# Patient Record
Sex: Male | Born: 1960 | Race: White | Hispanic: No | Marital: Single | Smoking: Never smoker
Health system: Southern US, Community
[De-identification: ages and names within clinical notes are randomized; demographics above are authoritative.]

## PROBLEM LIST (undated history)

## (undated) DIAGNOSIS — T4145XA Adverse effect of unspecified anesthetic, initial encounter: Secondary | ICD-10-CM

## (undated) DIAGNOSIS — G839 Paralytic syndrome, unspecified: Secondary | ICD-10-CM

## (undated) DIAGNOSIS — G809 Cerebral palsy, unspecified: Secondary | ICD-10-CM

## (undated) DIAGNOSIS — T8859XA Other complications of anesthesia, initial encounter: Secondary | ICD-10-CM

## (undated) DIAGNOSIS — R112 Nausea with vomiting, unspecified: Secondary | ICD-10-CM

## (undated) DIAGNOSIS — Z9889 Other specified postprocedural states: Secondary | ICD-10-CM

## (undated) HISTORY — PX: TENDON RELEASE: SHX230

---
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
## 2013-06-25 ENCOUNTER — Encounter (HOSPITAL_COMMUNITY): Payer: Self-pay | Admitting: Emergency Medicine

## 2013-06-25 ENCOUNTER — Emergency Department (HOSPITAL_COMMUNITY)
Admission: EM | Admit: 2013-06-25 | Discharge: 2013-06-26 | Disposition: A | Discharge status: Home / Self Care | Payer: Medicare Other | Attending: Emergency Medicine | Admitting: Emergency Medicine

## 2013-06-25 ENCOUNTER — Emergency Department (HOSPITAL_COMMUNITY): Payer: Medicare Other

## 2013-06-25 DIAGNOSIS — Z8669 Personal history of other diseases of the nervous system and sense organs: Secondary | ICD-10-CM | POA: Insufficient documentation

## 2013-06-25 DIAGNOSIS — R071 Chest pain on breathing: Secondary | ICD-10-CM | POA: Insufficient documentation

## 2013-06-25 DIAGNOSIS — R0789 Other chest pain: Secondary | ICD-10-CM

## 2013-06-25 DIAGNOSIS — J40 Bronchitis, not specified as acute or chronic: Secondary | ICD-10-CM

## 2013-06-25 HISTORY — DX: Cerebral palsy, unspecified: G80.9

## 2013-06-25 MED ORDER — KETOROLAC TROMETHAMINE 60 MG/2ML IM SOLN
60.0000 mg | Freq: Once | INTRAMUSCULAR | Status: AC
  Administered 2013-06-25: 60 mg via INTRAMUSCULAR
  Filled 2013-06-25: qty 2

## 2013-06-25 NOTE — ED Notes (Signed)
Pt c/o pain in his chest when he takes a deep breath. Pt has had a fever and non-productive cough.

## 2013-06-25 NOTE — ED Provider Notes (Signed)
CSN: [id number]     Arrival date & time 06/25/13  2119 History   First MD Initiated Contact with Patient 06/25/13 2306     Chief Complaint  Patient presents with  . chest congestion      (Consider location/radiation/quality/duration/timing/severity/associated sxs/prior Treatment) HPI Patient has a history of cerebral palsy and is nonverbal. He communicates with signs. He presents with a nonproductive cough x4-5 days. He's had no fevers or chills. He complains of central chest pain that is worse with deep breathing and coughing. Denies any lower sugary swelling or pain. Multiple sick family members with similar illnesses. She's taken several over-the-counter medications with no effect. Past Medical History  Diagnosis Date  . Cerebral palsy    History reviewed. No pertinent past surgical history. History reviewed. No pertinent family history. History  Substance Use Topics  . Smoking status: Never Smoker   . Smokeless tobacco: Not on file  . Alcohol Use: No    Review of Systems  Constitutional: Negative for fever and chills.  Respiratory: Positive for cough. Negative for shortness of breath and wheezing.   Cardiovascular: Positive for chest pain. Negative for palpitations and leg swelling.  Gastrointestinal: Negative for abdominal pain.  Musculoskeletal: Negative for back pain.  All other systems reviewed and are negative.     Allergies  Review of patient's allergies indicates no known allergies.  Home Medications   Prior to Admission medications   Medication Sig Start Date End Date Taking? Authorizing Provider  diphenhydrAMINE (BENADRYL) 25 mg capsule Take 25 mg by mouth every 6 (six) hours as needed.   Yes Historical Provider, MD  guaiFENesin-dextromethorphan (ROBITUSSIN DM) 100-10 MG/5ML syrup Take 10 mLs by mouth every 4 (four) hours as needed for cough.   Yes Historical Provider, MD   BP 142/101  Pulse 101  Temp(Src) 98.4 F (36.9 C) (Oral)  Resp 20  SpO2  97% Physical Exam  Nursing note and vitals reviewed. Constitutional: He appears well-developed and well-nourished. No distress.  Contracted extremities  HENT:  Head: Normocephalic and atraumatic.  Mouth/Throat: Oropharynx is clear and moist. No oropharyngeal exudate.  Eyes: EOM are normal. Pupils are equal, round, and reactive to light.  Neck: Normal range of motion. Neck supple.  Cardiovascular: Normal rate and regular rhythm.   Pulmonary/Chest: Effort normal and breath sounds normal. No respiratory distress. He has no wheezes. He has no rales. He exhibits tenderness (chest tenderness with palpation over the central chest.).  Abdominal: Soft. Bowel sounds are normal. He exhibits no distension and no mass. There is no tenderness. There is no rebound and no guarding.  Musculoskeletal: Normal range of motion. He exhibits no edema and no tenderness.  No calf swelling or tenderness.  Neurological: He is alert.  Follows commands. No focal deficit.  Skin: Skin is warm and dry. No rash noted. No erythema.  Psychiatric: He has a normal mood and affect. His behavior is normal.    ED Course  Procedures (including critical care time) Labs Review Labs Reviewed - No data to display  Imaging Review Dg Chest Portable 1 View  06/25/2013   CLINICAL DATA:  Chest congestion.  Cough.  History cerebral palsy.  EXAM: PORTABLE CHEST - 1 VIEW  COMPARISON:  None.  FINDINGS: Shallow inspiration with elevation of the right hemidiaphragm. The thoracic scoliosis convex towards the left. Normal heart size and pulmonary vascularity. No focal airspace disease or consolidation in the lungs. No blunting of costophrenic angles. No pneumothorax.  IMPRESSION: No active disease.   Electronically  Signed   By: Burman NievesWilliam  Stevens M.D.   On: 06/25/2013 23:27     EKG Interpretation None      Date: 06/26/2013  Rate: 100  Rhythm: normal sinus rhythm  QRS Axis: normal  Intervals: normal  ST/T Wave abnormalities: normal   Conduction Disutrbances:none  Narrative Interpretation:   Old EKG Reviewed: none available   MDM   Final diagnoses:  None    Patient with chest pain worse with palpation and deep breathing. He has had chest x-ray negative for evidence of pneumonia. Likely bronchitis with pleurisy versus musculoskeletal chest pain. I have very low suspicion for coronary artery disease. We'll screen with EKG.  No concerning changes on EKG. We'll treat with NSAIDs and antitussives. Return precautions given.  Loren Raceravid Reinhard Schack, MD 06/26/13 [phone]

## 2013-06-26 MED ORDER — BENZONATATE 100 MG PO CAPS: 100.0000 mg | ORAL_CAPSULE | Freq: Three times a day (TID) | ORAL | Status: DC

## 2013-06-26 MED ORDER — IBUPROFEN 600 MG PO TABS: 600.0000 mg | ORAL_TABLET | Freq: Three times a day (TID) | ORAL | Status: DC | PRN

## 2013-06-26 NOTE — ED Notes (Signed)
Pt cleaned of stool & urine. Pt placed in diaper & clean scrub pants

## 2013-06-26 NOTE — ED Notes (Signed)
Pt alert & oriented x4, stable gait. Parent given discharge instructions, paperwork & prescription(s). Parent instructed to stop at the registration desk to finish any additional paperwork. Parent verbalized understanding. Pt left department w/ no further questions. 

## 2013-06-26 NOTE — Discharge Instructions (Signed)
Bronchitis °Bronchitis is inflammation of the airways that extend from the windpipe into the lungs (bronchi). The inflammation often causes mucus to develop, which leads to a cough. If the inflammation becomes severe, it may cause shortness of breath. °CAUSES  °Bronchitis may be caused by:  °· Viral infections.   °· Bacteria.   °· Cigarette smoke.   °· Allergens, pollutants, and other irritants.   °SIGNS AND SYMPTOMS  °The most common symptom of bronchitis is a frequent cough that produces mucus. Other symptoms include: °· Fever.   °· Body aches.   °· Chest congestion.   °· Chills.   °· Shortness of breath.   °· Sore throat.   °DIAGNOSIS  °Bronchitis is usually diagnosed through a medical history and physical exam. Tests, such as chest X-rays, are sometimes done to rule out other conditions.  °TREATMENT  °You may need to avoid contact with whatever caused the problem (smoking, for example). Medicines are sometimes needed. These may include: °· Antibiotics. These may be prescribed if the condition is caused by bacteria. °· Cough suppressants. These may be prescribed for relief of cough symptoms.   °· Inhaled medicines. These may be prescribed to help open your airways and make it easier for you to breathe.   °· Steroid medicines. These may be prescribed for those with recurrent (chronic) bronchitis. °HOME CARE INSTRUCTIONS °· Get plenty of rest.   °· Drink enough fluids to keep your urine clear or pale yellow (unless you have a medical condition that requires fluid restriction). Increasing fluids may help thin your secretions and will prevent dehydration.   °· Only take over-the-counter or prescription medicines as directed by your health care provider. °· Only take antibiotics as directed. Make sure you finish them even if you start to feel better. °· Avoid secondhand smoke, irritating chemicals, and strong fumes. These will make bronchitis worse. If you are a smoker, quit smoking. Consider using nicotine gum or  skin patches to help control withdrawal symptoms. Quitting smoking will help your lungs heal faster.   °· Put a cool-mist humidifier in your bedroom at night to moisten the air. This may help loosen mucus. Change the water in the humidifier daily. You can also run the hot water in your shower and sit in the bathroom with the door closed for 5 10 minutes.   °· Follow up with your health care provider as directed.   °· Wash your hands frequently to avoid catching bronchitis again or spreading an infection to others.   °SEEK MEDICAL CARE IF: °Your symptoms do not improve after 1 week of treatment.  °SEEK IMMEDIATE MEDICAL CARE IF: °· Your fever increases. °· You have chills.   °· You have chest pain.   °· You have worsening shortness of breath.   °· You have bloody sputum. °· You faint.   °· You have lightheadedness. °· You have a severe headache.   °· You vomit repeatedly. °MAKE SURE YOU:  °· Understand these instructions. °· Will watch your condition. °· Will get help right away if you are not doing well or get worse. °Document Released: 02/10/2005 Document Revised: 12/01/2012 Document Reviewed: 10/05/2012 °Exit[employer]® Patient Information ©2014 [employer]. ° °Chest Wall Pain °Chest wall pain is pain in or around the bones and muscles of your chest. It may take up to 6 weeks to get better. It may take longer if you must stay physically active in your work and activities.  °CAUSES  °Chest wall pain may happen on its own. However, it may be caused by: °· A viral illness like the flu. °· Injury. °· Coughing. °· Exercise. °· Arthritis. °·   Fibromyalgia. °· Shingles. °HOME CARE INSTRUCTIONS  °· Avoid overtiring physical activity. Try not to strain or perform activities that cause pain. This includes any activities using your chest or your abdominal and side muscles, especially if heavy weights are used. °· Put ice on the sore area. °· Put ice in a plastic bag. °· Place a towel between your skin and the bag. °· Leave the  ice on for 15-20 minutes per hour while awake for the first 2 days. °· Only take over-the-counter or prescription medicines for pain, discomfort, or fever as directed by your caregiver. °SEEK IMMEDIATE MEDICAL CARE IF:  °· Your pain increases, or you are very uncomfortable. °· You have a fever. °· Your chest pain becomes worse. °· You have new, unexplained symptoms. °· You have nausea or vomiting. °· You feel sweaty or lightheaded. °· You have a cough with phlegm (sputum), or you cough up blood. °MAKE SURE YOU:  °· Understand these instructions. °· Will watch your condition. °· Will get help right away if you are not doing well or get worse. °Document Released: 02/10/2005 Document Revised: 05/05/2011 Document Reviewed: 10/07/2010 °ExitCare® Patient Information ©2014 [employer]. ° °

## 2014-11-24 ENCOUNTER — Encounter (HOSPITAL_COMMUNITY): Payer: Self-pay | Admitting: Emergency Medicine

## 2014-11-24 ENCOUNTER — Emergency Department (HOSPITAL_COMMUNITY)
Admission: EM | Admit: 2014-11-24 | Discharge: 2014-11-24 | Disposition: A | Discharge status: Home / Self Care | Payer: Medicare Other | Attending: Emergency Medicine | Admitting: Emergency Medicine

## 2014-11-24 DIAGNOSIS — N390 Urinary tract infection, site not specified: Secondary | ICD-10-CM | POA: Insufficient documentation

## 2014-11-24 DIAGNOSIS — R319 Hematuria, unspecified: Secondary | ICD-10-CM | POA: Diagnosis present

## 2014-11-24 DIAGNOSIS — G809 Cerebral palsy, unspecified: Secondary | ICD-10-CM | POA: Insufficient documentation

## 2014-11-24 LAB — URINALYSIS, ROUTINE W REFLEX MICROSCOPIC
Bilirubin Urine: NEGATIVE
GLUCOSE, UA: NEGATIVE mg/dL
KETONES UR: NEGATIVE mg/dL
Nitrite: POSITIVE — AB
Protein, ur: 30 mg/dL — AB
Specific Gravity, Urine: 1.015 (ref 1.005–1.030)
UROBILINOGEN UA: 0.2 mg/dL (ref 0.0–1.0)
pH: 7.5 (ref 5.0–8.0)

## 2014-11-24 LAB — URINE MICROSCOPIC-ADD ON

## 2014-11-24 MED ORDER — CEFTRIAXONE SODIUM 1 G IJ SOLR
1.0000 g | Freq: Once | INTRAMUSCULAR | Status: AC
  Administered 2014-11-24: 1 g via INTRAMUSCULAR
  Filled 2014-11-24: qty 10

## 2014-11-24 MED ORDER — LIDOCAINE HCL (PF) 1 % IJ SOLN
INTRAMUSCULAR | Status: AC
  Filled 2014-11-24: qty 5

## 2014-11-24 MED ORDER — CEPHALEXIN 500 MG PO CAPS: 500.0000 mg | ORAL_CAPSULE | Freq: Four times a day (QID) | ORAL | Status: DC

## 2014-11-24 NOTE — Discharge Instructions (Signed)

## 2014-11-24 NOTE — ED Notes (Signed)
Caregiver reports pt developed hematuria yesterday.

## 2014-11-24 NOTE — ED Provider Notes (Signed)
CSN: [id number]     Arrival date & time 11/24/14  1306 History   First MD Initiated Contact with Patient 11/24/14 1525     Chief Complaint  Patient presents with  . Hematuria     (Consider location/radiation/quality/duration/timing/severity/associated sxs/prior Treatment) HPI Comments: Brought to the emergency department by caregiver. Patient developed hematuria yesterday. He has been complaining of lower back pain. Patient has not had fever, nausea, vomiting or diarrhea.  Patient is a 54 y.o. male presenting with hematuria.  Hematuria Pertinent negatives include no abdominal pain.    Past Medical History  Diagnosis Date  . Cerebral palsy    Past Surgical History  Procedure Laterality Date  . Tendon release     Family History  Problem Relation Age of Onset  . Diabetes Mother   . Heart failure Mother   . Hypertension Mother   . Diabetes Father   . Heart failure Father   . Hypertension Father   . Cancer Father    Social History  Substance Use Topics  . Smoking status: Never Smoker   . Smokeless tobacco: None  . Alcohol Use: No    Review of Systems  Gastrointestinal: Negative for abdominal pain.  Genitourinary: Positive for hematuria.  Musculoskeletal: Positive for back pain.  All other systems reviewed and are negative.     Allergies  Review of patient's allergies indicates no known allergies.  Home Medications   Prior to Admission medications   Not on File   BP 114/77 mmHg  Pulse 69  Temp(Src) 97.5 F (36.4 C)  Resp 20  Ht  (1.651 m)  Wt 115 lb (52.164 kg)  BMI 19.14 kg/m2  SpO2 98% Physical Exam  Constitutional: He appears well-developed and well-nourished. No distress.  HENT:  Head: Normocephalic and atraumatic.  Right Ear: Hearing normal.  Left Ear: Hearing normal.  Nose: Nose normal.  Mouth/Throat: Oropharynx is clear and moist and mucous membranes are normal.  Eyes: Conjunctivae and EOM are normal. Pupils are equal, round, and  reactive to light.  Neck: Normal range of motion. Neck supple.  Cardiovascular: Regular rhythm, S1 normal and S2 normal.  Exam reveals no gallop and no friction rub.   No murmur heard. Pulmonary/Chest: Effort normal and breath sounds normal. No respiratory distress. He exhibits no tenderness.  Abdominal: Soft. Normal appearance and bowel sounds are normal. There is no hepatosplenomegaly. There is no tenderness. There is no rebound, no guarding, no tenderness at McBurney's point and negative Murphy's sign. No hernia.  Musculoskeletal:  Chronic contractures of cerebral palsy  Neurological: He is alert. He has normal strength. No cranial nerve deficit or sensory deficit. Coordination normal. GCS eye subscore is 4. GCS verbal subscore is 5. GCS motor subscore is 6.  Skin: Skin is warm, dry and intact. No rash noted. No cyanosis.  Psychiatric: He has a normal mood and affect. His speech is normal and behavior is normal. Thought content normal.  Nursing note and vitals reviewed.   ED Course  Procedures (including critical care time) Labs Review Labs Reviewed  URINALYSIS, ROUTINE W REFLEX MICROSCOPIC (NOT AT [facility]) - Abnormal; Notable for the following:    Hgb urine dipstick LARGE (*)    Protein, ur 30 (*)    Nitrite POSITIVE (*)    Leukocytes, UA LARGE (*)    All other components within normal limits  URINE MICROSCOPIC-ADD ON - Abnormal; Notable for the following:    Squamous Epithelial / LPF FEW (*)    Bacteria, UA FEW (*)  All other components within normal limits    Imaging Review No results found. I have personally reviewed and evaluated these images and lab results as part of my medical decision-making.   EKG Interpretation None      MDM   Final diagnoses:  None   UTI  Presents to the ER for evaluation of hematuria and low back pain. Patient appears well. Vital signs are normal. No fever. He has not had nausea, vomiting. Abdominal exam was benign. Urinalysis shows obvious  infection. Culture pending. Patient is to Rocephin, will continue Keflex as an outpatient.    Gilda Crease, MD 11/24/14 [phone]

## 2014-11-29 LAB — URINE CULTURE: Culture: >=100000

## 2014-11-30 ENCOUNTER — Telehealth (HOSPITAL_BASED_OUTPATIENT_CLINIC_OR_DEPARTMENT_OTHER): Payer: Self-pay | Admitting: Emergency Medicine

## 2014-11-30 NOTE — Progress Notes (Addendum)
ED Antimicrobial Stewardship Positive Culture Follow Up   [name] is an 54 y.o. male who presented to [facility] on 11/24/2014 with a chief complaint of  Chief Complaint  Patient presents with  . Hematuria    Recent Results (from the past 720 hour(s))  Urine culture     Status: None   Collection Time: 11/24/14 10:00 PM  Result Value Ref Range Status   Specimen Description URINE, CATHETERIZED  Final   Special Requests NONE  Final   Culture   Final    >=100,000 COLONIES/mL PROTEUS MIRABILIS Confirmed Extended Spectrum Beta-Lactamase Producer (ESBL) Performed at [facility]    Report Status 11/29/2014 FINAL  Final   Organism ID, Bacteria PROTEUS MIRABILIS  Final      Susceptibility   Proteus mirabilis - MIC*    AMPICILLIN 4 RESISTANT Resistant     CEFAZOLIN <=4 RESISTANT Resistant     CEFTRIAXONE <=1 RESISTANT Resistant     CIPROFLOXACIN <=0.25 SENSITIVE Sensitive     GENTAMICIN <=1 SENSITIVE Sensitive     IMIPENEM 0.5 SENSITIVE Sensitive     NITROFURANTOIN 128 RESISTANT Resistant     TRIMETH/SULFA <=20 SENSITIVE Sensitive     AMPICILLIN/SULBACTAM 4 SENSITIVE Sensitive     PIP/TAZO <=4 SENSITIVE Sensitive     * >=100,000 COLONIES/mL PROTEUS MIRABILIS     Treated with Keflex, organism resistant to prescribed antimicrobial  New antibiotic prescription: Bactrim DS 1 tab q 12 hrs x 7 days  54 y/o M with hematuria and lower back pain. UA with nitrite, large leukocytes, and few squamous. Suggestive of UTI. Grew Proteus with ESBL resistance. Pt d/c'd with Keflex. Plan to d/c Keflex and start Bactrim for proteus UTI. Will treat for longer course of 7 days due to complicated UTI due to male gender.   ED Provider: Donnetta Hutching, MD   Sandi Carne, PharmD Pharmacy Resident Pager: [phone] 11/30/2014, 9:14 AM Infectious Diseases Pharmacist Phone# [phone]

## 2014-11-30 NOTE — Telephone Encounter (Signed)
Message left with brother in law for sister, POA , to return call, urine culture + Proteus, resistant to Keflex, stop keflex, start Bactrim DS 1 tab every 12 hours x 7 days per Dr. Donnetta Hutching

## 2014-12-01 ENCOUNTER — Telehealth (HOSPITAL_COMMUNITY): Payer: Self-pay

## 2014-12-01 NOTE — Telephone Encounter (Signed)
Spoke w/caregiver informed of dx and need for addl tx. Advised to stop Keflex.  Rx for "Bactrim DS 1 tab q 12 hrs x 7 days" called into [facility] [phone].

## 2015-02-26 ENCOUNTER — Emergency Department (HOSPITAL_COMMUNITY): Payer: Medicare Other

## 2015-02-26 ENCOUNTER — Emergency Department (HOSPITAL_COMMUNITY)
Admission: EM | Admit: 2015-02-26 | Discharge: 2015-02-26 | Disposition: A | Discharge status: Home / Self Care | Payer: Medicare Other | Attending: Emergency Medicine | Admitting: Emergency Medicine

## 2015-02-26 ENCOUNTER — Encounter (HOSPITAL_COMMUNITY): Payer: Self-pay | Admitting: *Deleted

## 2015-02-26 DIAGNOSIS — Y929 Unspecified place or not applicable: Secondary | ICD-10-CM | POA: Diagnosis not present

## 2015-02-26 DIAGNOSIS — X58XXXA Exposure to other specified factors, initial encounter: Secondary | ICD-10-CM | POA: Insufficient documentation

## 2015-02-26 DIAGNOSIS — G809 Cerebral palsy, unspecified: Secondary | ICD-10-CM | POA: Diagnosis not present

## 2015-02-26 DIAGNOSIS — Y998 Other external cause status: Secondary | ICD-10-CM | POA: Diagnosis not present

## 2015-02-26 DIAGNOSIS — R509 Fever, unspecified: Secondary | ICD-10-CM | POA: Diagnosis present

## 2015-02-26 DIAGNOSIS — S42202A Unspecified fracture of upper end of left humerus, initial encounter for closed fracture: Secondary | ICD-10-CM

## 2015-02-26 DIAGNOSIS — Y939 Activity, unspecified: Secondary | ICD-10-CM | POA: Insufficient documentation

## 2015-02-26 DIAGNOSIS — S42292A Other displaced fracture of upper end of left humerus, initial encounter for closed fracture: Secondary | ICD-10-CM | POA: Diagnosis not present

## 2015-02-26 DIAGNOSIS — J069 Acute upper respiratory infection, unspecified: Secondary | ICD-10-CM | POA: Diagnosis not present

## 2015-02-26 LAB — LACTIC ACID, PLASMA
LACTIC ACID, VENOUS: 1.1 mmol/L (ref 0.5–2.0)
Lactic Acid, Venous: 0.9 mmol/L (ref 0.5–2.0)

## 2015-02-26 LAB — CBC WITH DIFFERENTIAL/PLATELET
BASOS ABS: 0 10*3/uL (ref 0.0–0.1)
BASOS PCT: 0 %
Eosinophils Absolute: 0 10*3/uL (ref 0.0–0.7)
Eosinophils Relative: 0 %
HCT: 42.5 % (ref 39.0–52.0)
HEMOGLOBIN: 13.9 g/dL (ref 13.0–17.0)
LYMPHS PCT: 7 %
Lymphs Abs: 0.7 10*3/uL (ref 0.7–4.0)
MCH: 29.8 pg (ref 26.0–34.0)
MCHC: 32.7 g/dL (ref 30.0–36.0)
MCV: 91.2 fL (ref 78.0–100.0)
MONO ABS: 1.3 10*3/uL — AB (ref 0.1–1.0)
Monocytes Relative: 14 %
NEUTROS ABS: 7.4 10*3/uL (ref 1.7–7.7)
NEUTROS PCT: 79 %
Platelets: 202 10*3/uL (ref 150–400)
RBC: 4.66 MIL/uL (ref 4.22–5.81)
RDW: 13.6 % (ref 11.5–15.5)
WBC: 9.3 10*3/uL (ref 4.0–10.5)

## 2015-02-26 LAB — COMPREHENSIVE METABOLIC PANEL
ALBUMIN: 3.7 g/dL (ref 3.5–5.0)
ALK PHOS: 75 U/L (ref 38–126)
ALT: 15 U/L — AB (ref 17–63)
AST: 20 U/L (ref 15–41)
Anion gap: 8 (ref 5–15)
BUN: 20 mg/dL (ref 6–20)
CALCIUM: 8.9 mg/dL (ref 8.9–10.3)
CO2: 27 mmol/L (ref 22–32)
CREATININE: 0.62 mg/dL (ref 0.61–1.24)
Chloride: 100 mmol/L — ABNORMAL LOW (ref 101–111)
GFR calc Af Amer: >60 mL/min (ref >60)
Glucose, Bld: 104 mg/dL — ABNORMAL HIGH (ref 65–99)
POTASSIUM: 3.8 mmol/L (ref 3.5–5.1)
Sodium: 135 mmol/L (ref 135–145)
TOTAL PROTEIN: 7.5 g/dL (ref 6.5–8.1)
Total Bilirubin: 0.6 mg/dL (ref 0.3–1.2)

## 2015-02-26 MED ORDER — DOXYCYCLINE HYCLATE 100 MG PO CAPS: 100.0000 mg | ORAL_CAPSULE | Freq: Two times a day (BID) | ORAL | Status: DC

## 2015-02-26 NOTE — ED Notes (Addendum)
Family reports cough, runny nose, fever since yesterday. Temp 100.5 in triage. Pt has cerebral palsy.

## 2015-02-26 NOTE — Discharge Instructions (Signed)

## 2015-02-26 NOTE — ED Provider Notes (Signed)
CSN: [id number]     Arrival date & time 02/26/15  1021 History   First MD Initiated Contact with Patient 02/26/15 1254     Chief Complaint  Patient presents with  . Fever   Patient is a 55 y.o. male presenting with fever and cough. The history is provided by the patient and a relative.  Fever Associated symptoms: chills and cough   Cough Cough characteristics:  Dry Severity:  Moderate Onset quality:  Gradual Duration:  1 week Timing:  Constant Progression since onset: not gettomg any better. Chronicity:  New Associated symptoms: chills and fever   Associated symptoms: no wheezing     Past Medical History  Diagnosis Date  . Cerebral palsy [facility](HCC)    Past Surgical History  Procedure Laterality Date  . Tendon release     Family History  Problem Relation Age of Onset  . Diabetes Mother   . Heart failure Mother   . Hypertension Mother   . Diabetes Father   . Heart failure Father   . Hypertension Father   . Cancer Father    Social History  Substance Use Topics  . Smoking status: Never Smoker   . Smokeless tobacco: None  . Alcohol Use: No    Review of Systems  Constitutional: Positive for fever and chills.  Respiratory: Positive for cough. Negative for wheezing.   All other systems reviewed and are negative.     Allergies  Review of patient's allergies indicates no known allergies.  Home Medications   Prior to Admission medications   Medication Sig Start Date End Date Taking? Authorizing Provider  doxycycline (VIBRAMYCIN) 100 MG capsule Take 1 capsule (100 mg total) by mouth 2 (two) times daily. 02/26/15   Linwood DibblesJon Thunder [place], MD   BP 107/96 mmHg  Pulse 96  Temp(Src) 100.5 F (38.1 C) (Temporal)  Resp 18  Ht 5\' 8"  (1.727 m)  Wt 52.164 kg  BMI 17.49 kg/m2  SpO2 98% Physical Exam  Constitutional: He appears well-developed and well-nourished. No distress.  HENT:  Head: Normocephalic and atraumatic.  Right Ear: External ear normal.  Left Ear: External ear normal.   Eyes: Conjunctivae are normal. Right eye exhibits no discharge. Left eye exhibits no discharge. No scleral icterus.  Neck: Neck supple. No tracheal deviation present.  Cardiovascular: Normal rate, regular rhythm and intact distal pulses.   Pulmonary/Chest: Effort normal and breath sounds normal. No stridor. No respiratory distress. He has no wheezes. He has no rales.  Abdominal: Soft. Bowel sounds are normal. He exhibits no distension. There is no tenderness. There is no rebound and no guarding.  Musculoskeletal: He exhibits no edema.       Left shoulder: He exhibits tenderness.  cpntractures upper rextrem   Neurological: He is alert. He displays atrophy. No cranial nerve deficit (no facial droop, extraocular movements intact, no slurred speech) or sensory deficit. He exhibits normal muscle tone. He displays no seizure activity. Coordination normal.  spasticity  Skin: Skin is warm and dry. No rash noted.  Psychiatric: He has a normal mood and affect.  Nursing note and vitals reviewed.   ED Course  Procedures (including critical care time) Labs Review Labs Reviewed  CBC WITH DIFFERENTIAL/PLATELET - Abnormal; Notable for the following:    Monocytes Absolute 1.3 (*)    All other components within normal limits  COMPREHENSIVE METABOLIC PANEL - Abnormal; Notable for the following:    Chloride 100 (*)    Glucose, Bld 104 (*)    ALT 15 (*)  All other components within normal limits  LACTIC ACID, PLASMA  LACTIC ACID, PLASMA    Imaging Review Dg Chest 2 View  02/26/2015  CLINICAL DATA:  55 year old male with cough and fever for 1 day. Initial encounter. EXAM: CHEST  2 VIEW COMPARISON:  06/25/2013 FINDINGS: The cardiomediastinal silhouette is unremarkable. Mild peribronchial thickening is unchanged. There is no evidence of focal airspace disease, pulmonary edema, suspicious pulmonary nodule/mass, pleural effusion, or pneumothorax. There is mild irregularity of the proximal humerus which  could be positional but a fracture is not excluded. IMPRESSION: No evidence of acute cardiopulmonary disease. Irregularity of the proximal left humerus which may be positional but recommend dedicated images as clinically correlated with pain. Mild chronic peribronchial thickening. Electronically Signed   By: Harmon Pier M.D.   On: 02/26/2015 11:27   Dg Shoulder Left  02/26/2015  CLINICAL DATA:  Abnormal left shoulder on chest x-ray earlier today. EXAM: LEFT SHOULDER - 2+ VIEW COMPARISON:  Chest x-ray earlier today. FINDINGS: Two-view exam left shoulder shows deformity of the humeral neck consistent with fracture, age indeterminate, but likely chronic. Bones are diffusely demineralized. IMPRESSION: Left humeral neck fracture, likely chronic. Electronically Signed   By: Kennith Center M.D.   On: 02/26/2015 14:09     MDM   Final diagnoses:  URI, acute  Proximal humerus fracture, left, closed, initial encounter    CXR does not show pna.  1 week of symptoms.  Considering his persistent sx and Cerebral palsy will rx docycycline.  Pt does not recall any injury in the past.  Chronic left humeral neck fracture.  Pt has limited use of his extremities with is CP.  Sling will cause more discomfort.  Follow up with PCP prn    Linwood Dibbles, MD 02/26/15 [phone]

## 2015-02-26 NOTE — ED Notes (Signed)
Family states non-productive cough x 4-5 days. Fever noticed yesterday. Also states upper back discomfort as well.

## 2015-05-08 IMAGING — CR DG CHEST 1V PORT
1 series · 1 of 1 positions shown · non-contrast
Comparison: None.

CLINICAL DATA: Chest congestion.  Cough.  History cerebral palsy.

EXAM:
PORTABLE CHEST - 1 VIEW

[portable]
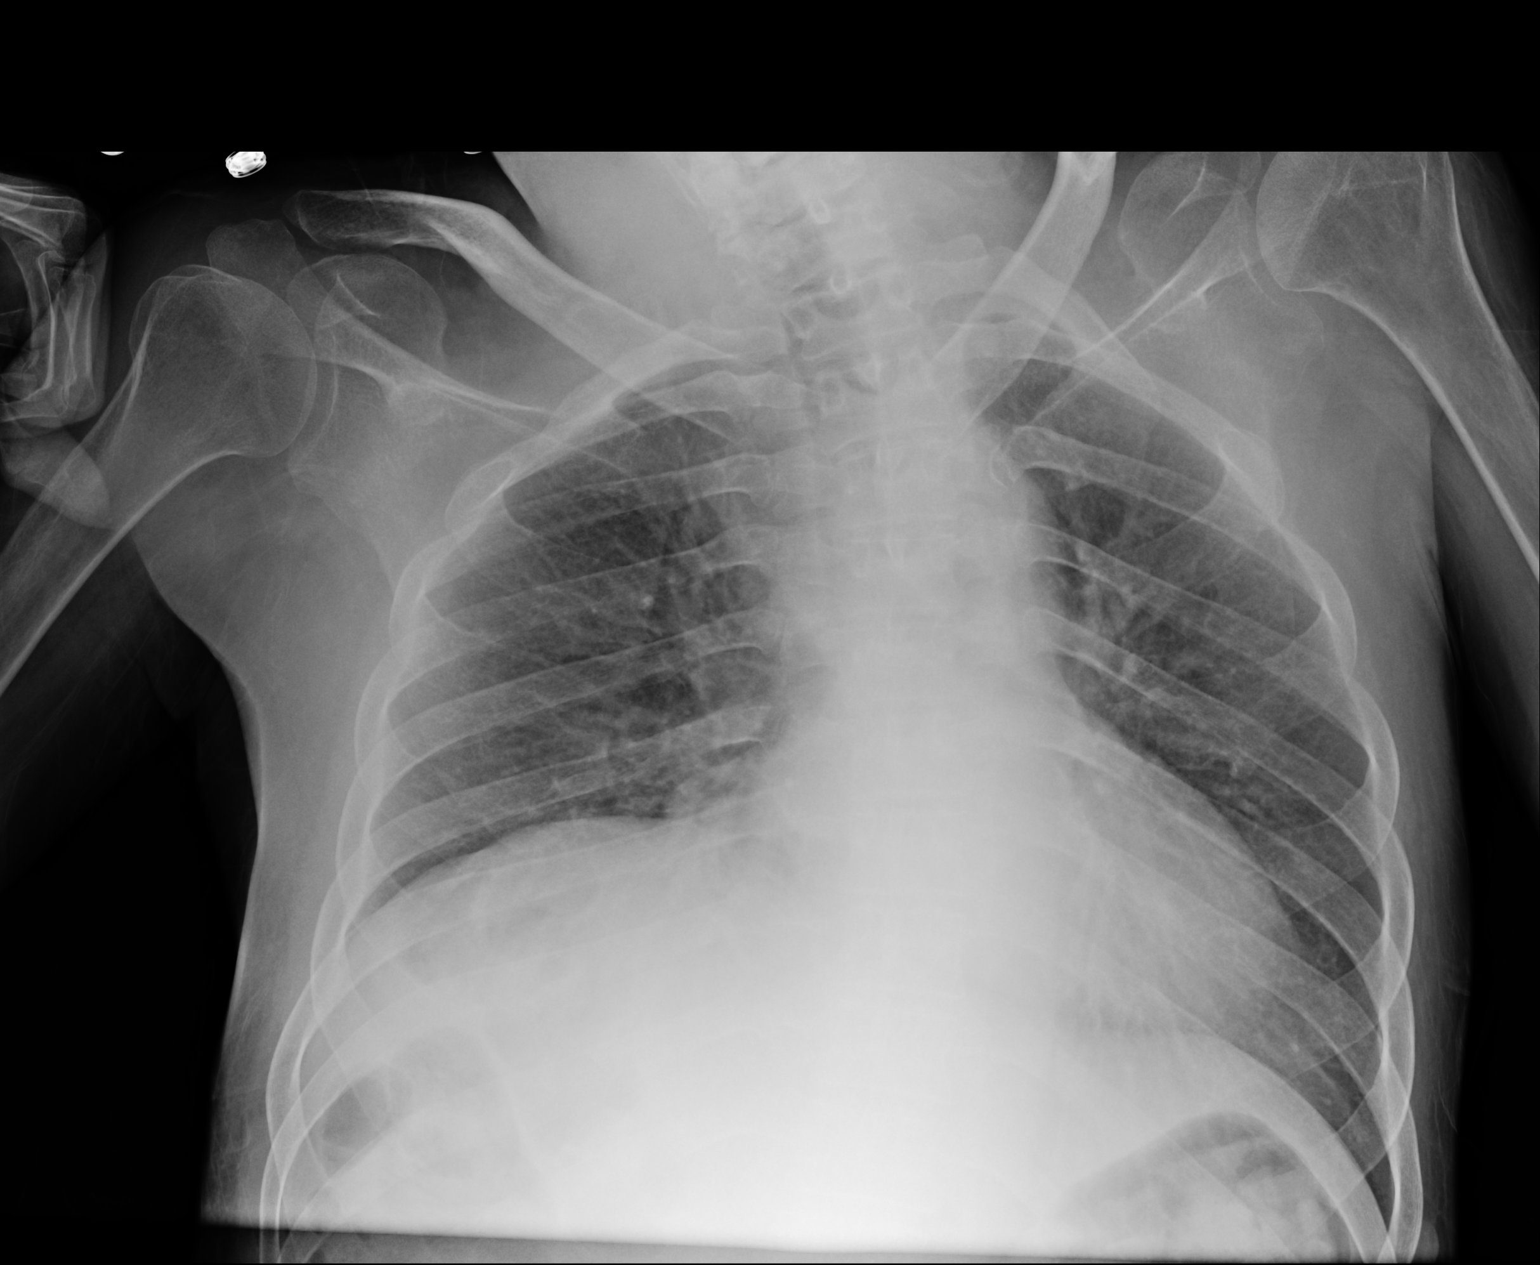

[1 of 1 positions shown; findings below may reference images not displayed]

FINDINGS: Shallow inspiration with elevation of the right hemidiaphragm. The
thoracic scoliosis convex towards the left. Normal heart size and
pulmonary vascularity. No focal airspace disease or consolidation in
the lungs. No blunting of costophrenic angles. No pneumothorax.
IMPRESSION: No active disease.

## 2017-05-01 ENCOUNTER — Ambulatory Visit: Payer: Medicare Other | Admitting: Family Medicine

## 2017-06-30 ENCOUNTER — Ambulatory Visit: Payer: Medicare Other | Admitting: Family Medicine

## 2017-08-06 ENCOUNTER — Ambulatory Visit: Payer: Medicare Other | Admitting: Family Medicine

## 2018-10-30 ENCOUNTER — Emergency Department (HOSPITAL_COMMUNITY)
Admission: EM | Admit: 2018-10-30 | Discharge: 2018-10-30 | Disposition: A | Discharge status: Home / Self Care | Payer: Medicare Other | Attending: Emergency Medicine | Admitting: Emergency Medicine

## 2018-10-30 ENCOUNTER — Encounter (HOSPITAL_COMMUNITY): Payer: Self-pay | Admitting: Emergency Medicine

## 2018-10-30 ENCOUNTER — Other Ambulatory Visit: Payer: Self-pay

## 2018-10-30 DIAGNOSIS — R319 Hematuria, unspecified: Secondary | ICD-10-CM | POA: Diagnosis present

## 2018-10-30 DIAGNOSIS — G809 Cerebral palsy, unspecified: Secondary | ICD-10-CM | POA: Insufficient documentation

## 2018-10-30 LAB — CBC WITH DIFFERENTIAL/PLATELET
Abs Immature Granulocytes: 0.02 10*3/uL (ref 0.00–0.07)
Basophils Absolute: 0 10*3/uL (ref 0.0–0.1)
Basophils Relative: 1 %
Eosinophils Absolute: 0.1 10*3/uL (ref 0.0–0.5)
Eosinophils Relative: 1 %
HCT: 39.7 % (ref 39.0–52.0)
Hemoglobin: 11.8 g/dL — ABNORMAL LOW (ref 13.0–17.0)
Immature Granulocytes: 0 %
Lymphocytes Relative: 17 %
Lymphs Abs: 1.2 10*3/uL (ref 0.7–4.0)
MCH: 27.9 pg (ref 26.0–34.0)
MCHC: 29.7 g/dL — ABNORMAL LOW (ref 30.0–36.0)
MCV: 93.9 fL (ref 80.0–100.0)
Monocytes Absolute: 0.3 10*3/uL (ref 0.1–1.0)
Monocytes Relative: 4 %
Neutro Abs: 5.6 10*3/uL (ref 1.7–7.7)
Neutrophils Relative %: 77 %
Platelets: 374 10*3/uL (ref 150–400)
RBC: 4.23 MIL/uL (ref 4.22–5.81)
RDW: 13 % (ref 11.5–15.5)
WBC: 7.2 10*3/uL (ref 4.0–10.5)
nRBC: 0 % (ref 0.0–0.2)

## 2018-10-30 LAB — POC OCCULT BLOOD, ED: Fecal Occult Bld: NEGATIVE

## 2018-10-30 LAB — BASIC METABOLIC PANEL
Anion gap: 10 (ref 5–15)
BUN: 38 mg/dL — ABNORMAL HIGH (ref 6–20)
CO2: 23 mmol/L (ref 22–32)
Calcium: 9.1 mg/dL (ref 8.9–10.3)
Chloride: 111 mmol/L (ref 98–111)
Creatinine, Ser: 0.99 mg/dL (ref 0.61–1.24)
GFR calc Af Amer: >60 mL/min (ref >60)
GFR calc non Af Amer: >60 mL/min (ref >60)
Glucose, Bld: 102 mg/dL — ABNORMAL HIGH (ref 70–99)
Potassium: 4 mmol/L (ref 3.5–5.1)
Sodium: 144 mmol/L (ref 135–145)

## 2018-10-30 MED ORDER — LIDOCAINE HCL URETHRAL/MUCOSAL 2 % EX GEL: 1.0000 "application " | Freq: Once | CUTANEOUS | Status: DC

## 2018-10-30 NOTE — ED Provider Notes (Signed)
[facility] EMERGENCY DEPARTMENT Provider Note   CSN: [id number] Arrival date & time: 10/30/18  1235     History   Chief Complaint Chief Complaint  Patient presents with  . Hematuria    HPI [name] is a 58 y.o. male.  Patient sister is the historian since the patient cannot speak.     HPI   He presents for evaluation of blood in his diaper, this morning.  Patient has severe cerebral palsy, and is cared for by his sister and her son.  This morning his nephew noticed the blood, when he changed the diaper.  This has been a single episode. His sister suspects that he has a UTI.  She does not believe there is been blood in the stool.  The patient is cognizant but cannot speak secondary to his cerebral palsy.  He has upper and lower extremity contractures, but is able to get around in a wheelchair, with assistance.  There is been no fever, vomiting, or other clear signs of problems.  His sister reports that he is not as interactive as usual.  There are no other known modifying factors.  Past Medical History:  Diagnosis Date  . Cerebral palsy (HCC)     There are no active problems to display for this patient.   Past Surgical History:  Procedure Laterality Date  . TENDON RELEASE          Home Medications    Prior to Admission medications   Not on File    Family History Family History  Problem Relation Age of Onset  . Diabetes Mother   . Heart failure Mother   . Hypertension Mother   . Diabetes Father   . Heart failure Father   . Hypertension Father   . Cancer Father     Social History Social History   Tobacco Use  . Smoking status: Never Smoker  . Smokeless tobacco: Never Used  Substance Use Topics  . Alcohol use: No  . Drug use: No     Allergies   Patient has no known allergies.   Review of Systems Review of Systems  All other systems reviewed and are negative.    Physical Exam Updated Vital Signs BP 120/90 (BP Location: Right Arm)    Pulse 92   Temp 98.9 F (37.2 C) (Axillary)   Resp 18   Ht 5\' 5"  (1.651 m)   Wt 52.2 kg   SpO2 97%   BMI 19.15 kg/m   Physical Exam Vitals signs and nursing note reviewed.  Constitutional:      General: He is not in acute distress.    Appearance: He is well-developed. He is not ill-appearing, toxic-appearing or diaphoretic.  HENT:     Head: Normocephalic and atraumatic.     Right Ear: External ear normal.     Left Ear: External ear normal.  Eyes:     Conjunctiva/sclera: Conjunctivae normal.     Pupils: Pupils are equal, round, and reactive to light.  Neck:     Musculoskeletal: Normal range of motion and neck supple.     Trachea: Phonation normal.  Cardiovascular:     Rate and Rhythm: Normal rate and regular rhythm.     Heart sounds: Normal heart sounds.  Pulmonary:     Effort: Pulmonary effort is normal.     Breath sounds: Normal breath sounds.  Abdominal:     General: There is no distension.     Palpations: Abdomen is soft. There is no  mass.     Tenderness: There is no abdominal tenderness. There is no guarding.     Hernia: No hernia is present.  Genitourinary:    Comments: External genital exam-tip of the penis is visible with clear urine on it, but it is enveloped with surrounding skin.  Scrotum appears normal.  When the penis and shaft are uncovered by pressing on the surrounding tissue, there is a moderate amount of smegma, that has been collected in the space around the penis.  This was easily removed.  There was no associated skin wound or bleeding at the site.  There is no blood in the diaper.  Anus appears normal.  No visible external hemorrhoid.  Rectal exam reveals brown stool, moderate amount, but no fecal impaction.  No palpable rectal abnormality. Musculoskeletal:     Comments: Arm and leg contractures, arms more than legs.  Skin:    General: Skin is warm and dry.  Neurological:     Mental Status: He is alert.     Cranial Nerves: No cranial nerve deficit.      Sensory: No sensory deficit.     Motor: No abnormal muscle tone.  Psychiatric:        Mood and Affect: Mood normal.        Behavior: Behavior normal.      ED Treatments / Results  Labs (all labs ordered are listed, but only abnormal results are displayed) Labs Reviewed  BASIC METABOLIC PANEL - Abnormal; Notable for the following components:      Result Value   Glucose, Bld 102 (*)    BUN 38 (*)    All other components within normal limits  CBC WITH DIFFERENTIAL/PLATELET - Abnormal; Notable for the following components:   Hemoglobin 11.8 (*)    MCHC 29.7 (*)    All other components within normal limits  URINE CULTURE  URINALYSIS, ROUTINE W REFLEX MICROSCOPIC  POC OCCULT BLOOD, ED    EKG None  Radiology No results found.  Procedures BLADDER CATHETERIZATION  Date/Time: 10/30/2018 3:22 PM Performed by: Daleen Bo, MD Authorized by: Daleen Bo, MD   Consent:    Consent obtained:  Verbal   Consent given by:  Guardian   Risks discussed:  Infection and pain   Alternatives discussed:  No treatment Pre-procedure details:    Procedure purpose:  Diagnostic   Preparation: Patient was prepped and draped in usual sterile fashion   Anesthesia (see MAR for exact dosages):    Anesthesia method:  Topical application   Topical anesthetic:  Lidocaine gel Procedure details:    Provider performed due to:  Nurse unable to complete   Catheter type:  Coude   Catheter size:  16 Fr   Bladder irrigation: no   Comments:     Unable to pass coud catheter, resistance encountered at the level of prostate, mild bleeding, and urethra post procedure.  Will contact urology for help with disposition.   (including critical care time)  Medications Ordered in ED Medications  lidocaine (XYLOCAINE) 2 % jelly 1 application (has no administration in time range)     Initial Impression / Assessment and Plan / ED Course  I have reviewed the triage vital signs and the nursing notes.   Pertinent labs & imaging results that were available during my care of the patient were reviewed by me and considered in my medical decision making (see chart for details).  Clinical Course as of Oct 29 1524  Sat Oct 30, 2018  1524 Normal  except mild elevation of glucose and BUN  Basic metabolic panel(!) [EW]  1524 Normal except hemoglobin slightly low  CBC with Differential(!) [EW]  1524 Normal  POC occult blood, ED Provider will collect [EW]    Clinical Course User Index [EW] Mancel BaleWentz, Delonda Coley, MD        Patient Vitals for the past 24 hrs:  BP Temp Temp src Pulse Resp SpO2 Height Weight  10/30/18 1257 - 98.9 F (37.2 C) Axillary - - - - -  10/30/18 1247 - - - - - - 5\' 5"  (1.651 m) 52.2 kg  10/30/18 1246 120/90 - - 92 18 97 % - -     Medical Decision Making: hematuria, x1 found in diaper today.  Urinary bladder emptying with bladder scanning.  Unable to pass urinary catheter, urology consult  CRITICAL CARE-no Performed by: Mancel BaleElliott Harriet Bollen  Nursing Notes Reviewed/ Care Coordinated Applicable Imaging Reviewed Interpretation of Laboratory Data incorporated into ED treatment  Plan disposition by oncoming provider team following consultation with urology  Final Clinical Impressions(s) / ED Diagnoses   Final diagnoses:  Hematuria, unspecified type    ED Discharge Orders    None       Mancel BaleWentz, Florice Hindle, MD 10/30/18 1526

## 2018-10-30 NOTE — Discharge Instructions (Addendum)
You were evaluated in the Emergency Department and after careful evaluation, we did not find any emergent condition requiring admission or further testing in the hospital.  We discussed the bleeding with the urology experts, and they would like to see you in their office.  Please call the number provided today or first thing tomorrow morning to set up an appointment.  Please return to the Emergency Department if you experience any worsening of your condition.  We encourage you to follow up with a primary care provider.  Thank you for allowing [place] to be a part of your care.

## 2018-10-30 NOTE — ED Notes (Signed)
Dr. Eulis Foster attemped coude tip cath. No success.

## 2018-10-30 NOTE — ED Notes (Signed)
Attempted in and out cath.   Bladder scan showed less then 65ml urine.   EDP notified.

## 2018-10-30 NOTE — ED Provider Notes (Signed)
  Provider Note MRN:  [id number]  Arrival date & time: 10/30/18    ED Course and Medical Decision Making  Assumed care from Dr. Eulis Foster at shift change.  Question of hematuria in this 58 year old gentleman with history of cerebral palsy.  Patient has reassuring laboratory assessment, no AKI.  Is not retaining urine according to bladder scan at bedside.  Unable to catheterize, question of blockage or narrowing at the prostate.  Still, patient is able to urinate here, chronically incontinent.  Will discuss case with urology to ensure prompt follow-up, otherwise plan for discharge.  3:35 PM update: Discussed with [facility], patient is likely just "clamping down" when the catheter is attempting to be placed.  Reassuring the patient is not retaining urine.  Appropriate for discharge with urology follow-up in the clinic to further evaluate this hematuria.  Strict return precautions for fever, inability to urinate.  Final Clinical Impressions(s) / ED Diagnoses     ICD-10-CM   1. Hematuria, unspecified type  R31.9     ED Discharge Orders    None        Discharge Instructions     You were evaluated in the Emergency Department and after careful evaluation, we did not find any emergent condition requiring admission or further testing in the hospital.  We discussed the bleeding with the urology experts, and they would like to see you in their office.  Please call the number provided today or first thing tomorrow morning to set up an appointment.  Please return to the Emergency Department if you experience any worsening of your condition.  We encourage you to follow up with a primary care provider.  Thank you for allowing [place] to be a part of your care.      Barth Kirks. Sedonia Small, [place] [email or web address]    Maudie Flakes, MD 10/30/18 [phone]

## 2018-10-30 NOTE — ED Triage Notes (Signed)
Patient nonverbal per normal. Family member reports patient having blood with clots in urine when being changed this morning. Patient does use sign language for yes and no. Patient does report pain. Family member denies any other symptoms. Family denies knowledge of any prior hx of hematuria.

## 2018-11-17 ENCOUNTER — Ambulatory Visit (INDEPENDENT_AMBULATORY_CARE_PROVIDER_SITE_OTHER): Payer: Medicare Other | Admitting: Urology

## 2018-11-17 DIAGNOSIS — R310 Gross hematuria: Secondary | ICD-10-CM

## 2018-11-18 ENCOUNTER — Other Ambulatory Visit (HOSPITAL_COMMUNITY): Payer: Self-pay | Admitting: Urology

## 2018-11-18 DIAGNOSIS — R310 Gross hematuria: Secondary | ICD-10-CM

## 2018-12-02 ENCOUNTER — Other Ambulatory Visit: Payer: Self-pay

## 2018-12-02 ENCOUNTER — Ambulatory Visit (HOSPITAL_COMMUNITY)
Admission: RE | Admit: 2018-12-02 | Discharge: 2018-12-02 | Disposition: A | Discharge status: Home / Self Care | Payer: Medicare Other | Source: Ambulatory Visit | Attending: Urology | Admitting: Urology

## 2018-12-02 DIAGNOSIS — R310 Gross hematuria: Secondary | ICD-10-CM | POA: Insufficient documentation

## 2018-12-02 MED ORDER — SODIUM CHLORIDE 0.9 % IV SOLN
INTRAVENOUS | Status: AC
  Filled 2018-12-02: qty 250

## 2018-12-02 MED ORDER — IOHEXOL 300 MG/ML  SOLN
125.0000 mL | Freq: Once | INTRAMUSCULAR | Status: AC | PRN
  Administered 2018-12-02: 125 mL via INTRAVENOUS

## 2018-12-08 ENCOUNTER — Ambulatory Visit (INDEPENDENT_AMBULATORY_CARE_PROVIDER_SITE_OTHER): Payer: Medicare Other | Admitting: Urology

## 2018-12-08 DIAGNOSIS — R310 Gross hematuria: Secondary | ICD-10-CM

## 2018-12-09 ENCOUNTER — Other Ambulatory Visit: Payer: Self-pay | Admitting: Urology

## 2018-12-13 ENCOUNTER — Other Ambulatory Visit (HOSPITAL_COMMUNITY): Payer: Self-pay | Admitting: Urology

## 2018-12-13 DIAGNOSIS — N200 Calculus of kidney: Secondary | ICD-10-CM

## 2018-12-30 ENCOUNTER — Other Ambulatory Visit (HOSPITAL_COMMUNITY)
Admission: RE | Admit: 2018-12-30 | Discharge: 2018-12-30 | Disposition: A | Discharge status: Home / Self Care | Payer: Medicare Other | Source: Ambulatory Visit | Attending: Urology | Admitting: Urology

## 2018-12-30 ENCOUNTER — Encounter (HOSPITAL_COMMUNITY): Payer: Self-pay | Admitting: *Deleted

## 2018-12-30 ENCOUNTER — Other Ambulatory Visit: Payer: Self-pay

## 2018-12-30 DIAGNOSIS — Z01812 Encounter for preprocedural laboratory examination: Secondary | ICD-10-CM | POA: Insufficient documentation

## 2018-12-30 DIAGNOSIS — Z20828 Contact with and (suspected) exposure to other viral communicable diseases: Secondary | ICD-10-CM | POA: Insufficient documentation

## 2018-12-30 LAB — SARS CORONAVIRUS 2 (TAT 6-24 HRS): SARS Coronavirus 2: NEGATIVE

## 2018-12-31 ENCOUNTER — Ambulatory Visit (HOSPITAL_COMMUNITY): Payer: Medicare Other | Admitting: Physician Assistant

## 2018-12-31 ENCOUNTER — Telehealth: Payer: Self-pay | Admitting: *Deleted

## 2018-12-31 ENCOUNTER — Other Ambulatory Visit: Payer: Self-pay | Admitting: Physician Assistant

## 2018-12-31 NOTE — Anesthesia Preprocedure Evaluation (Deleted)
Anesthesia Evaluation  Patient identified by MRN, date of birth, ID band Patient awake    Reviewed: Allergy & Precautions, H&P , NPO status , Patient's Chart, lab work & pertinent test results  Airway Mallampati: II  TM Distance: >3 FB Neck ROM: Full    Dental no notable dental hx. (+) Edentulous Upper, Edentulous Lower, Dental Advisory Given   Pulmonary neg pulmonary ROS,    Pulmonary exam normal breath sounds clear to auscultation       Cardiovascular negative cardio ROS   Rhythm:Regular Rate:Normal     Neuro/Psych negative neurological ROS  negative psych ROS   GI/Hepatic negative GI ROS, Neg liver ROS,   Endo/Other  negative endocrine ROS  Renal/GU negative Renal ROS  negative genitourinary   Musculoskeletal   Abdominal   Peds  Hematology negative hematology ROS (+)   Anesthesia Other Findings   Reproductive/Obstetrics negative OB ROS                            Anesthesia Physical Anesthesia Plan  ASA: II  Anesthesia Plan: General   Post-op Pain Management:    Induction: Intravenous  PONV Risk Score and Plan: 3 and Ondansetron, Dexamethasone and Midazolam  Airway Management Planned: Oral ETT  Additional Equipment:   Intra-op Plan:   Post-operative Plan: Extubation in OR  Informed Consent: I have reviewed the patients History and Physical, chart, labs and discussed the procedure including the risks, benefits and alternatives for the proposed anesthesia with the patient or authorized representative who has indicated his/her understanding and acceptance.     Dental advisory given  Plan Discussed with: CRNA  Anesthesia Plan Comments: (H/o cerebral palsy with upper and lower extremity contractures, wheelchair bound. Non-verbal, communicates with hand signs.  Sister is caregiver.  Per sister patient can transfer himself.  Seen in ED 10/30/2018 for hematuria, reassuring labs,  vitals normal.  Followed up with urology.   Last seen by PCP 04/02/2016, stable at this time.  In the process of establishing care with new provider.  Per nurse interview sister is primary caregiver, but is not POA.  Sister states he does not have a POA.  She has historically signed consents for him.  He can communicate and understand explanation of procedures.  Sister signed most recent consent for catheterization in ED.  Discussed with Dr. Marcie Bal.  SDW, will evaluate DOS.  )      Anesthesia Quick Evaluation

## 2018-12-31 NOTE — Telephone Encounter (Signed)
Pt's sister called to get his covid-19 test results. She is advised that he is negative. He is having surgery on Monday. She voiced understanding.

## 2019-01-03 ENCOUNTER — Inpatient Hospital Stay (HOSPITAL_COMMUNITY)
Admission: AD | Admit: 2019-01-03 | Discharge: 2019-01-06 | DRG: 694 | Disposition: A | Discharge status: Home / Self Care | Payer: Medicare Other | Source: Other Acute Inpatient Hospital | Attending: Urology | Admitting: Urology

## 2019-01-03 ENCOUNTER — Encounter (HOSPITAL_COMMUNITY)
Admission: AD | Disposition: A | Discharge status: Home / Self Care | Payer: Self-pay | Source: Other Acute Inpatient Hospital | Attending: Urology

## 2019-01-03 ENCOUNTER — Other Ambulatory Visit: Payer: Self-pay

## 2019-01-03 ENCOUNTER — Ambulatory Visit (HOSPITAL_COMMUNITY)
Admission: RE | Admit: 2019-01-03 | Discharge: 2019-01-03 | Disposition: A | Discharge status: Home / Self Care | Payer: Medicare Other | Source: Ambulatory Visit | Attending: Urology | Admitting: Urology

## 2019-01-03 ENCOUNTER — Encounter (HOSPITAL_COMMUNITY): Payer: Self-pay

## 2019-01-03 DIAGNOSIS — Z87442 Personal history of urinary calculi: Secondary | ICD-10-CM | POA: Diagnosis not present

## 2019-01-03 DIAGNOSIS — N200 Calculus of kidney: Secondary | ICD-10-CM

## 2019-01-03 DIAGNOSIS — G839 Paralytic syndrome, unspecified: Secondary | ICD-10-CM | POA: Diagnosis present

## 2019-01-03 DIAGNOSIS — G809 Cerebral palsy, unspecified: Secondary | ICD-10-CM | POA: Diagnosis present

## 2019-01-03 DIAGNOSIS — Z23 Encounter for immunization: Secondary | ICD-10-CM | POA: Diagnosis present

## 2019-01-03 DIAGNOSIS — Z20828 Contact with and (suspected) exposure to other viral communicable diseases: Secondary | ICD-10-CM | POA: Diagnosis present

## 2019-01-03 DIAGNOSIS — N202 Calculus of kidney with calculus of ureter: Principal | ICD-10-CM | POA: Diagnosis present

## 2019-01-03 DIAGNOSIS — N111 Chronic obstructive pyelonephritis: Secondary | ICD-10-CM | POA: Diagnosis present

## 2019-01-03 HISTORY — PX: IR NEPHROSTOMY PLACEMENT RIGHT: IMG6064

## 2019-01-03 HISTORY — DX: Paralytic syndrome, unspecified: G83.9

## 2019-01-03 LAB — CBC
HCT: 39.8 % (ref 39.0–52.0)
Hemoglobin: 12.2 g/dL — ABNORMAL LOW (ref 13.0–17.0)
MCH: 27.9 pg (ref 26.0–34.0)
MCHC: 30.7 g/dL (ref 30.0–36.0)
MCV: 90.9 fL (ref 80.0–100.0)
Platelets: 311 10*3/uL (ref 150–400)
RBC: 4.38 MIL/uL (ref 4.22–5.81)
RDW: 14.2 % (ref 11.5–15.5)
WBC: 9.7 10*3/uL (ref 4.0–10.5)
nRBC: 0 % (ref 0.0–0.2)

## 2019-01-03 LAB — BASIC METABOLIC PANEL
Anion gap: 8 (ref 5–15)
BUN: 29 mg/dL — ABNORMAL HIGH (ref 6–20)
CO2: 24 mmol/L (ref 22–32)
Calcium: 8.9 mg/dL (ref 8.9–10.3)
Chloride: 105 mmol/L (ref 98–111)
Creatinine, Ser: 0.91 mg/dL (ref 0.61–1.24)
GFR calc Af Amer: >60 mL/min (ref >60)
GFR calc non Af Amer: >60 mL/min (ref >60)
Glucose, Bld: 100 mg/dL — ABNORMAL HIGH (ref 70–99)
Potassium: 4.5 mmol/L (ref 3.5–5.1)
Sodium: 137 mmol/L (ref 135–145)

## 2019-01-03 LAB — PROTIME-INR
INR: 1 (ref 0.8–1.2)
Prothrombin Time: 12.9 seconds (ref 11.4–15.2)

## 2019-01-03 SURGERY — NEPHROLITHOTOMY PERCUTANEOUS
Anesthesia: General | Laterality: Right

## 2019-01-03 MED ORDER — FENTANYL CITRATE (PF) 100 MCG/2ML IJ SOLN
INTRAMUSCULAR | Status: AC | PRN
  Administered 2019-01-03: 50 ug via INTRAVENOUS

## 2019-01-03 MED ORDER — ZOLPIDEM TARTRATE 5 MG PO TABS: 5.0000 mg | ORAL_TABLET | Freq: Every evening | ORAL | Status: DC | PRN

## 2019-01-03 MED ORDER — OXYCODONE-ACETAMINOPHEN 5-325 MG PO TABS
1.0000 | ORAL_TABLET | ORAL | Status: DC | PRN
  Administered 2019-01-03 – 2019-01-05 (×5): 1 via ORAL
  Filled 2019-01-03 (×5): qty 1

## 2019-01-03 MED ORDER — SODIUM CHLORIDE 0.9 % IV SOLN
2.0000 g | INTRAVENOUS | Status: AC
  Administered 2019-01-03: 2 g via INTRAVENOUS
  Filled 2019-01-03: qty 20

## 2019-01-03 MED ORDER — DIPHENHYDRAMINE HCL 12.5 MG/5ML PO ELIX: 12.5000 mg | ORAL_SOLUTION | Freq: Four times a day (QID) | ORAL | Status: DC | PRN

## 2019-01-03 MED ORDER — FENTANYL CITRATE (PF) 250 MCG/5ML IJ SOLN
INTRAMUSCULAR | Status: AC
  Filled 2019-01-03: qty 5

## 2019-01-03 MED ORDER — SODIUM CHLORIDE 0.9% FLUSH
5.0000 mL | Freq: Three times a day (TID) | INTRAVENOUS | Status: DC
  Administered 2019-01-03 – 2019-01-06 (×10): 5 mL

## 2019-01-03 MED ORDER — LACTATED RINGERS IV SOLN
INTRAVENOUS | Status: DC
  Administered 2019-01-03: 09:00:00 via INTRAVENOUS

## 2019-01-03 MED ORDER — SODIUM CHLORIDE 0.9 % IV SOLN
2.0000 g | INTRAVENOUS | Status: DC
  Administered 2019-01-04 – 2019-01-06 (×3): 2 g via INTRAVENOUS
  Filled 2019-01-03 (×3): qty 2

## 2019-01-03 MED ORDER — INFLUENZA VAC SPLIT QUAD 0.5 ML IM SUSY
0.5000 mL | PREFILLED_SYRINGE | INTRAMUSCULAR | Status: AC
  Administered 2019-01-04: 0.5 mL via INTRAMUSCULAR
  Filled 2019-01-03 (×2): qty 0.5

## 2019-01-03 MED ORDER — ACETAMINOPHEN 325 MG PO TABS: 650.0000 mg | ORAL_TABLET | ORAL | Status: DC | PRN

## 2019-01-03 MED ORDER — LIDOCAINE HCL (PF) 1 % IJ SOLN
INTRAMUSCULAR | Status: AC | PRN
  Administered 2019-01-03 (×2): 10 mL

## 2019-01-03 MED ORDER — FENTANYL CITRATE (PF) 100 MCG/2ML IJ SOLN
INTRAMUSCULAR | Status: AC
  Filled 2019-01-03: qty 4

## 2019-01-03 MED ORDER — CIPROFLOXACIN IN D5W 400 MG/200ML IV SOLN: 400.0000 mg | Freq: Once | INTRAVENOUS | Status: DC

## 2019-01-03 MED ORDER — ACETAMINOPHEN 500 MG PO TABS: 1000.0000 mg | ORAL_TABLET | Freq: Once | ORAL | Status: DC

## 2019-01-03 MED ORDER — PROPOFOL 10 MG/ML IV BOLUS
INTRAVENOUS | Status: AC
  Filled 2019-01-03: qty 20

## 2019-01-03 MED ORDER — HYDROMORPHONE HCL 1 MG/ML IJ SOLN: 0.5000 mg | INTRAMUSCULAR | Status: DC | PRN

## 2019-01-03 MED ORDER — MIDAZOLAM HCL 2 MG/2ML IJ SOLN
INTRAMUSCULAR | Status: AC | PRN
  Administered 2019-01-03: 0.5 mg via INTRAVENOUS
  Administered 2019-01-03: .25 mg via INTRAVENOUS
  Administered 2019-01-03: 1 mg via INTRAVENOUS

## 2019-01-03 MED ORDER — DIPHENHYDRAMINE HCL 50 MG/ML IJ SOLN: 12.5000 mg | Freq: Four times a day (QID) | INTRAMUSCULAR | Status: DC | PRN

## 2019-01-03 MED ORDER — LIDOCAINE HCL 1 % IJ SOLN
INTRAMUSCULAR | Status: AC
  Filled 2019-01-03: qty 20

## 2019-01-03 MED ORDER — SODIUM CHLORIDE 0.9 % IV SOLN: INTRAVENOUS | Status: DC

## 2019-01-03 MED ORDER — LACTATED RINGERS IV SOLN
INTRAVENOUS | Status: AC | PRN
  Administered 2019-01-03: 1000 mL via INTRAVENOUS

## 2019-01-03 MED ORDER — IOHEXOL 300 MG/ML  SOLN
50.0000 mL | Freq: Once | INTRAMUSCULAR | Status: AC | PRN
  Administered 2019-01-03: 15 mL

## 2019-01-03 MED ORDER — ONDANSETRON HCL 4 MG/2ML IJ SOLN
4.0000 mg | INTRAMUSCULAR | Status: DC | PRN
  Administered 2019-01-03 – 2019-01-04 (×2): 4 mg via INTRAVENOUS
  Filled 2019-01-03 (×2): qty 2

## 2019-01-03 MED ORDER — SODIUM CHLORIDE 0.9 % IV SOLN
INTRAVENOUS | Status: DC
  Administered 2019-01-03 – 2019-01-05 (×5): via INTRAVENOUS

## 2019-01-03 MED ORDER — MIDAZOLAM HCL 2 MG/2ML IJ SOLN
INTRAMUSCULAR | Status: AC
  Filled 2019-01-03: qty 4

## 2019-01-03 NOTE — H&P (Signed)
Urology Admission H&P  Chief Complaint: left flank pain  History of Present Illness: [name] is a 58yo with a left staghorn calculus who was scheduled to undergo PCNL today. A left nephrostomy tube was placed by IR and purulent drainage was noted from the nephrostomy tube. No fevers/chills/sweats. No nausea/vomiting. He has intermittent left flank pain that is worse after nephrostomy tube placement.   Past Medical History:  Diagnosis Date  . Cerebral palsy ([place])   . 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 TENDON RELEASE      Home Medications:  Current Facility-Administered Medications  Medication Dose Route Frequency Provider Last Rate Last Dose  . 0.9 %  sodium chloride infusion   Intravenous Continuous Candiss Norse A, PA-C      . acetaminophen (TYLENOL) tablet 1,000 mg  1,000 mg Oral Once Roderic Palau, MD      . lactated ringers infusion   Intravenous Continuous Alyson Ingles Candee Furbish, MD 10 mL/hr at 01/03/19 0845     Facility-Administered Medications Ordered in Other Encounters  Medication Dose Route Frequency Provider Last Rate Last Dose  . lidocaine (XYLOCAINE) 1 % (with pres) injection            Allergies: No Known Allergies  Family History  Problem Relation Age of Onset  . Diabetes Mother   . Heart failure Mother   . Hypertension Mother   . Diabetes Father   . Heart failure Father   . Hypertension Father   . Cancer Father    Social History:  reports that he has never smoked. He has never used smokeless tobacco. He reports that he does not drink alcohol or use drugs.  Review of Systems  Genitourinary: Positive for flank pain.  All other systems reviewed and are negative.   Physical Exam:  Vital signs in last 24 hours: Temp:  [98 F (36.7 C)-98.3 F (36.8 C)] 98.3 F (36.8 C) (11/09 1150) Pulse Rate:  [65-94] 85 (11/09 1150) Resp:  [9-18] 18 (11/09 1150) BP:  (54-134)/(35-102) 134/101 (11/09 1150) SpO2:  [93 %-100 %] 94 % (11/09 1150) Weight:  [54.4 kg] 54.4 kg (11/09 0818) Physical Exam  Constitutional: He is oriented to person, place, and time. He appears well-developed and well-nourished.  HENT:  Head: Normocephalic and atraumatic.  Eyes: Pupils are equal, round, and reactive to light. EOM are normal.  Neck: Normal range of motion. No thyromegaly present.  Cardiovascular: Normal rate and regular rhythm.  Respiratory: Effort normal. No respiratory distress.  GI: Soft. He exhibits no distension.  Musculoskeletal: Normal range of motion.        General: No edema.  Neurological: He is alert and oriented to person, place, and time.  Skin: Skin is warm and dry.  Psychiatric: He has a normal mood and affect. His behavior is normal. Judgment and thought content normal.    Laboratory Data:  Results for orders placed or performed during the hospital encounter of 01/03/19 (from the past 24 hour(s))  CBC     Status: Abnormal   Collection Time: 01/03/19  8:11 AM  Result Value Ref Range   WBC 9.7 4.0 - 10.5 K/uL   RBC 4.38 4.22 - 5.81 MIL/uL   Hemoglobin 12.2 (L) 13.0 - 17.0 g/dL   HCT 39.8 39.0 - 52.0 %   MCV 90.9 80.0 - 100.0 fL   MCH 27.9 26.0 - 34.0 pg   MCHC 30.7  30.0 - 36.0 g/dL   RDW 63.1 49.7 - 02.6 %   Platelets 311 150 - 400 K/uL   nRBC 0.0 0.0 - 0.2 %  Basic metabolic panel     Status: Abnormal   Collection Time: 01/03/19  8:11 AM  Result Value Ref Range   Sodium 137 135 - 145 mmol/L   Potassium 4.5 3.5 - 5.1 mmol/L   Chloride 105 98 - 111 mmol/L   CO2 24 22 - 32 mmol/L   Glucose, Bld 100 (H) 70 - 99 mg/dL   BUN 29 (H) 6 - 20 mg/dL   Creatinine, Ser 3.78 0.61 - 1.24 mg/dL   Calcium 8.9 8.9 - 58.8 mg/dL   GFR calc non Af Amer >60 >60 mL/min   GFR calc Af Amer >60 >60 mL/min   Anion gap 8 5 - 15  Protime-INR upon arrival     Status: None   Collection Time: 01/03/19  8:30 AM  Result Value Ref Range   Prothrombin Time 12.9  11.4 - 15.2 seconds   INR 1.0 0.8 - 1.2   Recent Results (from the past 240 hour(s))  SARS CORONAVIRUS 2 (TAT 6-24 HRS) Nasopharyngeal Nasopharyngeal Swab     Status: None   Collection Time: 12/30/18  7:21 AM   Specimen: Nasopharyngeal Swab  Result Value Ref Range Status   SARS Coronavirus 2 NEGATIVE NEGATIVE Final    Comment: (NOTE) SARS-CoV-2 target nucleic acids are NOT DETECTED. The SARS-CoV-2 RNA is generally detectable in upper and lower respiratory specimens during the acute phase of infection. Negative results do not preclude SARS-CoV-2 infection, do not rule out co-infections with other pathogens, and should not be used as the sole basis for treatment or other patient management decisions. Negative results must be combined with clinical observations, patient history, and epidemiological information. The expected result is Negative. Fact Sheet for Patients: HairSlick.no Fact Sheet for Healthcare Providers: quierodirigir.com This test is not yet approved or cleared by the [place] FDA and  has been authorized for detection and/or diagnosis of SARS-CoV-2 by FDA under an Emergency Use Authorization (EUA). This EUA will remain  in effect (meaning this test can be used) for the duration of the COVID-19 declaration under Section 56 4(b)(1) of the Act, 21 U.S.C. section 360bbb-3(b)(1), unless the authorization is terminated or revoked sooner. Performed at [facility], [address], [place], [place] [address]    Creatinine: Recent Labs    01/03/19 4128  CREATININE 0.91   Baseline Creatinine: 0.9  Impression/Assessment:  58yo with pyelonephrosis, left renal calculi  Plan:  1. The patient will be admitted for IV antibiotics and CV monitoring followup left nephrostomy tube placement.   [name] 01/03/2019, 11:58 AM

## 2019-01-03 NOTE — Progress Notes (Signed)
Pt taken to [facility], room 1420, sister at the bedside.  Report given to [employer], Therapist, sports.  Assisted nurse to get pt over to the inpatient bed, helped clean the pt up, depends removed and new clean chuck pads placed under patient.  Paperwork given to [employer], Therapist, sports.

## 2019-01-03 NOTE — Procedures (Signed)
Interventional Radiology Procedure Note  Procedure: Right percutaneous nephrostomy tube placement  Complications: None  Estimated Blood Loss: < 10 mL  Findings: After puncture of the right renal collecting system, there is return of purulent fluid and blood.  Sample sent for culture. Some outflow of contrast into right ureter. 8.5 Fr PCN placed and attached to gravity bag. Discussed with Dr. Alyson Ingles.  Venetia Night. Kathlene Cote, M.D Pager:  [phone]

## 2019-01-03 NOTE — Consult Note (Signed)
Chief Complaint: Patient was seen in consultation today for right percutaneous nephrostomy/nephroureteral catheter placement   Referring Physician(s): McKenzie,P  Supervising Physician: Irish LackYamagata, Glenn  Patient Status: [facility] - [facility]  History of Present Illness: [name] is a 58 y.o. male with history of cerebral palsy, back pain, prior hematuria and recent CT abdomen pelvis on 12/02/2018 which revealed:  1. 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 There is also a right kidney lower pole nonobstructive 1.1 by 1.1 cm calculus. 2. Abnormal hypoenhancement of the right kidney with lack of excretion of contrast into the collecting system, and thinning of the cortex of the right kidney with dilated calices and poor definition of the renal medulla. 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 3. Mild urinary bladder wall thickening may reflect cystitis. Suspected tiny calculi dependently in the urinary bladder. 4. Dilated fluid-filled distal esophagus, query dysmotility or reactive airways disease. 5. Constipation with a highly distended rectum full of stool. 6. Congenitally short pedicles in the lumbar spine. 7. Airway thickening is present, suggesting bronchitis or reactive airways disease. 8. The duodenum crosses the midline but does not extend back up to the level of the duodenal bulb, suggesting a lax ligament of Treitz but not overt malrotation. 9. Mildly enlarged retrocaval lymph node, probably reactive  He presents today for right percutaneous nephrostomy/nephroureteral catheter placement prior to nephrolithotomy.  He is COVID-19 negative.   Past Medical History:  Diagnosis Date  . Cerebral palsy (HCC)   . 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 TENDON RELEASE      Allergies: Patient has no known allergies.  Medications: Prior to Admission medications   Medication Sig Start Date End Date Taking? Authorizing Provider  acetaminophen (TYLENOL) 325 MG tablet Take 325 mg by mouth every 6 (six) hours as needed for moderate pain or headache.   Yes [provider]  liver oil-zinc oxide (DESITIN) 40 % ointment Apply 1 application topically as needed for irritation.   Yes [provider]     Family History  Problem Relation Age of Onset  . Diabetes Mother   . Heart failure Mother   . Hypertension Mother   . Diabetes Father   . Heart failure Father   . Hypertension Father   . Cancer Father     Social History   Socioeconomic History  . Marital status: Single    Spouse name: Not on file  . Number of children: Not on file  . Years of education: Not on file  . Highest education level: Not on file  Occupational History  . Not on file  Social Needs  . Financial resource strain: Not on file  . Food insecurity    Worry: Not on file    Inability: Not on file  . Transportation needs    Medical: Not on file    Non-medical: Not on file  Tobacco Use  . Smoking status: Never Smoker  . Smokeless tobacco: Never Used  Substance and Sexual Activity  . Alcohol use: No  . Drug use: No  . Sexual activity: Not on file  Lifestyle  . Physical activity    Days per week: Not on file    Minutes per session: Not on file  . Stress: Not  on file  Relationships  . Social Herbalist on phone: Not on file    Gets together: Not on file    Attends religious service: Not on file    Active member of club or organization: Not on file    Attends meetings of clubs or organizations: Not on file    Relationship status: Not on file  Other Topics Concern  . Not on file  Social History Narrative  . Not on file      Review of Systems see above; currently denies fever, headache, chest pain, dyspnea,  cough, abdominal pain, nausea, vomiting or bleeding.  Vital Signs: BP 132/89 (BP Location: Right Arm)   Pulse 94   Temp 98 F (36.7 C) (Oral)   Resp 18   Wt 119 lb 14.4 oz (54.4 kg)   SpO2 96%   BMI 19.95 kg/m   Physical Exam awake, cognizant, patient nonverbal.  Sister present at bedside; chest clear to auscultation bilaterally.  Heart with regular rate and rhythm.  Abdomen soft, positive bowel sounds, nontender.  No lower extremity edema.  Upper and lower extremity contractures  Imaging: No results found.  Labs:  CBC: Recent Labs    10/30/18 1437  WBC 7.2  HGB 11.8*  HCT 39.7  PLT 374    COAGS: No results for input(s): INR, APTT in the last 8760 hours.  BMP: Recent Labs    10/30/18 1437  NA 144  K 4.0  CL 111  CO2 23  GLUCOSE 102*  BUN 38*  CALCIUM 9.1  CREATININE 0.99  GFRNONAA >60  GFRAA >60    LIVER FUNCTION TESTS: No results for input(s): BILITOT, AST, ALT, ALKPHOS, PROT, ALBUMIN in the last 8760 hours.  TUMOR MARKERS: No results for input(s): AFPTM, CEA, CA199, CHROMGRNA in the last 8760 hours.  Assessment and Plan: 58 y.o. male with history of cerebral palsy, back pain, prior hematuria and recent CT abdomen pelvis on 12/02/2018 which revealed:  1. 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 There is also a right kidney lower pole nonobstructive 1.1 by 1.1 cm calculus. 2. Abnormal hypoenhancement of the right kidney with lack of excretion of contrast into the collecting system, and thinning of the cortex of the right kidney with dilated calices and poor definition of the renal medulla. 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 3. Mild urinary bladder wall thickening may reflect cystitis. Suspected tiny calculi dependently in the urinary bladder. 4. Dilated fluid-filled distal esophagus, query dysmotility  or reactive airways disease. 5. Constipation with a highly distended rectum full of stool. 6. Congenitally short pedicles in the lumbar spine. 7. Airway thickening is present, suggesting bronchitis or reactive airways disease. 8. The duodenum crosses the midline but does not extend back up to the level of the duodenal bulb, suggesting a lax ligament of Treitz but not overt malrotation. 9. Mildly enlarged retrocaval lymph node, probably reactive  He presents today for right percutaneous nephrostomy/nephroureteral catheter placement prior to nephrolithotomy.  He is COVID-19 negative.Risks and benefits of right PCN placement was discussed with the patient/sister including, but not limited to, infection, bleeding, significant bleeding causing loss or decrease in renal function or damage to adjacent structures.   All of the patient's questions were answered, patient is agreeable to proceed.  Consent signed and in chart.  LABS PENDING    Thank you  for this interesting consult.  I greatly enjoyed meeting [name] and look forward to participating in their care.  A copy of this report was sent to the requesting provider on this date.  Electronically Signed: D. Jeananne Rama, PA-C 01/03/2019, 8:35 AM   I spent a total of 25 minutes  in face to face in clinical consultation, greater than 50% of which was counseling/coordinating care for right percutaneous nephrostomy/nephroureteral catheter placement

## 2019-01-04 ENCOUNTER — Other Ambulatory Visit: Payer: Self-pay | Admitting: Urology

## 2019-01-04 LAB — HIV ANTIBODY (ROUTINE TESTING W REFLEX): HIV Screen 4th Generation wRfx: NONREACTIVE

## 2019-01-04 NOTE — Progress Notes (Signed)
1 Day Post-Op Subjective: Patient had low grade fever overnight. No pain. Urine culture pending  Objective: Vital signs in last 24 hours: Temp:  [98.1 F (36.7 C)-99.7 F (37.6 C)] 98.1 F (36.7 C) (11/10 1446) Pulse Rate:  [74-91] 74 (11/10 1446) Resp:  [16-18] 18 (11/10 1446) BP: (119-131)/(89-95) 119/89 (11/10 1446) SpO2:  [93 %-96 %] 93 % (11/10 1446)  Intake/Output from previous day: 11/09 0701 - 11/10 0700 In: 1142.9 [P.O.:480; I.V.:662.9] Out: 600 [Urine:600] Intake/Output this shift: Total I/O In: 240 [P.O.:240] Out: -   Physical Exam:  General:alert and appears stated age GI: soft, non tender, normal bowel sounds, no palpable masses, no organomegaly, no inguinal hernia Male genitalia: not done Extremities: extremities normal, atraumatic, no cyanosis or edema  Lab Results: Recent Labs    01/03/19 0811  HGB 12.2*  HCT 39.8   BMET Recent Labs    01/03/19 0811  NA 137  K 4.5  CL 105  CO2 24  GLUCOSE 100*  BUN 29*  CREATININE 0.91  CALCIUM 8.9   Recent Labs    01/03/19 0830  INR 1.0   No results for input(s): LABURIN in the last 72 hours. Results for orders placed or performed during the hospital encounter of 01/03/19  Urine culture     Status: Abnormal (Preliminary result)   Collection Time: 01/03/19 11:31 AM   Specimen: Kidney; Urine  Result Value Ref Range Status   Specimen Description   Final    KIDNEY RIGHT Performed at [facility] [address], [place], [place] [address]    Special Requests   Final    NONE Performed at [facility], [place] [address], [place], [place] [address]    Culture >=100,000 COLONIES/mL GRAM NEGATIVE RODS (A)  Final   Report Status PENDING  Incomplete    Studies/Results: Ir Nephrostomy Placement Right  Result Date: 01/03/2019 CLINICAL DATA:  Obstructing calculus of right renal pelvis and UPJ with planned percutaneous nephrolithotomy procedure after renal and ureteral  access. EXAM: 1. ULTRASOUND GUIDANCE FOR PUNCTURE OF THE RIGHT RENAL COLLECTING SYSTEM. 2. RIGHT PERCUTANEOUS NEPHROSTOMY TUBE PLACEMENT. COMPARISON:  CT of the abdomen and pelvis on 12/02/2018 ANESTHESIA/SEDATION: 1.75 mg IV Versed; 50 mcg IV Fentanyl. Total Moderate Sedation Time 44 minutes. The patient's level of consciousness and physiologic status were continuously monitored during the procedure by Radiology nursing. CONTRAST:  15 ml Omnipaque 300 MEDICATIONS: 2 g IV Rocephin. Antibiotic was administered in an appropriate time frame prior to skin puncture. FLUOROSCOPY TIME:  7 minutes and 24 seconds. 93.2 mGy. PROCEDURE: The procedure, risks, benefits, and alternatives were explained to the patient. Questions regarding the procedure were encouraged and answered. The patient understands and consents to the procedure. A time-out was performed prior to initiating the procedure. The right flank region was prepped with chlorhexidine in a sterile fashion, and a sterile drape was applied covering the operative field. A sterile gown and sterile gloves were used for the procedure. Local anesthesia was provided with 1% Lidocaine. Ultrasound was used to localize the right kidney. Under direct ultrasound guidance, a 21 gauge needle was advanced into the renal collecting system. Ultrasound image documentation was performed. Aspiration of urine sample was performed followed by contrast injection. A second 21 gauge needle was utilized in puncturing a lower pole calyx under fluoroscopy. After aspiration of urine, contrast was injected. A transitional dilator was advanced over a guidewire. Percutaneous tract dilatation was then performed over the guidewire. A 8.5-French percutaneous nephrostomy tube was  then advanced and formed in the collecting system. Catheter position was confirmed by fluoroscopy after contrast injection. The catheter was secured at the skin with a Prolene retention suture and Stat-Lock device. A gravity bag  was placed. COMPLICATIONS: None. FINDINGS: Under ultrasound guidance, central puncture of the collecting system was performed for initial opacification. There was immediate return of whitish purulent appearing fluid and contrast injection demonstrated a very distorted appearing collecting system. There was some flow of contrast into the ureter around the known central calculi. The pelvic and UPJ calculus is not radiopaque by x-ray. Additional lower pole access was performed more peripherally in the kidney allowing placement of a nephrostomy tube which was partially formed in the renal pelvis and extends into a lower pole infundibulum. A sample of purulent and bloody fluid was aspirated and sent for culture analysis. The nephrostomy tube was connected to gravity bag drainage. IMPRESSION: Puncture of the right renal collecting system revealed purulent urine return. Based on this finding, a nephrostomy tube was placed and a sample sent for culture analysis. Findings were discussed with Dr. Ronne Wise. Electronically Signed   By: [name] M.D.   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 Continue broad spectrum antibiotics pending urine culture   LOS: 1 day   [name] 01/04/2019, 3:33 PM

## 2019-01-04 NOTE — Progress Notes (Signed)
Referring Physician(s): * No referring provider recorded for this case *  Supervising Physician: [name]  Patient Status:  [facility] - In-pt  Chief Complaint:  Purulent output with right sided nephrostomy tube placement   Subjective:  58 y.o. male history of staghorn calculi. Presented to [name] for right sided nephrostomy tube placement for procedural use for scheduled PCNL on 11.9.20. Per sister at bedside. Patient denies any fevers, headache, chest pain, SOB, cough, vomiting or bleeding. Patient endorses  abdominal pain and nausea,   Allergies: Patient has no known allergies.  Medications: Prior to Admission medications   Medication Sig Start Date End Date Taking? Authorizing Provider  acetaminophen (TYLENOL) 325 MG tablet Take 325 mg by mouth every 6 (six) hours as needed for moderate pain or headache.   Yes [provider]  liver oil-zinc oxide (DESITIN) 40 % ointment Apply 1 application topically as needed for irritation.   Yes [provider]     Vital Signs: BP 101/71 (BP Location: Left Arm)   Pulse 85   Temp 98.2 F (36.8 C) (Oral)   Resp 18   Ht 5\' 6"  (1.676 m)   Wt 125 lb 10.6 oz (57 kg)   SpO2 97%   BMI 20.28 kg/m   Physical Exam Vital signs reviewed. Patient is awake, nonverbal. Nephrotomy tube unremarkable. Stat lock and suture intact, no erythema noted. Dressing is clean and dry. Purulent output can be noted in the line with serosanguinous output in the gravity bag. .    Imaging: Ir Nephrostomy Placement Right  Result Date: 01/03/2019 CLINICAL DATA:  Obstructing calculus of right renal pelvis and UPJ with planned percutaneous nephrolithotomy procedure after renal and ureteral access. EXAM: 1. ULTRASOUND GUIDANCE FOR PUNCTURE OF THE RIGHT RENAL COLLECTING SYSTEM. 2. RIGHT PERCUTANEOUS NEPHROSTOMY TUBE PLACEMENT. COMPARISON:  CT of the abdomen and pelvis on 12/02/2018 ANESTHESIA/SEDATION: 1.75 mg IV Versed; 50 mcg IV Fentanyl. Total  Moderate Sedation Time 44 minutes. The patient's level of consciousness and physiologic status were continuously monitored during the procedure by Radiology nursing. CONTRAST:  15 ml Omnipaque 300 MEDICATIONS: 2 g IV Rocephin. Antibiotic was administered in an appropriate time frame prior to skin puncture. FLUOROSCOPY TIME:  7 minutes and 24 seconds. 93.2 mGy. PROCEDURE: The procedure, risks, benefits, and alternatives were explained to the patient. Questions regarding the procedure were encouraged and answered. The patient understands and consents to the procedure. A time-out was performed prior to initiating the procedure. The right flank region was prepped with chlorhexidine in a sterile fashion, and a sterile drape was applied covering the operative field. A sterile gown and sterile gloves were used for the procedure. Local anesthesia was provided with 1% Lidocaine. Ultrasound was used to localize the right kidney. Under direct ultrasound guidance, a 21 gauge needle was advanced into the renal collecting system. Ultrasound image documentation was performed. Aspiration of urine sample was performed followed by contrast injection. A second 21 gauge needle was utilized in puncturing a lower pole calyx under fluoroscopy. After aspiration of urine, contrast was injected. A transitional dilator was advanced over a guidewire. Percutaneous tract dilatation was then performed over the guidewire. A 8.5-French percutaneous nephrostomy tube was then advanced and formed in the collecting system. Catheter position was confirmed by fluoroscopy after contrast injection. The catheter was secured at the skin with a Prolene retention suture and Stat-Lock device. A gravity bag was placed. COMPLICATIONS: None. FINDINGS: Under ultrasound guidance, central puncture of the collecting system was performed for initial  opacification. There was immediate return of whitish purulent appearing fluid and contrast injection demonstrated a very  distorted appearing collecting system. There was some flow of contrast into the ureter around the known central calculi. The pelvic and UPJ calculus is not radiopaque by x-ray. Additional lower pole access was performed more peripherally in the kidney allowing placement of a nephrostomy tube which was partially formed in the renal pelvis and extends into a lower pole infundibulum. A sample of purulent and bloody fluid was aspirated and sent for culture analysis. The nephrostomy tube was connected to gravity bag drainage. IMPRESSION: Puncture of the right renal collecting system revealed purulent urine return. Based on this finding, a nephrostomy tube was placed and a sample sent for culture analysis. Findings were discussed with [name]. Electronically Signed   By: [name] M.D.   On: 01/03/2019 12:06    Labs:  CBC: Recent Labs    10/30/18 1437 01/03/19 0811  WBC 7.2 9.7  HGB 11.8* 12.2*  HCT 39.7 39.8  PLT 374 311    COAGS: Recent Labs    01/03/19 0830  INR 1.0    BMP: Recent Labs    10/30/18 1437 01/03/19 0811  NA 144 137  K 4.0 4.5  CL 111 105  CO2 23 24  GLUCOSE 102* 100*  BUN 38* 29*  CALCIUM 9.1 8.9  CREATININE 0.99 0.91  GFRNONAA >60 >60  GFRAA >60 >60    LIVER FUNCTION TESTS: No results for input(s): BILITOT, AST, ALT, ALKPHOS, PROT, ALBUMIN in the last 8760 hours.  Assessment and Plan:  58 y.o male histoy of cerebral palsy found to have staghorn calculi while being worked up for back pain and hematuria. Patient presented to [facility] on hospital for placement of right nephrostomy tube for scheduled PCNL surgery. Purulent output was noted with placement of nephrostomy tube and patient was admitted for further evaluation. No WBC, no fevers urine culture shows gram negative rods. BUN 29 Recommend team flush prn minimal amounts and slowly. Continue to record output q shift and dressing changes as needed.  Electronically Signed: Avel Peace, NP 01/04/2019, 11:57 AM   I spent a total of 15 Minutes at the the patient's bedside AND on the patient's hospital floor or unit, greater than 50% of which was counseling/coordinating care for right sided nephrostomy tube placement    Patient ID: [name], male   DOB: [birth year], 58 y.o.   MRN: [id number]

## 2019-01-05 NOTE — Progress Notes (Signed)
Referring Physician(s): [place]  Supervising Physician: Aletta Edouard  Patient Status:  [facility] - In-pt  Chief Complaint:  Right renal stone/pyelonephritis  Subjective: Pt doing ok this am; sister in room   Allergies: Patient has no known allergies.  Medications: Prior to Admission medications   Medication Sig Start Date End Date Taking? Authorizing Provider  acetaminophen (TYLENOL) 325 MG tablet Take 325 mg by mouth every 6 (six) hours as needed for moderate pain or headache.   Yes [provider]  liver oil-zinc oxide (DESITIN) 40 % ointment Apply 1 application topically as needed for irritation.   Yes [provider]     Vital Signs: BP 132/90 (BP Location: Left Arm)   Pulse 85   Temp 98.3 F (36.8 C) (Oral)   Resp 16   Ht 5\' 6"  (1.676 m)   Wt 125 lb 10.6 oz (57 kg)   SpO2 96%   BMI 20.28 kg/m   Physical Exam awake, no acute changes; rt PCN intact, output 35 cc turbid light red fluid with debris, drain flushed without resistance  Imaging: Ir Nephrostomy Placement Right  Result Date: 01/03/2019 CLINICAL DATA:  Obstructing calculus of right renal pelvis and UPJ with planned percutaneous nephrolithotomy procedure after renal and ureteral access. EXAM: 1. ULTRASOUND GUIDANCE FOR PUNCTURE OF THE RIGHT RENAL COLLECTING SYSTEM. 2. RIGHT PERCUTANEOUS NEPHROSTOMY TUBE PLACEMENT. COMPARISON:  CT of the abdomen and pelvis on 12/02/2018 ANESTHESIA/SEDATION: 1.75 mg IV Versed; 50 mcg IV Fentanyl. Total Moderate Sedation Time 44 minutes. The patient's level of consciousness and physiologic status were continuously monitored during the procedure by Radiology nursing. CONTRAST:  15 ml Omnipaque 300 MEDICATIONS: 2 g IV Rocephin. Antibiotic was administered in an appropriate time frame prior to skin puncture. FLUOROSCOPY TIME:  7 minutes and 24 seconds. 93.2 mGy. PROCEDURE: The procedure, risks, benefits, and alternatives were explained to the patient. Questions  regarding the procedure were encouraged and answered. The patient understands and consents to the procedure. A time-out was performed prior to initiating the procedure. The right flank region was prepped with chlorhexidine in a sterile fashion, and a sterile drape was applied covering the operative field. A sterile gown and sterile gloves were used for the procedure. Local anesthesia was provided with 1% Lidocaine. Ultrasound was used to localize the right kidney. Under direct ultrasound guidance, a 21 gauge needle was advanced into the renal collecting system. Ultrasound image documentation was performed. Aspiration of urine sample was performed followed by contrast injection. A second 21 gauge needle was utilized in puncturing a lower pole calyx under fluoroscopy. After aspiration of urine, contrast was injected. A transitional dilator was advanced over a guidewire. Percutaneous tract dilatation was then performed over the guidewire. A 8.5-French percutaneous nephrostomy tube was then advanced and formed in the collecting system. Catheter position was confirmed by fluoroscopy after contrast injection. The catheter was secured at the skin with a Prolene retention suture and Stat-Lock device. A gravity bag was placed. COMPLICATIONS: None. FINDINGS: Under ultrasound guidance, central puncture of the collecting system was performed for initial opacification. There was immediate return of whitish purulent appearing fluid and contrast injection demonstrated a very distorted appearing collecting system. There was some flow of contrast into the ureter around the known central calculi. The pelvic and UPJ calculus is not radiopaque by x-ray. Additional lower pole access was performed more peripherally in the kidney allowing placement of a nephrostomy tube which was partially formed in the renal pelvis and extends into a lower pole infundibulum.  A sample of purulent and bloody fluid was aspirated and sent for culture  analysis. The nephrostomy tube was connected to gravity bag drainage. IMPRESSION: Puncture of the right renal collecting system revealed purulent urine return. Based on this finding, a nephrostomy tube was placed and a sample sent for culture analysis. Findings were discussed with Dr. Ronne Binning. Electronically Signed   By: Irish Lack M.D.   On: 01/03/2019 12:06    Labs:  CBC: Recent Labs    10/30/18 1437 01/03/19 0811  WBC 7.2 9.7  HGB 11.8* 12.2*  HCT 39.7 39.8  PLT 374 311    COAGS: Recent Labs    01/03/19 0830  INR 1.0    BMP: Recent Labs    10/30/18 1437 01/03/19 0811  NA 144 137  K 4.0 4.5  CL 111 105  CO2 23 24  GLUCOSE 102* 100*  BUN 38* 29*  CALCIUM 9.1 8.9  CREATININE 0.99 0.91  GFRNONAA >60 >60  GFRAA >60 >60    LIVER FUNCTION TESTS: No results for input(s): BILITOT, AST, ALT, ALKPHOS, PROT, ALBUMIN in the last 8760 hours.  Assessment and Plan: Pt with cerebral palsy, right XGP/renal stone; s/p right PCN 11/9; afebrile; no new labs today; PCN urine cx pend(gm neg rods); plans as per urology   Electronically Signed: D. Jeananne Rama, PA-C 01/05/2019, 10:28 AM   I spent a total of 15 minutes at the the patient's bedside AND on the patient's hospital floor or unit, greater than 50% of which was counseling/coordinating care for right nephrostomy    Patient ID: [name], male   DOB: 1960-04-29, 58 y.o.   MRN: [id number]

## 2019-01-06 LAB — URINE CULTURE: Culture: >=100000 — AB

## 2019-01-06 MED ORDER — OXYCODONE-ACETAMINOPHEN 5-325 MG PO TABS: 1.0000 | ORAL_TABLET | ORAL | 0 refills | Status: DC | PRN

## 2019-01-06 MED ORDER — SULFAMETHOXAZOLE-TRIMETHOPRIM 800-160 MG PO TABS: 1.0000 | ORAL_TABLET | Freq: Two times a day (BID) | ORAL | 0 refills | Status: DC

## 2019-01-06 NOTE — Discharge Instructions (Signed)
Percutaneous Nephrostomy ° °Percutaneous nephrostomy is a procedure to insert a flexible tube into your kidney so that urine can leave your body. This procedure may be done if a medical condition prevents urine from leaving your kidney in the usual way. Urine is normally carried from the kidneys to the bladder through narrow tubes called ureters. A ureter can become blocked because of conditions such as kidney stones, tumors, infection, or blood clots. °The nephrostomy tube will be inserted through your back. After the procedure, the tube will remain in place, and urine will drain from the kidney into a drainage bag outside your body. Draining the urine will relieve pressure and help prevent infection that could damage the kidney. Often, this procedure allows your health care provider to identify the cause of the blockage and plan appropriate treatment. °Tell a health care provider about: °· Any allergies you have. °· All medicines you are taking, including vitamins, herbs, eye drops, creams, and over-the-counter medicines. °· Any problems you or family members have had with anesthetic medicines. °· Any blood disorders you have. °· Any surgeries you have had. °· Any medical conditions you have. °· Whether you are pregnant or may be pregnant. °What are the risks? °Generally, this is a safe procedure. However, problems may occur, including: °· Infection. °· Bleeding. °· Allergic reactions to medicines or dyes used in the procedure. °· Damage to other structures or organs. °What happens before the procedure? °· Follow instructions from your health care provider about eating or drinking restrictions. °· Ask your health care provider about: °? Changing or stopping your regular medicines. This is especially important if you are taking diabetes medicines or blood thinners. °? Taking medicines such as aspirin and ibuprofen. These medicines can thin your blood. Do not take these medicines before your procedure if your health  care provider instructs you not to. °· You may be given antibiotic medicine to help prevent infection. °· You may have blood tests to see how well your kidneys and liver are working and to see how well your blood can clot. °· Plan to have someone take you home from the hospital or clinic. °What happens during the procedure? °· To reduce your risk of infection: °? Your health care team will wash or sanitize their hands. °? Your skin will be washed with soap. °· An IV tube will be inserted into one of your veins. You may be given medicines through this IV tube to help prevent nausea and pain. °· You will be positioned on your abdomen. °· You will be given one or more of the following: °? A medicine to help you relax (sedative). °? A medicine to numb the area (local anesthetic) where the nephrostomy tube will be inserted. °· The nephrostomy tube, which is thin and flexible, will be inserted into a needle. °· The needle will be inserted into your body and guided to your kidney. An imaging method that uses X-ray images (fluoroscopy) will be used to help guide the needle to the kidney. °· A dye will be injected through the nephrostomy tube. Then, X-ray images that highlight your kidney will be taken. °· Next, the needle will be removed, but the nephrostomy tube will be left in your kidney. The tube may be secured to your skin with stitches (sutures). °· A drainage bag will be attached to the nephrostomy tube. Urine will be able to drain from your kidney to this drainage bag outside your body. °The procedure may vary among health care providers   and hospitals. °What happens after the procedure? °· Your blood pressure, heart rate, breathing rate, and blood oxygen level will be monitored until the medicines you were given have worn off. °· You will need to remain lying down for several hours. °· You will be taught how to care for the nephrostomy tube and the drainage bag. °· Do not drive for 24 hours if you were given a  sedative. °This information is not intended to replace advice given to you by your health care provider. Make sure you discuss any questions you have with your health care provider. °Document Released: 12/01/2012 Document Revised: 01/23/2017 Document Reviewed: 11/23/2015 °Elsevier Patient Education © 2020 [employer] ° °

## 2019-01-06 NOTE — Progress Notes (Signed)
Referring Physician(s): Alyson Ingles Candee Furbish  Supervising Physician: Sandi Mariscal  Patient Status:  [facility] - In-pt  Chief Complaint: None  Subjective:  History of obstructing calculus of right renal pelvis/UPJ s/p percutaneous right nephrostomy tube placement in IR 01/03/2019 by Dr. Kathlene Cote. Patient awake and alert laying in bed. Accompanied by sister at bedside. Right nephrostomy tube site c/d/i.   Allergies: Patient has no known allergies.  Medications: Prior to Admission medications   Medication Sig Start Date End Date Taking? Authorizing Provider  acetaminophen (TYLENOL) 325 MG tablet Take 325 mg by mouth every 6 (six) hours as needed for moderate pain or headache.   Yes [provider]  liver oil-zinc oxide (DESITIN) 40 % ointment Apply 1 application topically as needed for irritation.   Yes [provider]  oxyCODONE-acetaminophen (PERCOCET/ROXICET) 5-325 MG tablet Take 1-2 tablets by mouth every 4 (four) hours as needed for moderate pain. 01/06/19   McKenzie, Candee Furbish, MD  sulfamethoxazole-trimethoprim (BACTRIM DS) 800-160 MG tablet Take 1 tablet by mouth 2 (two) times daily. 01/06/19   McKenzie, Candee Furbish, MD     Vital Signs: BP (!) 178/86 (BP Location: Right Leg)   Pulse (!) 102   Temp 98.6 F (37 C) (Oral)   Resp 16   Ht 5\' 6"  (1.676 m)   Wt 125 lb 10.6 oz (57 kg)   SpO2 96%   BMI 20.28 kg/m   Physical Exam Vitals signs and nursing note reviewed.  Constitutional:      General: He is not in acute distress.    Appearance: Normal appearance.  Pulmonary:     Effort: Pulmonary effort is normal. No respiratory distress.  Abdominal:     Comments: Right nephrostomy tube site without tenderness, erythema, drainage, or active bleeding; approximately 25 cc purulent fluid in gravity bag.  Skin:    General: Skin is warm and dry.  Neurological:     Mental Status: He is alert.     Imaging: Ir Nephrostomy Placement Right  Result Date:  01/03/2019 CLINICAL DATA:  Obstructing calculus of right renal pelvis and UPJ with planned percutaneous nephrolithotomy procedure after renal and ureteral access. EXAM: 1. ULTRASOUND GUIDANCE FOR PUNCTURE OF THE RIGHT RENAL COLLECTING SYSTEM. 2. RIGHT PERCUTANEOUS NEPHROSTOMY TUBE PLACEMENT. COMPARISON:  CT of the abdomen and pelvis on 12/02/2018 ANESTHESIA/SEDATION: 1.75 mg IV Versed; 50 mcg IV Fentanyl. Total Moderate Sedation Time 44 minutes. The patient's level of consciousness and physiologic status were continuously monitored during the procedure by Radiology nursing. CONTRAST:  15 ml Omnipaque 300 MEDICATIONS: 2 g IV Rocephin. Antibiotic was administered in an appropriate time frame prior to skin puncture. FLUOROSCOPY TIME:  7 minutes and 24 seconds. 93.2 mGy. PROCEDURE: The procedure, risks, benefits, and alternatives were explained to the patient. Questions regarding the procedure were encouraged and answered. The patient understands and consents to the procedure. A time-out was performed prior to initiating the procedure. The right flank region was prepped with chlorhexidine in a sterile fashion, and a sterile drape was applied covering the operative field. A sterile gown and sterile gloves were used for the procedure. Local anesthesia was provided with 1% Lidocaine. Ultrasound was used to localize the right kidney. Under direct ultrasound guidance, a 21 gauge needle was advanced into the renal collecting system. Ultrasound image documentation was performed. Aspiration of urine sample was performed followed by contrast injection. A second 21 gauge needle was utilized in puncturing a lower pole calyx under fluoroscopy. After aspiration of urine, contrast was injected.  A transitional dilator was advanced over a guidewire. Percutaneous tract dilatation was then performed over the guidewire. A 8.5-French percutaneous nephrostomy tube was then advanced and formed in the collecting system. Catheter position was  confirmed by fluoroscopy after contrast injection. The catheter was secured at the skin with a Prolene retention suture and Stat-Lock device. A gravity bag was placed. COMPLICATIONS: None. FINDINGS: Under ultrasound guidance, central puncture of the collecting system was performed for initial opacification. There was immediate return of whitish purulent appearing fluid and contrast injection demonstrated a very distorted appearing collecting system. There was some flow of contrast into the ureter around the known central calculi. The pelvic and UPJ calculus is not radiopaque by x-ray. Additional lower pole access was performed more peripherally in the kidney allowing placement of a nephrostomy tube which was partially formed in the renal pelvis and extends into a lower pole infundibulum. A sample of purulent and bloody fluid was aspirated and sent for culture analysis. The nephrostomy tube was connected to gravity bag drainage. IMPRESSION: Puncture of the right renal collecting system revealed purulent urine return. Based on this finding, a nephrostomy tube was placed and a sample sent for culture analysis. Findings were discussed with Dr. Ronne Binning. Electronically Signed   By: Irish Lack M.D.   On: 01/03/2019 12:06    Labs:  CBC: Recent Labs    10/30/18 1437 01/03/19 0811  WBC 7.2 9.7  HGB 11.8* 12.2*  HCT 39.7 39.8  PLT 374 311    COAGS: Recent Labs    01/03/19 0830  INR 1.0    BMP: Recent Labs    10/30/18 1437 01/03/19 0811  NA 144 137  K 4.0 4.5  CL 111 105  CO2 23 24  GLUCOSE 102* 100*  BUN 38* 29*  CALCIUM 9.1 8.9  CREATININE 0.99 0.91  GFRNONAA >60 >60  GFRAA >60 >60     Assessment and Plan:  History of obstructing calculus of right renal pelvis/UPJ s/p percutaneous right nephrostomy tube placement in IR 01/03/2019 by Dr. Fredia Sorrow. Right nephrostomy tube stable with approximately 25 cc purulent fluid in gravity bag. Continue current drain management- continue  with Qshift flushes/monitor of output. Further plans per urology- appreciate and agree with management. Please call IR with questions/concerns.   Electronically Signed: Elwin Mocha, PA-C 01/06/2019, 3:44 PM   I spent a total of 25 Minutes at the the patient's bedside AND on the patient's hospital floor or unit, greater than 50% of which was counseling/coordinating care for obstructing right renal calculus s/p right nephrostomy tube placement.

## 2019-01-06 NOTE — Progress Notes (Signed)
3 Days Post-Op Subjective: Patient afebrile. No pain. Urine culture pending  Objective: Vital signs in last 24 hours: Temp:  [98.6 F (37 C)-98.8 F (37.1 C)] 98.6 F (37 C) (11/12 0457) Pulse Rate:  [93-102] 102 (11/12 1316) Resp:  [16] 16 (11/12 0457) BP: (95-178)/(63-90) 178/86 (11/12 1316) SpO2:  [95 %-96 %] 96 % (11/12 1316)  Intake/Output from previous day: 11/11 0701 - 11/12 0700 In: 2516.3 [P.O.:650; I.V.:1766.3; IV Piggyback:100] Out: 380 [Urine:380] Intake/Output this shift: Total I/O In: 360 [P.O.:360] Out: -   Physical Exam:  General:alert and appears stated age GI: soft, non tender, normal bowel sounds, no palpable masses, no organomegaly, no inguinal hernia Male genitalia: not done Extremities: extremities normal, atraumatic, no cyanosis or edema  Lab Results: No results for input(s): HGB, HCT in the last 72 hours. BMET No results for input(s): NA, K, CL, CO2, GLUCOSE, BUN, CREATININE, CALCIUM in the last 72 hours. No results for input(s): LABPT, INR in the last 72 hours. No results for input(s): LABURIN in the last 72 hours. Results for orders placed or performed during the hospital encounter of 01/03/19  Urine culture     Status: Abnormal   Collection Time: 01/03/19 11:31 AM   Specimen: Kidney; Urine  Result Value Ref Range Status   Specimen Description   Final    KIDNEY RIGHT Performed at [facility] [address], [place], [place] [address]    Special Requests   Final    NONE Performed at [facility] [place] [address], [place], [place] [address]    Culture >=100,000 COLONIES/mL PROTEUS MIRABILIS (A)  Final   Report Status 01/06/2019 FINAL  Final   Organism ID, Bacteria PROTEUS MIRABILIS (A)  Final      Susceptibility   Proteus mirabilis - MIC*    AMPICILLIN <=2 SENSITIVE Sensitive     CEFAZOLIN <=4 SENSITIVE Sensitive     CEFEPIME <=1 SENSITIVE Sensitive     CEFTAZIDIME <=1 SENSITIVE Sensitive    CEFTRIAXONE <=1 SENSITIVE Sensitive     CIPROFLOXACIN <=0.25 SENSITIVE Sensitive     GENTAMICIN 2 SENSITIVE Sensitive     IMIPENEM 0.5 SENSITIVE Sensitive     TRIMETH/SULFA <=20 SENSITIVE Sensitive     AMPICILLIN/SULBACTAM <=2 SENSITIVE Sensitive     PIP/TAZO <=4 SENSITIVE Sensitive     * >=100,000 COLONIES/mL PROTEUS MIRABILIS    Studies/Results: No results found.  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 Continue broad spectrum antibiotics pending urine culture. Likely discharge tomorrow   LOS: 3 days   Nicolette Bang 01/06/2019, 1:48 PM

## 2019-01-07 NOTE — Discharge Summary (Signed)
Physician Discharge Summary  Patient ID: [name] MRN: [id number] DOB/AGE: August 10, 1960 58 y.o.  Admit date: 01/03/2019 Discharge date: 01/06/2019  Admission Diagnoses:  Renal calculus  Discharge Diagnoses:  Active Problems:   Renal calculus   Past Medical History:  Diagnosis Date  . Cerebral palsy (HCC)   . 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 male who was admitted 01/03/2019 with a diagnosis renal calculus and underwent nephrostomy tube placement. He was found to have purulent driange fromt he nephrostomy tube and surgery was cancelled. He was admitted for IV antibiotics. He remained afebrile by hospital day 3 and was discharged home on PO bactrim. He was given perioperative antibiotics:  Anti-infectives (From admission, onward)   Start     Dose/Rate Route Frequency Ordered Stop   01/06/19 0000  sulfamethoxazole-trimethoprim (BACTRIM DS) 800-160 MG tablet     1 tablet Oral 2 times daily 01/06/19 1351     01/04/19 1000  cefTRIAXone (ROCEPHIN) 2 g in sodium chloride 0.9 % 100 mL IVPB  Status:  Discontinued     2 g 200 mL/hr over 30 Minutes Intravenous Every 24 hours 01/03/19 1204 01/06/19 1836   01/03/19 0815  ciprofloxacin (CIPRO) IVPB 400 mg  Status:  Discontinued     400 mg 200 mL/hr over 60 Minutes Intravenous  Once 01/03/19 0810 01/03/19 0935   01/03/19 0810  cefTRIAXone (ROCEPHIN) 2 g in sodium chloride 0.9 % 100 mL IVPB     2 g 200 mL/hr over 30 Minutes Intravenous 30 min pre-op 01/03/19 0810 01/03/19 1049    .  He was given sequential compression devices for DVT prophylaxis.  He benefited maximally from the hospital stay and there were no complications.    Recent vital signs:  Vitals:   01/06/19 0457 01/06/19 1316  BP:  115/90 (!) 178/86  Pulse: 95 (!) 102  Resp: 16   Temp: 98.6 F (37 C)   SpO2: 96% 96%    Recent laboratory studies:  Lab Results  Component Value Date   HGB 12.2 (L) 01/03/2019   HGB 11.8 (L) 10/30/2018   HGB 13.9 02/26/2015   Lab Results  Component Value Date   WBC 9.7 01/03/2019   PLT 311 01/03/2019   Lab Results  Component Value Date   INR 1.0 01/03/2019   Lab Results  Component Value Date   NA 137 01/03/2019   K 4.5 01/03/2019   CL 105 01/03/2019   CO2 24 01/03/2019   BUN 29 (H) 01/03/2019   CREATININE 0.91 01/03/2019   GLUCOSE 100 (H) 01/03/2019    Discharge Medications:   Allergies as of 01/06/2019   No Known Allergies     Medication List    TAKE these medications   acetaminophen 325 MG tablet Commonly known as: TYLENOL Take 325 mg by mouth every 6 (six) hours as needed for moderate pain or headache.   liver oil-zinc oxide 40 % ointment Commonly known as: DESITIN Apply 1 application topically as needed for irritation.   oxyCODONE-acetaminophen 5-325 MG tablet Commonly known as: PERCOCET/ROXICET Take 1-2 tablets by mouth every 4 (four) hours as needed for moderate pain.   sulfamethoxazole-trimethoprim 800-160 MG tablet Commonly known as: BACTRIM DS Take 1 tablet by mouth 2 (two) times daily.  Diagnostic Studies: Ir Nephrostomy Placement Right  Result Date: 01/03/2019 CLINICAL DATA:  Obstructing calculus of right renal pelvis and UPJ with planned percutaneous nephrolithotomy procedure after renal and ureteral access. EXAM: 1. ULTRASOUND GUIDANCE FOR PUNCTURE OF THE RIGHT RENAL COLLECTING SYSTEM. 2. RIGHT PERCUTANEOUS NEPHROSTOMY TUBE PLACEMENT. COMPARISON:  CT of the abdomen and pelvis on 12/02/2018 ANESTHESIA/SEDATION: 1.75 mg IV Versed; 50 mcg IV Fentanyl. Total Moderate Sedation Time 44 minutes. The patient's level of consciousness and physiologic status were continuously monitored during the procedure by Radiology nursing. CONTRAST:  15 ml  Omnipaque 300 MEDICATIONS: 2 g IV Rocephin. Antibiotic was administered in an appropriate time frame prior to skin puncture. FLUOROSCOPY TIME:  7 minutes and 24 seconds. 93.2 mGy. PROCEDURE: The procedure, risks, benefits, and alternatives were explained to the patient. Questions regarding the procedure were encouraged and answered. The patient understands and consents to the procedure. A time-out was performed prior to initiating the procedure. The right flank region was prepped with chlorhexidine in a sterile fashion, and a sterile drape was applied covering the operative field. A sterile gown and sterile gloves were used for the procedure. Local anesthesia was provided with 1% Lidocaine. Ultrasound was used to localize the right kidney. Under direct ultrasound guidance, a 21 gauge needle was advanced into the renal collecting system. Ultrasound image documentation was performed. Aspiration of urine sample was performed followed by contrast injection. A second 21 gauge needle was utilized in puncturing a lower pole calyx under fluoroscopy. After aspiration of urine, contrast was injected. A transitional dilator was advanced over a guidewire. Percutaneous tract dilatation was then performed over the guidewire. A 8.5-French percutaneous nephrostomy tube was then advanced and formed in the collecting system. Catheter position was confirmed by fluoroscopy after contrast injection. The catheter was secured at the skin with a Prolene retention suture and Stat-Lock device. A gravity bag was placed. COMPLICATIONS: None. FINDINGS: Under ultrasound guidance, central puncture of the collecting system was performed for initial opacification. There was immediate return of whitish purulent appearing fluid and contrast injection demonstrated a very distorted appearing collecting system. There was some flow of contrast into the ureter around the known central calculi. The pelvic and UPJ calculus is not radiopaque by x-ray.  Additional lower pole access was performed more peripherally in the kidney allowing placement of a nephrostomy tube which was partially formed in the renal pelvis and extends into a lower pole infundibulum. A sample of purulent and bloody fluid was aspirated and sent for culture analysis. The nephrostomy tube was connected to gravity bag drainage. IMPRESSION: Puncture of the right renal collecting system revealed purulent urine return. Based on this finding, a nephrostomy tube was placed and a sample sent for culture analysis. Findings were discussed with Dr. Alyson Ingles. Electronically Signed   By: Aletta Edouard M.D.   On: 01/03/2019 12:06    Disposition:     Follow-up Information    Jaykwon Morones, Candee Furbish, MD. Call in 3 week(s).   Specialty: Urology Contact information: [address] [place] [address] [phone]            Signed: Nicolette Bang 01/07/2019, 8:02 AM

## 2019-01-28 ENCOUNTER — Other Ambulatory Visit: Payer: Self-pay | Admitting: Urology

## 2019-02-14 NOTE — Patient Instructions (Addendum)
DUE TO COVID-19 ONLY ONE VISITOR IS ALLOWED TO COME WITH YOU AND STAY IN THE WAITING ROOM ONLY DURING PRE OP AND PROCEDURE DAY OF SURGERY. THE 1 VISITOR MAY VISIT WITH YOU AFTER SURGERY IN YOUR PRIVATE ROOM DURING VISITING HOURS ONLY!  YOU NEED TO HAVE A COVID 19 TEST ON 02-21-19  @ 3:00 PM AT [facility] IN [place], [place]. ONCE YOUR COVID TEST IS COMPLETED, PLEASE BEGIN THE QUARANTINE INSTRUCTIONS AS OUTLINED IN YOUR HANDOUT.                Brett KenningRobert L Wise  02/14/2019   Your procedure is scheduled on: 02-24-19    [facility]    Report to Admitting at 11: 00 AM     Call this number if you have problems the morning of surgery [phone]    Remember: Please start your Clear Liquid Diet on the day of your prep per your surgeon's instructions. You may continue your Clear Liquid Diet from the day of your prep until 7:00 AM morning of surgery. After 7:00 AM, nothing until after surgery.      CLEAR LIQUID DIET   Foods Allowed                                                                     Foods Excluded  Coffee and tea, regular and decaf                             liquids that you cannot  Plain Jell-O any favor except red or purple                                           see through such as: Fruit ices (not with fruit pulp)                                     milk, soups, orange juice  Iced Popsicles                                    All solid food Carbonated beverages, regular and diet                                    Cranberry, grape and apple juices Sports drinks like Gatorade Lightly seasoned clear broth or consume(fat free) Sugar, honey syrup  Sample Menu Breakfast                                Lunch                                     Supper Cranberry juice  Beef broth                            Chicken broth Jell-O                                     Grape juice                           Apple juice Coffee  or tea                        Jell-O                                      Popsicle                                                Coffee or tea                        Coffee or tea  _____________________________________________________________________     Take these medicines the morning of surgery with A SIP OF WATER: None   BRUSH YOUR TEETH MORNING OF SURGERY AND RINSE YOUR MOUTH OUT, NO CHEWING GUM CANDY OR MINTS.                               You may not have any metal on your body including hair pins and              piercings     Do not wear jewelry, cologne, lotions, powders or deodorant                        Men may shave face and neck.   Do not bring valuables to the hospital. [facility] IS NOT             RESPONSIBLE   FOR VALUABLES.  Contacts, dentures or bridgework may not be worn into surgery.   You may have a bring in a suitcase or overnight bag    Special Instructions: N/A              Please read over the following fact sheets you were given: _____________________________________________________________________             [facility] - Preparing for Surgery Before surgery, you can play an important role.  Because skin is not sterile, your skin needs to be as free of germs as possible.  You can reduce the number of germs on your skin by washing with CHG (chlorahexidine gluconate) soap before surgery.  CHG is an antiseptic cleaner which kills germs and bonds with the skin to continue killing germs even after washing. Please DO NOT use if you have an allergy to CHG or antibacterial soaps.  If your skin becomes reddened/irritated stop using the CHG and inform your nurse when you arrive at Short Stay. Do not shave (including legs and underarms) for at least 48 hours prior to the first CHG shower.  You may  shave your face/neck. Please follow these instructions carefully:  1.  Shower with CHG Soap the night before surgery and the  morning of Surgery.  2.  If you  choose to wash your hair, wash your hair first as usual with your  normal  shampoo.  3.  After you shampoo, rinse your hair and body thoroughly to remove the  shampoo.                           4.  Use CHG as you would any other liquid soap.  You can apply chg directly  to the skin and wash                       Gently with a scrungie or clean washcloth.  5.  Apply the CHG Soap to your body ONLY FROM THE NECK DOWN.   Do not use on face/ open                           Wound or open sores. Avoid contact with eyes, ears mouth and genitals (private parts).                       Wash face,  Genitals (private parts) with your normal soap.             6.  Wash thoroughly, paying special attention to the area where your surgery  will be performed.  7.  Thoroughly rinse your body with warm water from the neck down.  8.  DO NOT shower/wash with your normal soap after using and rinsing off  the CHG Soap.                9.  Pat yourself dry with a clean towel.            10.  Wear clean pajamas.            11.  Place clean sheets on your bed the night of your first shower and do not  sleep with pets. Day of Surgery : Do not apply any lotions/deodorants the morning of surgery.  Please wear clean clothes to the hospital/surgery center.  FAILURE TO FOLLOW THESE INSTRUCTIONS MAY RESULT IN THE CANCELLATION OF YOUR SURGERY PATIENT SIGNATURE_________________________________  NURSE SIGNATURE__________________________________  ________________________________________________________________________

## 2019-02-15 ENCOUNTER — Encounter (HOSPITAL_COMMUNITY): Admission: RE | Admit: 2019-02-15 | Payer: Medicare Other | Source: Ambulatory Visit

## 2019-02-15 ENCOUNTER — Encounter (HOSPITAL_COMMUNITY)
Admission: RE | Admit: 2019-02-15 | Discharge: 2019-02-15 | Disposition: A | Discharge status: Home / Self Care | Payer: Medicare Other | Source: Ambulatory Visit | Attending: Urology | Admitting: Urology

## 2019-02-15 ENCOUNTER — Other Ambulatory Visit: Payer: Self-pay

## 2019-02-15 ENCOUNTER — Encounter (HOSPITAL_COMMUNITY): Payer: Self-pay

## 2019-02-15 DIAGNOSIS — Z01812 Encounter for preprocedural laboratory examination: Secondary | ICD-10-CM | POA: Diagnosis present

## 2019-02-15 HISTORY — DX: Nausea with vomiting, unspecified: R11.2

## 2019-02-15 HISTORY — DX: Other specified postprocedural states: Z98.890

## 2019-02-15 HISTORY — DX: Other complications of anesthesia, initial encounter: T88.59XA

## 2019-02-15 NOTE — Progress Notes (Signed)
History of Cerebral Palsy, and limited verbal interaction. Pt's sister, Odie Sera, is his  care provider. History information provided by Ms. McPherson. Pt doesn't have a court appointment guardian.

## 2019-02-16 ENCOUNTER — Other Ambulatory Visit: Payer: Self-pay

## 2019-02-21 ENCOUNTER — Other Ambulatory Visit: Payer: Self-pay

## 2019-02-21 ENCOUNTER — Other Ambulatory Visit (HOSPITAL_COMMUNITY)
Admission: RE | Admit: 2019-02-21 | Discharge: 2019-02-21 | Disposition: A | Discharge status: Home / Self Care | Payer: Medicare Other | Source: Ambulatory Visit | Attending: Urology | Admitting: Urology

## 2019-02-21 DIAGNOSIS — Z01812 Encounter for preprocedural laboratory examination: Secondary | ICD-10-CM | POA: Insufficient documentation

## 2019-02-21 DIAGNOSIS — Z20828 Contact with and (suspected) exposure to other viral communicable diseases: Secondary | ICD-10-CM | POA: Insufficient documentation

## 2019-02-22 ENCOUNTER — Encounter (HOSPITAL_COMMUNITY)
Admission: RE | Admit: 2019-02-22 | Discharge: 2019-02-22 | Disposition: A | Discharge status: Home / Self Care | Payer: Medicare Other | Source: Ambulatory Visit | Attending: Urology | Admitting: Urology

## 2019-02-22 ENCOUNTER — Other Ambulatory Visit: Payer: Self-pay

## 2019-02-22 DIAGNOSIS — Z01812 Encounter for preprocedural laboratory examination: Secondary | ICD-10-CM | POA: Insufficient documentation

## 2019-02-22 LAB — BASIC METABOLIC PANEL
Anion gap: 10 (ref 5–15)
BUN: 29 mg/dL — ABNORMAL HIGH (ref 6–20)
CO2: 24 mmol/L (ref 22–32)
Calcium: 8.9 mg/dL (ref 8.9–10.3)
Chloride: 105 mmol/L (ref 98–111)
Creatinine, Ser: 0.9 mg/dL (ref 0.61–1.24)
GFR calc Af Amer: >60 mL/min (ref >60)
GFR calc non Af Amer: >60 mL/min (ref >60)
Glucose, Bld: 90 mg/dL (ref 70–99)
Potassium: 4.4 mmol/L (ref 3.5–5.1)
Sodium: 139 mmol/L (ref 135–145)

## 2019-02-22 LAB — CBC
HCT: 41.7 % (ref 39.0–52.0)
Hemoglobin: 13 g/dL (ref 13.0–17.0)
MCH: 28.1 pg (ref 26.0–34.0)
MCHC: 31.2 g/dL (ref 30.0–36.0)
MCV: 90.3 fL (ref 80.0–100.0)
Platelets: 334 10*3/uL (ref 150–400)
RBC: 4.62 MIL/uL (ref 4.22–5.81)
RDW: 14.2 % (ref 11.5–15.5)
WBC: 10.5 10*3/uL (ref 4.0–10.5)
nRBC: 0 % (ref 0.0–0.2)

## 2019-02-22 LAB — SARS CORONAVIRUS 2 (TAT 6-24 HRS): SARS Coronavirus 2: NEGATIVE

## 2019-02-23 NOTE — Anesthesia Preprocedure Evaluation (Addendum)
Anesthesia Evaluation  Patient identified by MRN, date of birth, ID band Patient awake    Reviewed: Allergy & Precautions, NPO status , Patient's Chart, lab work & pertinent test results  History of Anesthesia Complications (+) PONV and history of anesthetic complications  Airway Mallampati: I   Neck ROM: Full    Dental  (+) Edentulous Upper, Edentulous Lower, Dental Advisory Given   Pulmonary neg pulmonary ROS,    Pulmonary exam normal        Cardiovascular negative cardio ROS Normal cardiovascular exam     Neuro/Psych Cerebral Palsy   negative neurological ROS     GI/Hepatic negative GI ROS, Neg liver ROS,   Endo/Other  negative endocrine ROS  Renal/GU negative Renal ROS     Musculoskeletal negative musculoskeletal ROS (+)   Abdominal   Peds  Hematology negative hematology ROS (+)   Anesthesia Other Findings Day of surgery medications reviewed with the patient.  Reproductive/Obstetrics                            Anesthesia Physical Anesthesia Plan  ASA: III  Anesthesia Plan: General   Post-op Pain Management:    Induction: Intravenous  PONV Risk Score and Plan: 4 or greater and Ondansetron, Dexamethasone, Midazolam and Scopolamine patch - Pre-op  Airway Management Planned: Oral ETT  Additional Equipment:   Intra-op Plan:   Post-operative Plan: Extubation in OR  Informed Consent: I have reviewed the patients History and Physical, chart, labs and discussed the procedure including the risks, benefits and alternatives for the proposed anesthesia with the patient or authorized representative who has indicated his/her understanding and acceptance.     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 Non-verbal,  communicates with hand signs.  Sister is caregiver.  Per sister patient can transfer himself.  Last seen by PCP 04/02/2016, stable at this time.  In the process of establishing care with new provider.  Per nurse interview sister is primary caregiver, but is not POA.  Sister states he does not have a POA.  She has historically signed consents for him.  He can communicate and understand explanation of procedures.  Previously scheduled for PCNL, procedure cancelled after a left nephrostomy tube was placed by IR and purulent drainage was noted from the nephrostomy tube.  Admitted for IV antibiotics 01/03/2019-01/06/2019.  He remained afebrile by hospital day 3 and was discharged home on PO bactrim.Marland Kitchen )    Anesthesia Quick Evaluation

## 2019-02-24 ENCOUNTER — Inpatient Hospital Stay (HOSPITAL_COMMUNITY)
Admission: RE | Admit: 2019-02-24 | Discharge: 2019-03-02 | DRG: 660 | Disposition: A | Discharge status: Home / Self Care | Payer: Medicare Other | Attending: Urology | Admitting: Urology

## 2019-02-24 ENCOUNTER — Inpatient Hospital Stay (HOSPITAL_COMMUNITY): Payer: Medicare Other | Admitting: Physician Assistant

## 2019-02-24 ENCOUNTER — Encounter (HOSPITAL_COMMUNITY): Payer: Self-pay | Admitting: Urology

## 2019-02-24 ENCOUNTER — Encounter (HOSPITAL_COMMUNITY)
Admission: RE | Disposition: A | Discharge status: Home / Self Care | Payer: Self-pay | Source: Ambulatory Visit | Attending: Urology

## 2019-02-24 DIAGNOSIS — G809 Cerebral palsy, unspecified: Secondary | ICD-10-CM | POA: Diagnosis present

## 2019-02-24 DIAGNOSIS — R198 Other specified symptoms and signs involving the digestive system and abdomen: Secondary | ICD-10-CM

## 2019-02-24 DIAGNOSIS — K592 Neurogenic bowel, not elsewhere classified: Secondary | ICD-10-CM | POA: Diagnosis present

## 2019-02-24 DIAGNOSIS — K660 Peritoneal adhesions (postprocedural) (postinfection): Secondary | ICD-10-CM | POA: Diagnosis present

## 2019-02-24 DIAGNOSIS — N200 Calculus of kidney: Secondary | ICD-10-CM | POA: Diagnosis present

## 2019-02-24 DIAGNOSIS — Z87442 Personal history of urinary calculi: Secondary | ICD-10-CM

## 2019-02-24 DIAGNOSIS — N289 Disorder of kidney and ureter, unspecified: Secondary | ICD-10-CM | POA: Diagnosis present

## 2019-02-24 DIAGNOSIS — Z8249 Family history of ischemic heart disease and other diseases of the circulatory system: Secondary | ICD-10-CM

## 2019-02-24 DIAGNOSIS — Z20822 Contact with and (suspected) exposure to covid-19: Secondary | ICD-10-CM | POA: Diagnosis present

## 2019-02-24 DIAGNOSIS — N118 Other chronic tubulo-interstitial nephritis: Secondary | ICD-10-CM | POA: Diagnosis present

## 2019-02-24 DIAGNOSIS — Z833 Family history of diabetes mellitus: Secondary | ICD-10-CM | POA: Diagnosis not present

## 2019-02-24 HISTORY — PX: ROBOT ASSISTED LAPAROSCOPIC NEPHRECTOMY: SHX5140

## 2019-02-24 LAB — ABO/RH: ABO/RH(D): O POS

## 2019-02-24 LAB — TYPE AND SCREEN
ABO/RH(D): O POS
Antibody Screen: NEGATIVE

## 2019-02-24 LAB — HEMOGLOBIN AND HEMATOCRIT, BLOOD
HCT: 38.7 % — ABNORMAL LOW (ref 39.0–52.0)
Hemoglobin: 11.7 g/dL — ABNORMAL LOW (ref 13.0–17.0)

## 2019-02-24 SURGERY — NEPHRECTOMY, RADICAL, ROBOT-ASSISTED, LAPAROSCOPIC, ADULT
Anesthesia: General | Laterality: Right

## 2019-02-24 MED ORDER — SODIUM CHLORIDE 0.9% FLUSH
INTRAVENOUS | Status: DC | PRN
  Administered 2019-02-24: 20 mL

## 2019-02-24 MED ORDER — DEXAMETHASONE SODIUM PHOSPHATE 10 MG/ML IJ SOLN
INTRAMUSCULAR | Status: DC | PRN
  Administered 2019-02-24: 5 mg via INTRAVENOUS

## 2019-02-24 MED ORDER — LIDOCAINE 2% (20 MG/ML) 5 ML SYRINGE
INTRAMUSCULAR | Status: AC
  Filled 2019-02-24: qty 5

## 2019-02-24 MED ORDER — PROPOFOL 10 MG/ML IV BOLUS
INTRAVENOUS | Status: DC | PRN
  Administered 2019-02-24: 160 ug via INTRAVENOUS

## 2019-02-24 MED ORDER — LACTATED RINGERS IV SOLN: INTRAVENOUS | Status: DC

## 2019-02-24 MED ORDER — BUPIVACAINE LIPOSOME 1.3 % IJ SUSP
20.0000 mL | Freq: Once | INTRAMUSCULAR | Status: AC
  Administered 2019-02-24: 20 mL
  Filled 2019-02-24 (×2): qty 20

## 2019-02-24 MED ORDER — CELECOXIB 200 MG PO CAPS
400.0000 mg | ORAL_CAPSULE | Freq: Once | ORAL | Status: AC
  Administered 2019-02-24: 400 mg via ORAL
  Filled 2019-02-24: qty 2

## 2019-02-24 MED ORDER — LIDOCAINE HCL (CARDIAC) PF 100 MG/5ML IV SOSY
PREFILLED_SYRINGE | INTRAVENOUS | Status: DC | PRN
  Administered 2019-02-24: 60 mg via INTRATRACHEAL

## 2019-02-24 MED ORDER — ALBUMIN HUMAN 5 % IV SOLN: INTRAVENOUS | Status: DC | PRN

## 2019-02-24 MED ORDER — BACITRACIN-NEOMYCIN-POLYMYXIN 400-5-5000 EX OINT: 1.0000 "application " | TOPICAL_OINTMENT | Freq: Three times a day (TID) | CUTANEOUS | Status: DC | PRN

## 2019-02-24 MED ORDER — MIDAZOLAM HCL 2 MG/2ML IJ SOLN
INTRAMUSCULAR | Status: DC | PRN
  Administered 2019-02-24: 1 mg via INTRAVENOUS

## 2019-02-24 MED ORDER — SCOPOLAMINE 1 MG/3DAYS TD PT72
1.0000 | MEDICATED_PATCH | TRANSDERMAL | Status: DC
  Administered 2019-02-24: 1.5 mg via TRANSDERMAL
  Filled 2019-02-24: qty 1

## 2019-02-24 MED ORDER — DEXAMETHASONE SODIUM PHOSPHATE 10 MG/ML IJ SOLN
INTRAMUSCULAR | Status: AC
  Filled 2019-02-24: qty 1

## 2019-02-24 MED ORDER — PROMETHAZINE HCL 25 MG/ML IJ SOLN: 6.2500 mg | INTRAMUSCULAR | Status: DC | PRN

## 2019-02-24 MED ORDER — MIDAZOLAM HCL 2 MG/2ML IJ SOLN
INTRAMUSCULAR | Status: AC
  Filled 2019-02-24: qty 2

## 2019-02-24 MED ORDER — ACETAMINOPHEN 500 MG PO TABS
1000.0000 mg | ORAL_TABLET | Freq: Once | ORAL | Status: AC
  Administered 2019-02-24: 1000 mg via ORAL
  Filled 2019-02-24: qty 2

## 2019-02-24 MED ORDER — HYDROMORPHONE HCL 1 MG/ML IJ SOLN: 0.5000 mg | INTRAMUSCULAR | Status: DC | PRN

## 2019-02-24 MED ORDER — ONDANSETRON HCL 4 MG/2ML IJ SOLN
4.0000 mg | INTRAMUSCULAR | Status: DC | PRN
  Administered 2019-02-26 – 2019-03-01 (×5): 4 mg via INTRAVENOUS
  Filled 2019-02-24 (×5): qty 2

## 2019-02-24 MED ORDER — ONDANSETRON HCL 4 MG/2ML IJ SOLN
INTRAMUSCULAR | Status: AC
  Filled 2019-02-24: qty 2

## 2019-02-24 MED ORDER — SODIUM CHLORIDE (PF) 0.9 % IJ SOLN
INTRAMUSCULAR | Status: AC
  Filled 2019-02-24: qty 20

## 2019-02-24 MED ORDER — PHENYLEPHRINE HCL (PRESSORS) 10 MG/ML IV SOLN
INTRAVENOUS | Status: AC
  Filled 2019-02-24: qty 1

## 2019-02-24 MED ORDER — MAGNESIUM CITRATE PO SOLN: 1.0000 | Freq: Once | ORAL | Status: DC

## 2019-02-24 MED ORDER — DIPHENHYDRAMINE HCL 12.5 MG/5ML PO ELIX: 12.5000 mg | ORAL_SOLUTION | Freq: Four times a day (QID) | ORAL | Status: DC | PRN

## 2019-02-24 MED ORDER — PROPOFOL 500 MG/50ML IV EMUL
INTRAVENOUS | Status: DC | PRN
  Administered 2019-02-24: 150 ug/kg/min via INTRAVENOUS

## 2019-02-24 MED ORDER — ROCURONIUM BROMIDE 100 MG/10ML IV SOLN
INTRAVENOUS | Status: DC | PRN
  Administered 2019-02-24: 20 mg via INTRAVENOUS
  Administered 2019-02-24: 10 mg via INTRAVENOUS
  Administered 2019-02-24: 60 mg via INTRAVENOUS

## 2019-02-24 MED ORDER — FENTANYL CITRATE (PF) 100 MCG/2ML IJ SOLN
INTRAMUSCULAR | Status: AC
  Administered 2019-02-24: 50 ug via INTRAVENOUS
  Filled 2019-02-24: qty 2

## 2019-02-24 MED ORDER — LACTATED RINGERS IV SOLN: INTRAVENOUS | Status: DC | PRN

## 2019-02-24 MED ORDER — OXYCODONE HCL 5 MG PO TABS
5.0000 mg | ORAL_TABLET | ORAL | Status: DC | PRN
  Administered 2019-02-24 – 2019-02-26 (×2): 5 mg via ORAL
  Filled 2019-02-24 (×2): qty 1

## 2019-02-24 MED ORDER — PROPOFOL 500 MG/50ML IV EMUL
INTRAVENOUS | Status: AC
  Filled 2019-02-24: qty 100

## 2019-02-24 MED ORDER — ALBUMIN HUMAN 5 % IV SOLN
INTRAVENOUS | Status: AC
  Filled 2019-02-24: qty 250

## 2019-02-24 MED ORDER — FENTANYL CITRATE (PF) 100 MCG/2ML IJ SOLN: 25.0000 ug | INTRAMUSCULAR | Status: DC | PRN

## 2019-02-24 MED ORDER — PHENYLEPHRINE 40 MCG/ML (10ML) SYRINGE FOR IV PUSH (FOR BLOOD PRESSURE SUPPORT)
PREFILLED_SYRINGE | INTRAVENOUS | Status: AC
  Filled 2019-02-24: qty 10

## 2019-02-24 MED ORDER — PROPOFOL 10 MG/ML IV BOLUS
INTRAVENOUS | Status: AC
  Filled 2019-02-24: qty 20

## 2019-02-24 MED ORDER — FENTANYL CITRATE (PF) 250 MCG/5ML IJ SOLN
INTRAMUSCULAR | Status: AC
  Filled 2019-02-24: qty 5

## 2019-02-24 MED ORDER — BELLADONNA ALKALOIDS-OPIUM 16.2-60 MG RE SUPP: 1.0000 | Freq: Four times a day (QID) | RECTAL | Status: DC | PRN

## 2019-02-24 MED ORDER — DIPHENHYDRAMINE HCL 50 MG/ML IJ SOLN: 12.5000 mg | Freq: Four times a day (QID) | INTRAMUSCULAR | Status: DC | PRN

## 2019-02-24 MED ORDER — PHENYLEPHRINE 40 MCG/ML (10ML) SYRINGE FOR IV PUSH (FOR BLOOD PRESSURE SUPPORT)
PREFILLED_SYRINGE | INTRAVENOUS | Status: DC | PRN
  Administered 2019-02-24 (×4): 80 ug via INTRAVENOUS

## 2019-02-24 MED ORDER — PROPOFOL 500 MG/50ML IV EMUL
INTRAVENOUS | Status: AC
  Filled 2019-02-24: qty 50

## 2019-02-24 MED ORDER — FENTANYL CITRATE (PF) 250 MCG/5ML IJ SOLN
INTRAMUSCULAR | Status: DC | PRN
  Administered 2019-02-24: 50 ug via INTRAVENOUS
  Administered 2019-02-24: 100 ug via INTRAVENOUS

## 2019-02-24 MED ORDER — DEXTROSE-NACL 5-0.45 % IV SOLN: INTRAVENOUS | Status: DC

## 2019-02-24 MED ORDER — OXYCODONE-ACETAMINOPHEN 5-325 MG PO TABS: 1.0000 | ORAL_TABLET | Freq: Four times a day (QID) | ORAL | 0 refills | Status: DC | PRN

## 2019-02-24 MED ORDER — ACETAMINOPHEN 325 MG PO TABS
650.0000 mg | ORAL_TABLET | ORAL | Status: DC | PRN
  Administered 2019-02-25 – 2019-02-26 (×3): 650 mg via ORAL
  Filled 2019-02-24 (×3): qty 2

## 2019-02-24 MED ORDER — SODIUM CHLORIDE 0.9 % IV SOLN
2.0000 g | INTRAVENOUS | Status: AC
  Administered 2019-02-24: 2 g via INTRAVENOUS
  Filled 2019-02-24: qty 20

## 2019-02-24 MED ORDER — DOCUSATE SODIUM 100 MG PO CAPS
100.0000 mg | ORAL_CAPSULE | Freq: Two times a day (BID) | ORAL | Status: DC
  Administered 2019-02-24 – 2019-02-25 (×3): 100 mg via ORAL
  Filled 2019-02-24 (×3): qty 1

## 2019-02-24 MED ORDER — ROCURONIUM BROMIDE 10 MG/ML (PF) SYRINGE
PREFILLED_SYRINGE | INTRAVENOUS | Status: AC
  Filled 2019-02-24: qty 10

## 2019-02-24 MED ORDER — SODIUM CHLORIDE 0.9 % IV SOLN
1.0000 g | INTRAVENOUS | Status: DC
  Administered 2019-02-25 – 2019-03-02 (×6): 1 g via INTRAVENOUS
  Filled 2019-02-24 (×6): qty 1

## 2019-02-24 MED ORDER — SUGAMMADEX SODIUM 200 MG/2ML IV SOLN
INTRAVENOUS | Status: DC | PRN
  Administered 2019-02-24: 200 mg via INTRAVENOUS

## 2019-02-24 MED ORDER — PHENYLEPHRINE HCL-NACL 10-0.9 MG/250ML-% IV SOLN
INTRAVENOUS | Status: DC | PRN
  Administered 2019-02-24: 50 ug/min via INTRAVENOUS

## 2019-02-24 SURGICAL SUPPLY — 66 items
BAG LAPAROSCOPIC 12 15 PORT 16 (BASKET) ×1 IMPLANT
BAG RETRIEVAL 12/15 (BASKET) ×2
CATH FOLEY 2WAY SLVR  5CC 14FR (CATHETERS) ×1
CATH FOLEY 2WAY SLVR 5CC 14FR (CATHETERS) ×1 IMPLANT
CHLORAPREP W/TINT 26 (MISCELLANEOUS) ×2 IMPLANT
CLIP VESOLOCK LG 6/CT PURPLE (CLIP) ×2 IMPLANT
CLIP VESOLOCK MED LG 6/CT (CLIP) ×2 IMPLANT
CLIP VESOLOCK XL 6/CT (CLIP) ×2 IMPLANT
COVER SURGICAL LIGHT HANDLE (MISCELLANEOUS) ×2 IMPLANT
COVER TIP SHEARS 8 DVNC (MISCELLANEOUS) ×1 IMPLANT
COVER TIP SHEARS 8MM DA VINCI (MISCELLANEOUS) ×1
COVER WAND RF STERILE (DRAPES) IMPLANT
CUTTER ECHEON FLEX ENDO 45 340 (ENDOMECHANICALS) ×2 IMPLANT
DECANTER SPIKE VIAL GLASS SM (MISCELLANEOUS) ×2 IMPLANT
DERMABOND ADVANCED (GAUZE/BANDAGES/DRESSINGS) ×1
DERMABOND ADVANCED .7 DNX12 (GAUZE/BANDAGES/DRESSINGS) ×1 IMPLANT
DRAIN CHANNEL 15F RND FF 3/16 (WOUND CARE) ×2 IMPLANT
DRAPE ARM DVNC X/XI (DISPOSABLE) ×4 IMPLANT
DRAPE COLUMN DVNC XI (DISPOSABLE) ×1 IMPLANT
DRAPE DA VINCI XI ARM (DISPOSABLE) ×4
DRAPE DA VINCI XI COLUMN (DISPOSABLE) ×1
DRAPE INCISE IOBAN 66X45 STRL (DRAPES) ×2 IMPLANT
DRAPE SHEET LG 3/4 BI-LAMINATE (DRAPES) ×2 IMPLANT
DRSG TEGADERM 4X4.75 (GAUZE/BANDAGES/DRESSINGS) ×2 IMPLANT
ELECT PENCIL ROCKER SW 15FT (MISCELLANEOUS) ×2 IMPLANT
ELECT REM PT RETURN 15FT ADLT (MISCELLANEOUS) ×4 IMPLANT
GAUZE 4X4 16PLY RFD (DISPOSABLE) ×2 IMPLANT
GAUZE SPONGE 2X2 8PLY STRL LF (GAUZE/BANDAGES/DRESSINGS) ×1 IMPLANT
GLOVE BIO SURGEON STRL SZ 6.5 (GLOVE) ×2 IMPLANT
GLOVE BIO SURGEON STRL SZ8 (GLOVE) ×4 IMPLANT
GLOVE BIOGEL PI IND STRL 8 (GLOVE) ×2 IMPLANT
GLOVE BIOGEL PI INDICATOR 8 (GLOVE) ×2
GOWN STRL REUS W/TWL LRG LVL3 (GOWN DISPOSABLE) ×6 IMPLANT
HEMOSTAT SURGICEL 4X8 (HEMOSTASIS) ×2 IMPLANT
HOLDER FOLEY CATH W/STRAP (MISCELLANEOUS) ×2 IMPLANT
IRRIG SUCT STRYKERFLOW 2 WTIP (MISCELLANEOUS) ×2
IRRIGATION SUCT STRKRFLW 2 WTP (MISCELLANEOUS) ×1 IMPLANT
IV LACTATED RINGERS 1000ML (IV SOLUTION) ×2 IMPLANT
KIT BASIN OR (CUSTOM PROCEDURE TRAY) ×2 IMPLANT
KIT TURNOVER KIT A (KITS) ×2 IMPLANT
LOOP VESSEL MAXI BLUE (MISCELLANEOUS) IMPLANT
NEEDLE INSUFFLATION 14GA 120MM (NEEDLE) ×2 IMPLANT
PAD POSITIONING PINK XL (MISCELLANEOUS) ×2 IMPLANT
PENCIL SMOKE EVACUATOR (MISCELLANEOUS) IMPLANT
PROTECTOR NERVE ULNAR (MISCELLANEOUS) ×6 IMPLANT
SEAL CANN UNIV 5-8 DVNC XI (MISCELLANEOUS) ×4 IMPLANT
SEAL XI 5MM-8MM UNIVERSAL (MISCELLANEOUS) ×4
SET TUBE SMOKE EVAC HIGH FLOW (TUBING) ×2 IMPLANT
SOLUTION ELECTROLUBE (MISCELLANEOUS) ×2 IMPLANT
SPONGE GAUZE 2X2 STER 10/PKG (GAUZE/BANDAGES/DRESSINGS) ×1
SPONGE LAP 4X18 RFD (DISPOSABLE) IMPLANT
STAPLE RELOAD 45 WHT (STAPLE) ×2 IMPLANT
STAPLE RELOAD 45MM WHITE (STAPLE) ×2
SUT ETHILON 3 0 PS 1 (SUTURE) ×2 IMPLANT
SUT MNCRL AB 4-0 PS2 18 (SUTURE) ×4 IMPLANT
SUT PDS AB 1 TP1 96 (SUTURE) ×2 IMPLANT
SUT VIC AB 2-0 SH 27 (SUTURE) ×1
SUT VIC AB 2-0 SH 27X BRD (SUTURE) ×1 IMPLANT
SUT VICRYL 0 UR6 27IN ABS (SUTURE) ×4 IMPLANT
TAPE CLOTH 4X10 WHT NS (GAUZE/BANDAGES/DRESSINGS) ×2 IMPLANT
TOWEL OR NON WOVEN STRL DISP B (DISPOSABLE) ×2 IMPLANT
TRAY FOLEY MTR SLVR 16FR STAT (SET/KITS/TRAYS/PACK) ×2 IMPLANT
TRAY LAPAROSCOPIC (CUSTOM PROCEDURE TRAY) ×2 IMPLANT
TROCAR BLADELESS OPT 5 100 (ENDOMECHANICALS) IMPLANT
TROCAR XCEL 12X100 BLDLESS (ENDOMECHANICALS) ×2 IMPLANT
WATER STERILE IRR 1000ML POUR (IV SOLUTION) ×2 IMPLANT

## 2019-02-24 NOTE — Op Note (Addendum)
Preoperative diagnosis: Right chronically infected, nonfunction kidney  Postop diagnosis: Same  Procedure: 1.  right robot assisted laparoscopic simple nephrectomy 2.  Lysis of adhesions, moderate  Attending: Nicolette Bang  Assistant: Debbrah Alar, PA  Anesthesia: General  Estimated blood loss: 25 cc  Drains: 16 French Foley catheter JP drain  Specimens: right simple nephrectomy  Antibiotics: ancef  Findings: 1 artery and 1 vein. Moderate ascending colon adhesions to anterior abdominal wall and liver. The assistant was utilized for retraction, suction, and deploying vascular staples.   Indications: Patient is a 58 year old with a history of a nonfunctioning chronically infected right kidney  After discussing treatment options the patient and sister decided to proceed with right robot assisted laparoscopic simple nephrectomy.  Procedure in detail: Prior to procedure consent was obtained. Patient was brought to the operating room and briefing was done sure correct patient, correct procedure, correct site.  General anesthesia was in administered patient was placed in the left lateral decubitus position.  a 58 French catheter was placed. their abdomen and flank was then prepped and draped usual sterile fashion.  A Veress needle was used to obtain pneumoperitoneum.  Once pneumoperitoneum was reestablished to 15 mmHg we then placed a 12 mm camera port lateral to the umbilicus at the lateral edge of rectus.  We then proceeded to place 3 more robotic ports. We then placed an assistant port inferior to the camera in the midline. We then docked the robot.  We then started this dissection by removing a moderate amount of anterior abdominal wall adhesions and adhesions to the liver.  We then dissected along the white line of [place].  We then reflected the colon medially.  We then proceeded to kocherize the duodenum. We then identified the psoas muscle.  Once this was done we traced it down to the  iliac vessels and identified the ureter.  Once we identified the gonadal vein and ureter were then traced this to the renal hilum.  The renal vein and renal artery were skeletonized.  We did we identified one renal vein one renal artery.  Using the Ethicon power stapler within ligated the renal artery.  Once this was done we then used a second staple load to ligate the renal vein.  We then used a hem-o-lock clips to ligate the gonadal vein and the ureter.  Once this was done we then freed the kidney from its lateral and posterior attachments.  We then used a Endo Catch bag to remove the specimen.  Once the specimen was in the Endo Catch bag we then inspected the retroperitoneum and noted no residual bleeding.  We placed a JP drain through the right lower quardant port and over the renal hilum. We then removed our instruments, undocked the robot, and released the pneumoperitoneum.  We then made a right lower quardant incision to remove the specimen.  Once the specimen was removed we then closed the camera and assistant ports with 0 Vicryl in interrupted fashion.  We then closed the  Right lower quadrant incision with 0 PDS in a running fashion.  We then closed the overlying skin with 2-0 Vicryl in running fashion.  The skin was then closed with staples.  This then concluded the procedure which was well tolerated by the patient.  Complications: None  Condition: Stable, x-rayed, transferred to PACU.  Plan: Patient is to be admitted for inpatient stay. The foley catheter will be removed in the morning. They will be started on a clear liquid diet POD#1.

## 2019-02-24 NOTE — Anesthesia Postprocedure Evaluation (Signed)
Anesthesia Post Note  Patient: [name]  Procedure(s) Performed: XI ROBOTIC ASSISTED LAPAROSCOPIC NEPHRECTOMY (Right )     Patient location during evaluation: PACU Anesthesia Type: General Level of consciousness: sedated Pain management: pain level controlled Vital Signs Assessment: post-procedure vital signs reviewed and stable Respiratory status: spontaneous breathing and respiratory function stable Cardiovascular status: stable Postop Assessment: no apparent nausea or vomiting Anesthetic complications: no    Last Vitals:  Vitals:   02/24/19 1800 02/24/19 1815  BP: 124/74 121/80  Pulse: 89 82  Resp: 15 16  Temp:  (!) 36.4 C  SpO2: 98% 98%    Last Pain:  Vitals:   02/24/19 1815  TempSrc:   PainSc: [place]

## 2019-02-24 NOTE — Transfer of Care (Signed)
Immediate Anesthesia Transfer of Care Note  Patient: [name]  Procedure(s) Performed: XI ROBOTIC ASSISTED LAPAROSCOPIC NEPHRECTOMY (Right )  Patient Location: PACU  Anesthesia Type:General  Level of Consciousness: awake and patient cooperative  Airway & Oxygen Therapy: Patient Spontanous Breathing and Patient connected to face mask  Post-op Assessment: Report given to RN and Post -op Vital signs reviewed and stable  Post vital signs: Reviewed and stable  Last Vitals:  Vitals Value Taken Time  BP 124/80 02/24/19 1730  Temp    Pulse 82 02/24/19 1733  Resp    SpO2 100 % 02/24/19 1733  Vitals shown include unvalidated device data.  Last Pain:  Vitals:   02/24/19 1050  TempSrc: Oral         Complications: No apparent anesthesia complications

## 2019-02-24 NOTE — Anesthesia Procedure Notes (Signed)
Procedure Name: Intubation Date/Time: 02/24/2019 2:36 PM Performed by: Sharlette Dense, CRNA Patient Re-evaluated:Patient Re-evaluated prior to induction Oxygen Delivery Method: Circle system utilized Preoxygenation: Pre-oxygenation with 100% oxygen Induction Type: IV induction Ventilation: Mask ventilation without difficulty and Oral airway inserted - appropriate to patient size Laryngoscope Size: Miller and 3 Grade View: Grade I Tube type: Oral Tube size: 7.5 mm Number of attempts: 1 Airway Equipment and Method: Stylet Placement Confirmation: ETT inserted through vocal cords under direct vision,  positive ETCO2 and breath sounds checked- equal and bilateral Secured at: 21 cm Tube secured with: Tape Dental Injury: Teeth and Oropharynx as per pre-operative assessment

## 2019-02-24 NOTE — H&P (Signed)
Urology Admission H&P  Chief Complaint: right flank pain  History of Present Illness: [name] is a 58yo with a hx of nephrolithiasis who initialyl underwent ARight nephrostomy tube placement on 11/9 and was found to have purulent drainage from the right kidney. The kidney was found to be poorly functioning. No recent fevers  Past Medical History:  Diagnosis Date  . Cerebral palsy ([place])   . Complication of anesthesia    Nausated for 1 1/2 days after the procedure  . 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 PONV (postoperative nausea and vomiting)    Past Surgical History:  Procedure Laterality Date  . IR NEPHROSTOMY PLACEMENT RIGHT  01/03/2019  . TENDON RELEASE      Home Medications:  Current Facility-Administered Medications  Medication Dose Route Frequency Provider Last Rate Last Admin  . bupivacaine liposome (EXPAREL) 1.3 % injection 266 mg  20 mL Infiltration Once Cleon Gustin, MD      . cefTRIAXone (ROCEPHIN) 2 g in sodium chloride 0.9 % 100 mL IVPB  2 g Intravenous 30 min Pre-Op Jesi Jurgens, Candee Furbish, MD      . lactated ringers infusion   Intravenous Continuous Duane Boston, MD 50 mL/hr at 02/24/19 1107 New Bag at 02/24/19 1107  . scopolamine (TRANSDERM-SCOP) 1 MG/3DAYS 1.5 mg  1 patch Transdermal Q72H Duane Boston, MD   1.5 mg at 02/24/19 1107   Allergies: No Known Allergies  Family History  Problem Relation Age of Onset  . Diabetes Mother   . Heart failure Mother   . Hypertension Mother   . Diabetes Father   . Heart failure Father   . Hypertension Father   . Cancer Father    Social History:  reports that he has never smoked. He has never used smokeless tobacco. He reports that he does not drink alcohol or use drugs.  Review of Systems  Genitourinary: Positive for flank pain.  All other systems reviewed and are negative.   Physical Exam:  Vital signs in last 24 hours: Temp:  [97.5 F (36.4 C)] 97.5 F (36.4 C) (12/31  1050) Pulse Rate:  [88] 88 (12/31 1050) Resp:  [18] 18 (12/31 1050) BP: (101)/(87) 101/87 (12/31 1050) SpO2:  [98 %] 98 % (12/31 1050) Physical Exam  Constitutional: He appears well-developed and well-nourished.  HENT:  Head: Normocephalic and atraumatic.  Eyes: EOM are normal.  Neck: No thyromegaly present.  Cardiovascular: Normal rate and regular rhythm.  Respiratory: Effort normal. No respiratory distress.  GI: Soft. He exhibits no distension.  Musculoskeletal:        General: Deformity present. No edema.     Cervical back: Normal range of motion.  Neurological: He is alert.  Skin: Skin is warm and dry.    Laboratory Data:  Results for orders placed or performed during the hospital encounter of 02/24/19 (from the past 24 hour(s))  Type and screen [place]     Status: None   Collection Time: 02/24/19 11:00 AM  Result Value Ref Range   ABO/RH(D) O POS    Antibody Screen NEG    Sample Expiration      02/27/2019,2359 Performed at [facility], [place] [address], [place], [place] [address]   ABO/Rh     Status: None   Collection Time: 02/24/19 11:00 AM  Result Value Ref Range   ABO/RH(D)      O POS Performed at [facility] [place]  [address] [place], [place] [address]    Recent Results (from the past 240 hour(s))  SARS CORONAVIRUS 2 (TAT 6-24 HRS) Nasopharyngeal Nasopharyngeal Swab     Status: None   Collection Time: 02/21/19  7:01 AM   Specimen: Nasopharyngeal Swab  Result Value Ref Range Status   SARS Coronavirus 2 NEGATIVE NEGATIVE Final    Comment: (NOTE) SARS-CoV-2 target nucleic acids are NOT DETECTED. The SARS-CoV-2 RNA is generally detectable in upper and lower respiratory specimens during the acute phase of infection. Negative results do not preclude SARS-CoV-2 infection, do not rule out co-infections with other pathogens, and should not be used as the sole basis for treatment or other patient management  decisions. Negative results must be combined with clinical observations, patient history, and epidemiological information. The expected result is Negative. Fact Sheet for Patients: HairSlick.no Fact Sheet for Healthcare Providers: quierodirigir.com This test is not yet approved or cleared by the [place] FDA and  has been authorized for detection and/or diagnosis of SARS-CoV-2 by FDA under an Emergency Use Authorization (EUA). This EUA will remain  in effect (meaning this test can be used) for the duration of the COVID-19 declaration under Section 56 4(b)(1) of the Act, 21 U.S.C. section 360bbb-3(b)(1), unless the authorization is terminated or revoked sooner. Performed at [facility], [address], [place], [place] [address]    Creatinine: Recent Labs    02/22/19 1509  CREATININE 0.90   Baseline Creatinine: 0.9  Impression/Assessment:  58yo with non functioning chronically infected right kidney  Plan:  The risks/benefits/altrnatives to robotic right simple nephrectomy was explained to the patient and his sister and they understand and wish to proceed with surgery  [name] 02/24/2019, 1:06 PM

## 2019-02-25 ENCOUNTER — Other Ambulatory Visit: Payer: Self-pay

## 2019-02-25 ENCOUNTER — Inpatient Hospital Stay (HOSPITAL_COMMUNITY): Payer: Medicare Other

## 2019-02-25 LAB — CBC
HCT: 37.1 % — ABNORMAL LOW (ref 39.0–52.0)
Hemoglobin: 11.1 g/dL — ABNORMAL LOW (ref 13.0–17.0)
MCH: 27.9 pg (ref 26.0–34.0)
MCHC: 29.9 g/dL — ABNORMAL LOW (ref 30.0–36.0)
MCV: 93.2 fL (ref 80.0–100.0)
Platelets: 310 10*3/uL (ref 150–400)
RBC: 3.98 MIL/uL — ABNORMAL LOW (ref 4.22–5.81)
RDW: 14.2 % (ref 11.5–15.5)
WBC: 9.5 10*3/uL (ref 4.0–10.5)
nRBC: 0 % (ref 0.0–0.2)

## 2019-02-25 LAB — HEMOGLOBIN AND HEMATOCRIT, BLOOD
HCT: 39.5 % (ref 39.0–52.0)
Hemoglobin: 12.1 g/dL — ABNORMAL LOW (ref 13.0–17.0)

## 2019-02-25 LAB — BASIC METABOLIC PANEL
Anion gap: 8 (ref 5–15)
Anion gap: 10 (ref 5–15)
BUN: 15 mg/dL (ref 6–20)
BUN: 15 mg/dL (ref 6–20)
CO2: 26 mmol/L (ref 22–32)
CO2: 25 mmol/L (ref 22–32)
Calcium: 8.7 mg/dL — ABNORMAL LOW (ref 8.9–10.3)
Calcium: 8.6 mg/dL — ABNORMAL LOW (ref 8.9–10.3)
Chloride: 103 mmol/L (ref 98–111)
Chloride: 102 mmol/L (ref 98–111)
Creatinine, Ser: 0.81 mg/dL (ref 0.61–1.24)
Creatinine, Ser: 1.04 mg/dL (ref 0.61–1.24)
GFR calc Af Amer: >60 mL/min (ref >60)
GFR calc Af Amer: >60 mL/min (ref >60)
GFR calc non Af Amer: >60 mL/min (ref >60)
GFR calc non Af Amer: >60 mL/min (ref >60)
Glucose, Bld: 217 mg/dL — ABNORMAL HIGH (ref 70–99)
Glucose, Bld: 121 mg/dL — ABNORMAL HIGH (ref 70–99)
Potassium: 4.9 mmol/L (ref 3.5–5.1)
Potassium: 4 mmol/L (ref 3.5–5.1)
Sodium: 137 mmol/L (ref 135–145)
Sodium: 137 mmol/L (ref 135–145)

## 2019-02-25 MED ORDER — CHLORHEXIDINE GLUCONATE CLOTH 2 % EX PADS
6.0000 | MEDICATED_PAD | Freq: Every day | CUTANEOUS | Status: DC
  Administered 2019-02-25 – 2019-02-26 (×2): 6 via TOPICAL

## 2019-02-25 MED ORDER — ENOXAPARIN SODIUM 40 MG/0.4ML ~~LOC~~ SOLN
40.0000 mg | SUBCUTANEOUS | Status: DC
  Administered 2019-02-25 – 2019-03-02 (×6): 40 mg via SUBCUTANEOUS
  Filled 2019-02-25 (×6): qty 0.4

## 2019-02-25 MED ORDER — SODIUM CHLORIDE 0.9 % IV BOLUS
1000.0000 mL | Freq: Once | INTRAVENOUS | Status: AC
  Administered 2019-02-25: 1000 mL via INTRAVENOUS

## 2019-02-25 NOTE — Progress Notes (Signed)
RN assessed patient this morning and noticed that patient's abdomen was slightly distended. Patient had positive BS but had not passed any flatus. RN asked patient's caregiver, his sister, who was at the bedside if she felt like his abdomen looked different to her and she said "maybe a little bit but about the same as usual." RN informed patient's sister that we will keep an eye on his abdomen. Around 1215, RN went into patient's room to discontinue his foley catheter and to assess his drain dressing since it has been leaking and found that patient's abdomen was even more distended than before and very taut.  BS were also noted to be faint. Patient has yet to pass any flatus.  BP checked and it was 99/78. When patient was asked if his stomach hurt, he point his hand to his nose which is his way of saying yes. MD paged with new findings. Awaiting call from MD or new orders to be placed. Will continue to monitor.

## 2019-02-25 NOTE — Progress Notes (Signed)
Urology Inpatient Progress Report  Nonfunctioning kidney [N28.9]  Procedure(s): XI ROBOTIC ASSISTED LAPAROSCOPIC NEPHRECTOMY  1 Day Post-Op   Intv/Subj: No acute events overnight. Patient is without complaint.  No pain.  No flatus.    Active Problems:   Nonfunctioning kidney  Current Facility-Administered Medications  Medication Dose Route Frequency Provider Last Rate Last Admin  . acetaminophen (TYLENOL) tablet 650 mg  650 mg Oral Q4H PRN Dancy, Amanda, PA-C      . cefTRIAXone (ROCEPHIN) 1 g in sodium chloride 0.9 % 100 mL IVPB  1 g Intravenous Q24H Dancy, Amanda, PA-C      . Chlorhexidine Gluconate Cloth 2 % PADS 6 each  6 each Topical Daily McKenzie, Patrick L, MD      . dextrose 5 %-0.45 % sodium chloride infusion   Intravenous Continuous Debbrah Alar, PA-C 75 mL/hr at 02/25/19 0547 Rate Verify at 02/25/19 0547  . diphenhydrAMINE (BENADRYL) injection 12.5-25 mg  12.5-25 mg Intravenous Q6H PRN Debbrah Alar, PA-C       Or  . diphenhydrAMINE (BENADRYL) 12.5 MG/5ML elixir 12.5-25 mg  12.5-25 mg Oral Q6H PRN Dancy, Amanda, PA-C      . docusate sodium (COLACE) capsule 100 mg  100 mg Oral BID Debbrah Alar, PA-C   100 mg at 02/24/19 2108  . HYDROmorphone (DILAUDID) injection 0.5-1 mg  0.5-1 mg Intravenous Q2H PRN Debbrah Alar, PA-C      . neomycin-bacitracin-polymyxin (NEOSPORIN) ointment packet 1 application  1 application Topical TID PRN Dancy, Amanda, PA-C      . ondansetron (ZOFRAN) injection 4 mg  4 mg Intravenous Q4H PRN Dancy, Amanda, PA-C      . opium-belladonna (B&O SUPPRETTES) 16.2-60 MG suppository 1 suppository  1 suppository Rectal Q6H PRN Dancy, Amanda, PA-C      . oxyCODONE (Oxy IR/ROXICODONE) immediate release tablet 5 mg  5 mg Oral Q4H PRN Debbrah Alar, PA-C   5 mg at 02/24/19 2354     Objective: Vital: Vitals:   02/24/19 1849 02/24/19 2208 02/25/19 0440 02/25/19 0444  BP: (!) 151/91 117/85 97/79   Pulse: 94 92 90   Resp:  18    Temp: (!) 97.4 F (36.3 C)  98 F (36.7 C)  98.3 F (36.8 C)  TempSrc: Oral Oral    SpO2:  98% 94%    I/Os: I/O last 3 completed shifts: In: 2692 [I.V.:2442; IV Piggyback:250] Out: 4259 [Urine:1200; Drains:10; Blood:25]  Physical Exam:  General: Patient is in no apparent distress Lungs: Normal respiratory effort, chest expands symmetrically. GI: Incisions are c/d/i. Soft, mildly distended, nontender. JP drain with serosanguinous drainage Foley: clear yellow urine Ext: lower extremities symmetric  Lab Results: Recent Labs    02/22/19 1509 02/24/19 1730 02/25/19 0501  WBC 10.5  --   --   HGB 13.0 11.7* 12.1*  HCT 41.7 38.7* 39.5   Recent Labs    02/22/19 1509 02/25/19 0501  NA 139 137  K 4.4 4.9  CL 105 103  CO2 24 26  GLUCOSE 90 217*  BUN 29* 15  CREATININE 0.90 0.81  CALCIUM 8.9 8.7*   No results for input(s): LABPT, INR in the last 72 hours. No results for input(s): LABURIN in the last 72 hours. Results for orders placed or performed during the hospital encounter of 02/21/19  SARS CORONAVIRUS 2 (TAT 6-24 HRS) Nasopharyngeal Nasopharyngeal Swab     Status: None   Collection Time: 02/21/19  7:01 AM   Specimen: Nasopharyngeal Swab  Result Value Ref Range Status  SARS Coronavirus 2 NEGATIVE NEGATIVE Final    Comment: (NOTE) SARS-CoV-2 target nucleic acids are NOT DETECTED. The SARS-CoV-2 RNA is generally detectable in upper and lower respiratory specimens during the acute phase of infection. Negative results do not preclude SARS-CoV-2 infection, do not rule out co-infections with other pathogens, and should not be used as the sole basis for treatment or other patient management decisions. Negative results must be combined with clinical observations, patient history, and epidemiological information. The expected result is Negative. Fact Sheet for Patients: HairSlick.no Fact Sheet for Healthcare Providers: quierodirigir.com This  test is not yet approved or cleared by the [place] FDA and  has been authorized for detection and/or diagnosis of SARS-CoV-2 by FDA under an Emergency Use Authorization (EUA). This EUA will remain  in effect (meaning this test can be used) for the duration of the COVID-19 declaration under Section 56 4(b)(1) of the Act, 21 U.S.C. section 360bbb-3(b)(1), unless the authorization is terminated or revoked sooner. Performed at [facility], [address], [place], [place] [address]     Studies/Results: No results found.  Assessment: Procedure(s): XI ROBOTIC ASSISTED LAPAROSCOPIC NEPHRECTOMY, 1 Day Post-Op  doing well.  Plan: -continue clear liquid diet until flatus -OOB to chair today -d/c foley, patient uses adult depends at baseline -d/c JP drain -pain meds PRN -bowel regimen   Kasandra Knudsen, MD Urology 02/25/2019, 8:30 AM

## 2019-02-25 NOTE — Progress Notes (Signed)
Called by nurse this evening about worsening abdominal distension and decreased BP.  Patient seen and examined at 9:45pm.  He is alert and in no apparent distress.  Abdomen is soft but distended and tympanic.  Non-tender when palpated.  Patient has voided and bladder scan 93 mL.  -KUB to assess post op ileus -back off clears to ice chips for comfort only -if abdominal distension worsens or pt begins to vomit will consider NGT placement -1L NS bolus due to low bp -check bmp and cbc

## 2019-02-25 NOTE — Progress Notes (Signed)
Assessed patient's abdomen which still remains distended and taut as earlier in the shift with no improvement.  Patient has not passed any gas at this point either.  Patient's sister stated that she noticed he was moaning a little more and asked for some pain medicine for him.  Patient was given prn Tylenol. While assessing his abdomen, RN noted his dressing over the JP site was saturated and a scant amount of draining in the JP bulb was present. Dressing was changed. Will inform oncoming RN of discussion with MD earlier regarding abdominal distention.  Will continue to monitor until shift change.

## 2019-02-25 NOTE — Progress Notes (Signed)
Upon assessment, pt's abdomen is taut and distended. Bowel sounds hypoactive in LQ and absent in RLQ. Pt became increasingly uncomfortable and gestured that he had pain in abd. Pt vitals checked and BP initially 64/40. Taken manually, 70/42. On call urologist paged and verbal orders received. Will follow up when results pend.

## 2019-02-25 NOTE — Progress Notes (Signed)
After second page, RN received a call back from rounding MD, Dr. Arita Miss, regarding patient's change in status. RN informed MD about patient's increased abdominal distention and that his abdomen was very taut.  RN informed MD that patient had active BS in all 4 quadrants this morning but currently has very faint bowel sounds and none noted in 3 quadrants. RN also informed MD that patient has had scant output from JP during night shift as well as during the day. Patient has had numerous dressing changes during both shifts due to leaking at the site and MD informed of this as well.  MD stated she wasn't concerned about the JP site at this time but informed RN to get the patient out of the bed and moving.  Patient unable to ambulate d/t history but did get patient OOB to chair x2 assist. Will continue to monitor.

## 2019-02-26 MED ORDER — ACETAMINOPHEN 500 MG PO TABS
1000.0000 mg | ORAL_TABLET | Freq: Three times a day (TID) | ORAL | Status: AC
  Administered 2019-02-26 (×2): 1000 mg via ORAL
  Administered 2019-02-26: 500 mg via ORAL
  Administered 2019-02-27 – 2019-02-28 (×3): 1000 mg via ORAL
  Filled 2019-02-26 (×6): qty 2

## 2019-02-26 MED ORDER — KETOROLAC TROMETHAMINE 15 MG/ML IJ SOLN
15.0000 mg | Freq: Three times a day (TID) | INTRAMUSCULAR | Status: DC
  Administered 2019-02-26 – 2019-03-02 (×8): 15 mg via INTRAVENOUS
  Filled 2019-02-26 (×10): qty 1

## 2019-02-26 MED ORDER — SENNOSIDES-DOCUSATE SODIUM 8.6-50 MG PO TABS
1.0000 | ORAL_TABLET | Freq: Two times a day (BID) | ORAL | Status: DC
  Administered 2019-02-26 – 2019-03-01 (×6): 1 via ORAL
  Filled 2019-02-26 (×9): qty 1

## 2019-02-26 MED ORDER — BISACODYL 10 MG RE SUPP
10.0000 mg | Freq: Once | RECTAL | Status: AC
  Administered 2019-02-26: 10 mg via RECTAL
  Filled 2019-02-26: qty 1

## 2019-02-26 NOTE — Progress Notes (Signed)
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 Pre-op UCX pan-sensitive proteus, on rocephin peri-op. Had neph tueb pre-op.  2 - Return of Bowel Function - pt with baseline neurogenic bowel. Had adhesiolysis as part of surgeyr 12/31. ON clears initially and avoidance of narcotcis. Sometimes requries aggressive bowel regimen at home.   Today "[name]" is stable. Tollerating clears. JP outptu remains scant. Hungry but abd distended.    Objective: Vital signs in last 24 hours: Temp:  [97.3 F (36.3 C)-98.4 F (36.9 C)] 98.1 F (36.7 C) (01/02 0652) Pulse Rate:  [70-86] 86 (01/02 0652) Resp:  [14-18] 18 (01/02 0652) BP: (62-110)/(34-86) 105/68 (01/02 0652) SpO2:  [91 %-97 %] 91 % (01/02 0652) Last BM Date: (pta)  Intake/Output from previous day: 01/01 0701 - 01/02 0700 In: 1769.5 [I.V.:1669.5; IV Piggyback:100] Out: 937 [Urine:620; Drains:25] Intake/Output this shift: No intake/output data recorded.  General appearance: alert and family at bedside. Pt at baseline per family. Pt smiles and nods with simple questions. No distress.  Eyes: negative Nose: Nares normal. Septum midline. Mucosa normal. No drainage or sinus tenderness. Throat: lips, mucosa, and tongue normal; teeth and gums normal Neck: supple, symmetrical, trachea midline Back: symmetric, no curvature. ROM normal. No CVA tenderness. Resp: Non-labored on minimal [place] O2 Cardio: regular rythem.  GI: distended and diffuse gas by percussion. No r/g. RLQ Jp with scant non-foul serrous output.  Male genitalia: normal, urine collection device (externalized) in place.  Extremities: Some mild contractures and muscle atrophy c/w CP.  Pulses: 2+ and symmetric Neurologic: Mental status: non-verbal.   Lab Results:  Recent Labs    02/25/19 0501 02/25/19 2136  WBC  --  9.5  HGB 12.1* 11.1*  HCT 39.5  37.1*  PLT  --  310   BMET Recent Labs    02/25/19 0501 02/25/19 2136  NA 137 137  K 4.9 4.0  CL 103 102  CO2 26 25  GLUCOSE 217* 121*  BUN 15 15  CREATININE 0.81 1.04  CALCIUM 8.7* 8.6*   PT/INR No results for input(s): LABPROT, INR in the last 72 hours. ABG No results for input(s): PHART, HCO3 in the last 72 hours.  Invalid input(s): PCO2, PO2  Studies/Results: DG Abd 1 View  Result Date: 02/25/2019 CLINICAL DATA:  Abdominal distension. EXAM: ABDOMEN - 1 VIEW COMPARISON:  CT dated 12/02/2018 FINDINGS: There is gaseous distention of loops of small bowel and colon scattered throughout the abdomen. There is a drainage catheter projecting over the patient's right lower quadrant and midline abdomen. There is a large amount of stool at the level of the rectum. IMPRESSION: 1. There are gaseous distended loops of small bowel and colon scattered throughout the abdomen. 2. Large amount of stool at the level of the rectum suggesting fecal impaction. 3. A drainage catheter projects over the patient's low abdomen. Correlation with history is recommended. Electronically Signed   By: Constance Holster M.D.   On: 02/25/2019 21:38    Anti-infectives: Anti-infectives (From admission, onward)   Start     Dose/Rate Route Frequency Ordered Stop   02/25/19 1200  cefTRIAXone (ROCEPHIN) 1 g in sodium chloride 0.9 % 100 mL IVPB     1 g 200 mL/hr over 30 Minutes Intravenous Every 24 hours 02/24/19 2000     02/24/19 1035  cefTRIAXone (ROCEPHIN) 2 g in sodium chloride 0.9 % 100 mL IVPB  2 g 200 mL/hr over 30 Minutes Intravenous 30 min pre-op 02/24/19 1035 02/24/19 1454      Assessment/Plan:  1 - Atrophic / Infected Right Kidney - doing well POD 2.   2 - Return of Bowel Function - minimize narcotics. Begin scheduled tylenol. Remain clears until funciton. Dulcolax SPP today.   DC JP and DC home once return of bowel funciton, possibly tomorrow.   Sebastian Ache 02/26/2019

## 2019-02-26 NOTE — Progress Notes (Signed)
Pt given prn pain medication for comfort. Urine output at 150cc since bolus given at 2130. Bladder scan showed 115cc of urine. Pt's JP drain w/ 25cc out this shift and saturated dressing changed x2. Bowel sounds audible but hypoactive. Pt more comfortable at this time w/ no complains of nausea. Will monitor.

## 2019-02-27 MED ORDER — PROMETHAZINE HCL 25 MG/ML IJ SOLN
12.5000 mg | Freq: Four times a day (QID) | INTRAMUSCULAR | Status: DC | PRN
  Administered 2019-02-27 – 2019-02-28 (×2): 12.5 mg via INTRAVENOUS
  Filled 2019-02-27 (×2): qty 1

## 2019-02-27 MED ORDER — LACTULOSE 10 GM/15ML PO SOLN
10.0000 g | Freq: Two times a day (BID) | ORAL | Status: DC | PRN
  Administered 2019-02-27: 10 g via ORAL
  Filled 2019-02-27: qty 15

## 2019-02-27 MED ORDER — FLEET ENEMA 7-19 GM/118ML RE ENEM
1.0000 | ENEMA | Freq: Once | RECTAL | Status: AC
  Administered 2019-02-28: 1 via RECTAL
  Filled 2019-02-27: qty 1

## 2019-02-27 NOTE — Progress Notes (Signed)
3 Days Post-Op  Subjective: 1 - Atrophic / Infected Right Kidney - s/p RIGHT robotic nephrectomy and adhesiolysis 12/31 by atrophic chronically infected kidney. Pre-op UCX pan-sensitive proteus, on rocephin peri-op. Had neph tueb pre-op.  Continues to have significant drain output with over the last 24hr and about 36ml in the bulb currently.     2 - Return of Bowel Function - pt with baseline neurogenic bowel. Had adhesiolysis as part of surgeyr 12/31. ON clears initially and avoidance of narcotcis. Sometimes requries aggressive bowel regimen at home.  He has been passing gas but only had a minimal response to a dulcolax suppository.  He is on clears but did have some nausea yesterday and is distended today.  Sister requests lactulose.   ROS:  Review of Systems  Unable to perform ROS: Patient nonverbal    Anti-infectives: Anti-infectives (From admission, onward)   Start     Dose/Rate Route Frequency Ordered Stop   02/25/19 1200  cefTRIAXone (ROCEPHIN) 1 g in sodium chloride 0.9 % 100 mL IVPB     1 g 200 mL/hr over 30 Minutes Intravenous Every 24 hours 02/24/19 2000     02/24/19 1035  cefTRIAXone (ROCEPHIN) 2 g in sodium chloride 0.9 % 100 mL IVPB     2 g 200 mL/hr over 30 Minutes Intravenous 30 min pre-op 02/24/19 1035 02/24/19 1454      Current Facility-Administered Medications  Medication Dose Route Frequency Provider Last Rate Last Admin  . acetaminophen (TYLENOL) tablet 1,000 mg  1,000 mg Oral Q8H Sebastian Ache, MD   1,000 mg at 02/27/19 0506  . cefTRIAXone (ROCEPHIN) 1 g in sodium chloride 0.9 % 100 mL IVPB  1 g Intravenous Q24H Dancy, Amanda, PA-C 200 mL/hr at 02/26/19 1513 1 g at 02/26/19 1513  . Chlorhexidine Gluconate Cloth 2 % PADS 6 each  6 each Topical Daily McKenzie, Mardene Celeste, MD   6 each at 02/26/19 1047  . diphenhydrAMINE (BENADRYL) injection 12.5-25 mg  12.5-25 mg Intravenous Q6H PRN Dancy, Amanda, PA-C       Or  . diphenhydrAMINE (BENADRYL) 12.5 MG/5ML  elixir 12.5-25 mg  12.5-25 mg Oral Q6H PRN Dancy, Amanda, PA-C      . enoxaparin (LOVENOX) injection 40 mg  40 mg Subcutaneous Q24H Kasandra Knudsen D, MD   40 mg at 02/26/19 1511  . HYDROmorphone (DILAUDID) injection 0.5-1 mg  0.5-1 mg Intravenous Q2H PRN Dancy, Amanda, PA-C      . ketorolac (TORADOL) 15 MG/ML injection 15 mg  15 mg Intravenous Q8H Sebastian Ache, MD   15 mg at 02/27/19 0313  . neomycin-bacitracin-polymyxin (NEOSPORIN) ointment packet 1 application  1 application Topical TID PRN Harrie Foreman, PA-C      . ondansetron (ZOFRAN) injection 4 mg  4 mg Intravenous Q4H PRN Harrie Foreman, PA-C   4 mg at 02/26/19 1839  . opium-belladonna (B&O SUPPRETTES) 16.2-60 MG suppository 1 suppository  1 suppository Rectal Q6H PRN Harrie Foreman, PA-C      . senna-docusate (Senokot-S) tablet 1 tablet  1 tablet Oral BID Sebastian Ache, MD   1 tablet at 02/26/19 2146     Objective: Vital signs in last 24 hours: Temp:  [98.2 F (36.8 C)-99.3 F (37.4 C)] 98.3 F (36.8 C) (01/03 0500) Pulse Rate:  [85-93] 93 (01/03 0500) Resp:  [16-20] 18 (01/03 0500) BP: (85-120)/(62-89) 99/79 (01/03 0500) SpO2:  [85 %-90 %] 85 % (01/03 0500)  Intake/Output from previous day: 01/02 0701 - 01/03 0700 In: 280 [P.O.:180;  IV Piggyback:100] Out: 585 [Urine:375; Drains:210] Intake/Output this shift: No intake/output data recorded.   Physical Exam Vitals reviewed.  Constitutional:      Comments: Non-verbal with spastic quadriparesis  Cardiovascular:     Rate and Rhythm: Normal rate and regular rhythm.  Pulmonary:     Effort: Pulmonary effort is normal.     Breath sounds: Normal breath sounds.  Abdominal:     General: There is distension.     Comments: Non-tender.  BS quiet.  Wounds intact.  JP with moderate serosanguinous drainage.   Musculoskeletal:        General: No swelling or tenderness.  Skin:    General: Skin is warm and dry.  Neurological:     Mental Status: He is alert.     Lab  Results:  Recent Labs    02/25/19 0501 02/25/19 2136  WBC  --  9.5  HGB 12.1* 11.1*  HCT 39.5 37.1*  PLT  --  310   BMET Recent Labs    02/25/19 0501 02/25/19 2136  NA 137 137  K 4.9 4.0  CL 103 102  CO2 26 25  GLUCOSE 217* 121*  BUN 15 15  CREATININE 0.81 1.04  CALCIUM 8.7* 8.6*   PT/INR No results for input(s): LABPROT, INR in the last 72 hours. ABG No results for input(s): PHART, HCO3 in the last 72 hours.  Invalid input(s): PCO2, PO2  Studies/Results: DG Abd 1 View  Result Date: 02/25/2019 CLINICAL DATA:  Abdominal distension. EXAM: ABDOMEN - 1 VIEW COMPARISON:  CT dated 12/02/2018 FINDINGS: There is gaseous distention of loops of small bowel and colon scattered throughout the abdomen. There is a drainage catheter projecting over the patient's right lower quadrant and midline abdomen. There is a large amount of stool at the level of the rectum. IMPRESSION: 1. There are gaseous distended loops of small bowel and colon scattered throughout the abdomen. 2. Large amount of stool at the level of the rectum suggesting fecal impaction. 3. A drainage catheter projects over the patient's low abdomen. Correlation with history is recommended. Electronically Signed   By: Constance Holster M.D.   On: 02/25/2019 21:38     Assessment and Plan: POD# from robotic right nephrectomy.   He continues that abdominal distention and his family reports lactulose has been helpful in the past.  I will place that order.   Continue clears.  If he doesn't have results from the lactulose, we could consider an enema.   Moderate JP drainage.  Will not D/C today.      LOS: 3 days    [name] 02/27/2019 [phone] ID: [name], male   DOB: 11-Sep-1960, 59 y.o.   MRN: [id number]

## 2019-02-28 MED ORDER — PROCHLORPERAZINE EDISYLATE 10 MG/2ML IJ SOLN
10.0000 mg | INTRAMUSCULAR | Status: DC | PRN
  Administered 2019-02-28: 10 mg via INTRAVENOUS
  Filled 2019-02-28: qty 2

## 2019-02-28 MED ORDER — BISACODYL 10 MG RE SUPP
10.0000 mg | Freq: Two times a day (BID) | RECTAL | Status: DC
  Administered 2019-02-28 – 2019-03-01 (×3): 10 mg via RECTAL
  Filled 2019-02-28 (×5): qty 1

## 2019-02-28 NOTE — Progress Notes (Signed)
Pt vomited once more. This episode there were 3 small, green objects, resembling mucous plugs, on the towel. PRN zofran given. Will continue to monitor.

## 2019-02-28 NOTE — Progress Notes (Signed)
Pt vomited x2. PRN zofran given after the first episode with no relief. Annabell Howells, MD notified and PRN phenergan q6h ordered, as well as a fleet enema in the AM. PRN phenergan given with relief. Pt is resting comfortably in bed with sister at bedside.

## 2019-02-28 NOTE — Care Management Important Message (Signed)
Important Message  Patient Details IM Letter given to Sharol Roussel RN Case Manager to present to the Patient Name: [name] MRN: [id number] Date of Birth: 03/03/1960   Medicare Important Message Given:  Yes     Caren Macadam 02/28/2019, 1:25 PM

## 2019-03-01 LAB — SURGICAL PATHOLOGY

## 2019-03-01 NOTE — Progress Notes (Signed)
5 Days Post-Op Subjective: Patient having flatus and multiple BMs. Nausea decreased.   Objective: Vital signs in last 24 hours: Temp:  [98 F (36.7 C)-99.4 F (37.4 C)] 98 F (36.7 C) (01/05 1250) Pulse Rate:  [89-97] 91 (01/05 1250) Resp:  [16-20] 16 (01/05 1250) BP: (93-141)/(62-96) 141/96 (01/05 1250) SpO2:  [93 %-98 %] 95 % (01/05 1250) Weight:  [58.1 kg] 58.1 kg (01/05 1311)  Intake/Output from previous day: 01/04 0701 - 01/05 0700 In: 150 [P.O.:50; IV Piggyback:100] Out: 770 [Urine:750; Drains:20] Intake/Output this shift: Total I/O In: 340 [P.O.:240; IV Piggyback:100] Out: -   Physical Exam:  General:alert, cooperative and appears stated age GI: soft, non tender, normal bowel sounds, no palpable masses, no organomegaly, no inguinal hernia Male genitalia: not done Extremities: extremities normal, atraumatic, no cyanosis or edema  Lab Results: No results for input(s): HGB, HCT in the last 72 hours. BMET No results for input(s): NA, K, CL, CO2, GLUCOSE, BUN, CREATININE, CALCIUM in the last 72 hours. No results for input(s): LABPT, INR in the last 72 hours. No results for input(s): LABURIN in the last 72 hours. Results for orders placed or performed during the hospital encounter of 02/21/19  SARS CORONAVIRUS 2 (TAT 6-24 HRS) Nasopharyngeal Nasopharyngeal Swab     Status: None   Collection Time: 02/21/19  7:01 AM   Specimen: Nasopharyngeal Swab  Result Value Ref Range Status   SARS Coronavirus 2 NEGATIVE NEGATIVE Final    Comment: (NOTE) SARS-CoV-2 target nucleic acids are NOT DETECTED. The SARS-CoV-2 RNA is generally detectable in upper and lower respiratory specimens during the acute phase of infection. Negative results do not preclude SARS-CoV-2 infection, do not rule out co-infections with other pathogens, and should not be used as the sole basis for treatment or other patient management decisions. Negative results must be combined with clinical  observations, patient history, and epidemiological information. The expected result is Negative. Fact Sheet for Patients: HairSlick.no Fact Sheet for Healthcare Providers: quierodirigir.com This test is not yet approved or cleared by the [place] FDA and  has been authorized for detection and/or diagnosis of SARS-CoV-2 by FDA under an Emergency Use Authorization (EUA). This EUA will remain  in effect (meaning this test can be used) for the duration of the COVID-19 declaration under Section 56 4(b)(1) of the Act, 21 U.S.C. section 360bbb-3(b)(1), unless the authorization is terminated or revoked sooner. Performed at [facility], [address], [place], [place] [address]     Studies/Results: No results found.  Assessment/Plan: POD#5 right simple nephrectomy  1. Continue dulcolax BID 2 advance diet to full liquids 3. Likely discharge tomorrow   LOS: 5 days   [name] 03/01/2019, 2:51 PM

## 2019-03-01 NOTE — Progress Notes (Signed)
Per sister, pt began "gagging" and had "very small" amount of brown emesis after drink soup and tea for lunch. IV Zofran given per PRN orders.

## 2019-03-01 NOTE — Plan of Care (Signed)

## 2019-03-02 ENCOUNTER — Ambulatory Visit: Payer: Medicare Other | Admitting: Urology

## 2019-03-02 MED ORDER — OXYCODONE-ACETAMINOPHEN 5-325 MG PO TABS: 1.0000 | ORAL_TABLET | ORAL | 0 refills | Status: AC | PRN

## 2019-03-02 MED ORDER — SODIUM CHLORIDE 0.9 % IV SOLN: INTRAVENOUS | Status: DC

## 2019-03-02 NOTE — Discharge Instructions (Signed)

## 2019-03-02 NOTE — Progress Notes (Signed)
Pt discharged home today per Dr. Ronne Binning. Pt's IV site D/C'd and WDL. JP drain d/c'd and site WDL. Sister educated on how to care for site at home. Pt's VSS. Pt sister provided with home medication list, discharge instructions and prescriptions. Verbalized understanding. Pt left floor via WC in stable condition accompanied by NT/RN/Sister.

## 2019-03-09 ENCOUNTER — Other Ambulatory Visit: Payer: Self-pay

## 2019-03-09 ENCOUNTER — Ambulatory Visit (INDEPENDENT_AMBULATORY_CARE_PROVIDER_SITE_OTHER): Payer: Medicare Other | Admitting: Urology

## 2019-03-09 ENCOUNTER — Encounter: Payer: Self-pay | Admitting: Urology

## 2019-03-09 VITALS — BP 96/64 | HR 84 | Temp 96.8°F | Ht 67.0 in | Wt 126.0 lb

## 2019-03-09 DIAGNOSIS — N200 Calculus of kidney: Secondary | ICD-10-CM

## 2019-03-09 NOTE — Progress Notes (Signed)
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 No pain. Normal bowel movements. Good appetitis   PMH: Past Medical History:  Diagnosis Date  . Cerebral palsy (HCC)   . Complication of anesthesia    Nausated for 1 1/2 days after the procedure  . 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 PONV (postoperative nausea and vomiting)     Surgical History: Past Surgical History:  Procedure Laterality Date  . IR NEPHROSTOMY PLACEMENT RIGHT  01/03/2019  . ROBOT ASSISTED LAPAROSCOPIC NEPHRECTOMY Right 02/24/2019   Procedure: XI ROBOTIC ASSISTED LAPAROSCOPIC NEPHRECTOMY;  Surgeon: Malen Gauze, MD;  Location: [facility];  Service: Urology;  Laterality: Right;  3 HRS  . TENDON RELEASE      Home Medications:  Allergies as of 03/09/2019   No Known Allergies     Medication List       Accurate as of March 09, 2019  3:13 PM. If you have any questions, ask your nurse or doctor.        acetaminophen 325 MG tablet Commonly known as: TYLENOL Take 325 mg by mouth every 6 (six) hours as needed for moderate pain or headache.   liver oil-zinc oxide 40 % ointment Commonly known as: DESITIN Apply 1 application topically as needed for irritation.   oxyCODONE-acetaminophen 5-325 MG tablet Commonly known as: PERCOCET/ROXICET Take 1-2 tablets by mouth every 6 (six) hours as needed for moderate pain.   oxyCODONE-acetaminophen 5-325 MG tablet Commonly known as: Percocet Take 1 tablet by mouth every 4 (four) hours as needed for severe pain.   sulfamethoxazole-trimethoprim 800-160 MG tablet Commonly known as: BACTRIM DS Take 1 tablet by mouth 2 (two) times daily.       Allergies: No Known Allergies  Family History: Family History  Problem Relation Age of Onset    . Diabetes Mother   . Heart failure Mother   . Hypertension Mother   . Diabetes Father   . Heart failure Father   . Hypertension Father   . Cancer Father     Social History:  reports that he has never smoked. He has never used smokeless tobacco. He reports that he does not drink alcohol or use drugs.  ROS:                                        Physical Exam: BP 96/64   Pulse 84   Temp (!) 96.8 F (36 C)   Ht 5\' 7"  (1.702 m)   Wt 126 lb (57.2 kg)   BMI 19.73 kg/m   Constitutional:  Alert and oriented, No acute distress. HEENT: [place] AT, moist mucus membranes.  Trachea midline, no masses. Cardiovascular: No clubbing, cyanosis, or edema. Respiratory: Normal respiratory effort, no increased work of breathing. GI: Abdomen is soft, nontender, nondistended, no abdominal masses GU: No CVA tenderness Lymph: No cervical or inguinal lymphadenopathy. Skin: No rashes, bruises or suspicious lesions. Neurologic: Grossly intact, no focal deficits, moving all 4 extremities. Psychiatric: Normal mood and affect.  Laboratory Data: Lab Results  Component Value Date   WBC 9.5 02/25/2019   HGB 11.1 (L) 02/25/2019   HCT 37.1 (L) 02/25/2019  MCV 93.2 02/25/2019   PLT 310 02/25/2019    Lab Results  Component Value Date   CREATININE 1.04 02/25/2019    No results found for: PSA  No results found for: TESTOSTERONE  No results found for: HGBA1C  Urinalysis    Component Value Date/Time   COLORURINE YELLOW 11/24/2014 1630   APPEARANCEUR CLEAR 11/24/2014 1630   LABSPEC 1.015 11/24/2014 1630   PHURINE 7.5 11/24/2014 1630   GLUCOSEU NEGATIVE 11/24/2014 1630   HGBUR LARGE (A) 11/24/2014 1630   BILIRUBINUR NEGATIVE 11/24/2014 1630   KETONESUR NEGATIVE 11/24/2014 1630   PROTEINUR 30 (A) 11/24/2014 1630   UROBILINOGEN 0.2 11/24/2014 1630   NITRITE POSITIVE (A) 11/24/2014 1630   LEUKOCYTESUR LARGE (A) 11/24/2014 1630    Lab Results  Component Value Date    BACTERIA FEW (A) 11/24/2014    Pertinent Imaging:  Results for orders placed during the hospital encounter of 02/24/19  DG Abd 1 View   Narrative CLINICAL DATA:  Abdominal distension.  EXAM: ABDOMEN - 1 VIEW  COMPARISON:  CT dated 12/02/2018  FINDINGS: There is gaseous distention of loops of small bowel and colon scattered throughout the abdomen. There is a drainage catheter projecting over the patient's right lower quadrant and midline abdomen. There is a large amount of stool at the level of the rectum.  IMPRESSION: 1. There are gaseous distended loops of small bowel and colon scattered throughout the abdomen. 2. Large amount of stool at the level of the rectum suggesting fecal impaction. 3. A drainage catheter projects over the patient's low abdomen. Correlation with history is recommended.   Electronically Signed   By: Constance Holster M.D.   On: 02/25/2019 21:38    No results found for this or any previous visit. No results found for this or any previous visit. No results found for this or any previous visit. No results found for this or any previous visit. No results found for this or any previous visit. No results found for this or any previous visit. No results found for this or any previous visit.  Assessment & Plan:    1. Right nonfunctioning kidney s/p right nephrectomy -RTC 3 months with renal US  No follow-ups on file.  Nicolette Bang, MD  [facility] Urology [place]

## 2019-03-09 NOTE — Patient Instructions (Signed)

## 2019-03-09 NOTE — Progress Notes (Signed)
Urological Symptom Review  Patient is experiencing the following symptoms: None  None Review of Systems  Gastrointestinal (upper)  : Negative for upper GI symptoms  Gastrointestinal (lower) : Negative for lower GI symptoms  Constitutional : Negative for symptoms  Skin: Negative for skin symptoms  Eyes: Negative for eye symptoms  Ear/Nose/Throat : Negative for Ear/Nose/Throat symptoms  Hematologic/Lymphatic: Negative for Hematologic/Lymphatic symptoms  Cardiovascular : Negative for cardiovascular symptoms  Respiratory : Negative for respiratory symptoms  Endocrine: Negative for endocrine symptoms  Musculoskeletal: Negative for musculoskeletal symptoms  Neurological: Negative for neurological symptoms  Psychologic: Negative for psychiatric symptoms

## 2019-03-14 NOTE — Discharge Summary (Signed)
Physician Discharge Summary  Patient ID: URIEL DOWDING MRN: [id number] DOB/AGE: 04-05-1960 59 y.o.  Admit date: 02/24/2019 Discharge date: 03/02/2019  Admission Diagnoses:  Nonfunctioning right kidney  Discharge Diagnoses:  Active Problems:   Nonfunctioning kidney   Past Medical History:  Diagnosis Date  . Cerebral palsy (HCC)   . Complication of anesthesia    Nausated for 1 1/2 days after the procedure  . 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 PONV (postoperative nausea and vomiting)     Surgeries: Procedure(s): XI ROBOTIC ASSISTED LAPAROSCOPIC NEPHRECTOMY on 02/24/2019   Consultants (if any):   Discharged Condition: Improved  Hospital Course: COLLAN SCHOENFELD is an 59 y.o. male who was admitted 02/24/2019 with a diagnosis of <principal problem not specified> and went to the operating room on 02/24/2019 and underwent the above named procedures.    He was given perioperative antibiotics:  Anti-infectives (From admission, onward)   Start     Dose/Rate Route Frequency Ordered Stop   02/25/19 1200  cefTRIAXone (ROCEPHIN) 1 g in sodium chloride 0.9 % 100 mL IVPB  Status:  Discontinued     1 g 200 mL/hr over 30 Minutes Intravenous Every 24 hours 02/24/19 2000 03/02/19 2147   02/24/19 1035  cefTRIAXone (ROCEPHIN) 2 g in sodium chloride 0.9 % 100 mL IVPB     2 g 200 mL/hr over 30 Minutes Intravenous 30 min pre-op 02/24/19 1035 02/24/19 1454    .  He was given sequential compression devices, early ambulation, for DVT prophylaxis.  He benefited maximally from the hospital stay and there were no complications.    Recent vital signs:  Vitals:   03/02/19 0524 03/02/19 1153  BP: 103/71 (!) 93/56  Pulse: 87 86  Resp: 18 16  Temp: 98.6 F (37 C) 98.1 F (36.7 C)  SpO2: 94% 94%    Recent laboratory studies:  Lab Results  Component Value Date   HGB 11.1 (L) 02/25/2019   HGB 12.1 (L) 02/25/2019   HGB 11.7 (L) 02/24/2019   Lab Results   Component Value Date   WBC 9.5 02/25/2019   PLT 310 02/25/2019   Lab Results  Component Value Date   INR 1.0 01/03/2019   Lab Results  Component Value Date   NA 137 02/25/2019   K 4.0 02/25/2019   CL 102 02/25/2019   CO2 25 02/25/2019   BUN 15 02/25/2019   CREATININE 1.04 02/25/2019   GLUCOSE 121 (H) 02/25/2019    Discharge Medications:   Allergies as of 03/02/2019   No Known Allergies     Medication List    TAKE these medications   acetaminophen 325 MG tablet Commonly known as: TYLENOL Take 325 mg by mouth every 6 (six) hours as needed for moderate pain or headache.   liver oil-zinc oxide 40 % ointment Commonly known as: DESITIN Apply 1 application topically as needed for irritation.   oxyCODONE-acetaminophen 5-325 MG tablet Commonly known as: PERCOCET/ROXICET Take 1-2 tablets by mouth every 6 (six) hours as needed for moderate pain. What changed: when to take this   oxyCODONE-acetaminophen 5-325 MG tablet Commonly known as: Percocet Take 1 tablet by mouth every 4 (four) hours as needed for severe pain. What changed: You were already taking a medication with the same name, and this prescription was added. Make sure you understand how and when to take each.   sulfamethoxazole-trimethoprim 800-160 MG tablet Commonly known as: BACTRIM DS Take 1 tablet by mouth  2 (two) times daily.       Diagnostic Studies: DG Abd 1 View  Result Date: 02/25/2019 CLINICAL DATA:  Abdominal distension. EXAM: ABDOMEN - 1 VIEW COMPARISON:  CT dated 12/02/2018 FINDINGS: There is gaseous distention of loops of small bowel and colon scattered throughout the abdomen. There is a drainage catheter projecting over the patient's right lower quadrant and midline abdomen. There is a large amount of stool at the level of the rectum. IMPRESSION: 1. There are gaseous distended loops of small bowel and colon scattered throughout the abdomen. 2. Large amount of stool at the level of the rectum  suggesting fecal impaction. 3. A drainage catheter projects over the patient's low abdomen. Correlation with history is recommended. Electronically Signed   By: Constance Holster M.D.   On: 02/25/2019 21:38    Disposition: Discharge disposition: 01-Home or Self Care       Discharge Instructions    Discharge patient   Complete by: As directed    Discharge disposition: 01-Home or Self Care   Discharge patient date: 03/02/2019      Follow-up Information    Cleon Gustin, MD On 03/02/2019.   Specialty: Urology Why: at 4:00 Contact information: [address] [place] [id number] [phone]            Signed: Nicolette Bang 03/14/2019, 9:18 AM

## 2019-05-20 ENCOUNTER — Ambulatory Visit: Payer: Medicare Other

## 2019-05-27 ENCOUNTER — Ambulatory Visit: Payer: Medicare Other | Attending: Internal Medicine

## 2019-05-27 DIAGNOSIS — Z23 Encounter for immunization: Secondary | ICD-10-CM

## 2019-05-27 NOTE — Progress Notes (Signed)
   Covid-19 Vaccination Clinic  Name:  [name]    MRN: [id number] DOB: [birth year]  05/27/2019  [name] was observed post Covid-19 immunization for 15 minutes without incident. He was provided with Vaccine Information Sheet and instruction to access the V-Safe system.   Mr. Every was instructed to call 911 with any severe reactions post vaccine: Marland Kitchen Difficulty breathing  . Swelling of face and throat  . A fast heartbeat  . A bad rash all over body  . Dizziness and weakness   Immunizations Administered    Name Date Dose VIS Date Route   Moderna COVID-19 Vaccine 05/27/2019 12:30 PM 0.5 mL 01/25/2019 Intramuscular   Manufacturer: Moderna   Lot: [id number]   NDC: 24825-003-70

## 2019-05-31 ENCOUNTER — Telehealth: Payer: Self-pay | Admitting: Urology

## 2019-05-31 ENCOUNTER — Other Ambulatory Visit: Payer: Self-pay

## 2019-05-31 DIAGNOSIS — N200 Calculus of kidney: Secondary | ICD-10-CM

## 2019-05-31 NOTE — Telephone Encounter (Signed)
Darl Pikes with scheduling called and she said the ultrasound order needs to be changed from limited to complete.

## 2019-05-31 NOTE — Telephone Encounter (Signed)
Just FYI- they do not do limited. I changed order complete.

## 2019-06-06 ENCOUNTER — Ambulatory Visit (HOSPITAL_COMMUNITY)
Admission: RE | Admit: 2019-06-06 | Discharge: 2019-06-06 | Disposition: A | Discharge status: Home / Self Care | Payer: Medicare Other | Source: Ambulatory Visit | Attending: Urology | Admitting: Urology

## 2019-06-06 ENCOUNTER — Other Ambulatory Visit: Payer: Self-pay

## 2019-06-06 ENCOUNTER — Encounter (HOSPITAL_COMMUNITY): Payer: Self-pay

## 2019-06-06 DIAGNOSIS — K5900 Constipation, unspecified: Secondary | ICD-10-CM | POA: Diagnosis not present

## 2019-06-06 DIAGNOSIS — N200 Calculus of kidney: Secondary | ICD-10-CM | POA: Diagnosis not present

## 2019-06-06 DIAGNOSIS — Z13220 Encounter for screening for lipoid disorders: Secondary | ICD-10-CM | POA: Diagnosis not present

## 2019-06-06 DIAGNOSIS — R739 Hyperglycemia, unspecified: Secondary | ICD-10-CM | POA: Diagnosis not present

## 2019-06-06 DIAGNOSIS — Z136 Encounter for screening for cardiovascular disorders: Secondary | ICD-10-CM | POA: Diagnosis not present

## 2019-06-06 DIAGNOSIS — Z0000 Encounter for general adult medical examination without abnormal findings: Secondary | ICD-10-CM | POA: Diagnosis not present

## 2019-06-08 ENCOUNTER — Other Ambulatory Visit: Payer: Self-pay

## 2019-06-08 ENCOUNTER — Encounter: Payer: Self-pay | Admitting: Urology

## 2019-06-08 ENCOUNTER — Ambulatory Visit (INDEPENDENT_AMBULATORY_CARE_PROVIDER_SITE_OTHER): Payer: Medicare Other | Admitting: Urology

## 2019-06-08 VITALS — BP 142/102 | HR 76 | Temp 97.2°F | Ht 65.5 in | Wt 125.0 lb

## 2019-06-08 DIAGNOSIS — N200 Calculus of kidney: Secondary | ICD-10-CM | POA: Diagnosis not present

## 2019-06-08 NOTE — Progress Notes (Signed)
06/08/2019 2:01 PM   Geanie Kenning 1961-01-29 [id number]  Referring provider: Aliene Beams, MD [id number] [place],  [place] [address]  nephrolithiasis  HPI: Mr Majano is a 59yo with a hx of XGP kidney and nephrolithiasis here for followup. No pain. No hematuria. Renal US from yesterday shows a 46mm left lower pole calculus.    PMH: Past Medical History:  Diagnosis Date  . Cerebral palsy (HCC)   . Complication of anesthesia    Nausated for 1 1/2 days after the procedure  . 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 PONV (postoperative nausea and vomiting)     Surgical History: Past Surgical History:  Procedure Laterality Date  . IR NEPHROSTOMY PLACEMENT RIGHT  01/03/2019  . ROBOT ASSISTED LAPAROSCOPIC NEPHRECTOMY Right 02/24/2019   Procedure: XI ROBOTIC ASSISTED LAPAROSCOPIC NEPHRECTOMY;  Surgeon: Malen Gauze, MD;  Location: [facility];  Service: Urology;  Laterality: Right;  3 HRS  . TENDON RELEASE      Home Medications:  Allergies as of 06/08/2019   No Known Allergies     Medication List       Accurate as of June 08, 2019  2:01 PM. If you have any questions, ask your nurse or doctor.        acetaminophen 325 MG tablet Commonly known as: TYLENOL Take 325 mg by mouth every 6 (six) hours as needed for moderate pain or headache.   liver oil-zinc oxide 40 % ointment Commonly known as: DESITIN Apply 1 application topically as needed for irritation.   oxyCODONE-acetaminophen 5-325 MG tablet Commonly known as: PERCOCET/ROXICET Take 1-2 tablets by mouth every 6 (six) hours as needed for moderate pain.   oxyCODONE-acetaminophen 5-325 MG tablet Commonly known as: Percocet Take 1 tablet by mouth every 4 (four) hours as needed for severe pain.   sulfamethoxazole-trimethoprim 800-160 MG tablet Commonly known as: BACTRIM DS Take 1 tablet by mouth 2 (two) times daily.       Allergies: No Known Allergies  Family  History: Family History  Problem Relation Age of Onset  . Diabetes Mother   . Heart failure Mother   . Hypertension Mother   . Diabetes Father   . Heart failure Father   . Hypertension Father   . Cancer Father     Social History:  reports that he has never smoked. He has never used smokeless tobacco. He reports that he does not drink alcohol or use drugs.  ROS: All other review of systems were reviewed and are negative except what is noted above in HPI  Physical Exam: BP (!) 142/102   Pulse 76   Temp (!) 97.2 F (36.2 C)   Ht 5' 5.5" (1.664 m)   Wt 125 lb (56.7 kg)   BMI 20.48 kg/m   Constitutional:  Alert and oriented, No acute distress. HEENT: [place] AT, moist mucus membranes.  Trachea midline, no masses. Cardiovascular: No clubbing, cyanosis, or edema. Respiratory: Normal respiratory effort, no increased work of breathing. GI: Abdomen is soft, nontender, nondistended, no abdominal masses GU: No CVA tenderness.  Lymph: No cervical or inguinal lymphadenopathy. Skin: No rashes, bruises or suspicious lesions. Neurologic: Grossly intact, no focal deficits, moving all 4 extremities. Psychiatric: Normal mood and affect.  Laboratory Data: Lab Results  Component Value Date   WBC 9.5 02/25/2019   HGB 11.1 (L) 02/25/2019   HCT 37.1 (L) 02/25/2019   MCV 93.2 02/25/2019   PLT 310 02/25/2019  Lab Results  Component Value Date   CREATININE 1.04 02/25/2019    No results found for: PSA  No results found for: TESTOSTERONE  No results found for: HGBA1C  Urinalysis    Component Value Date/Time   COLORURINE YELLOW 11/24/2014 1630   APPEARANCEUR CLEAR 11/24/2014 1630   LABSPEC 1.015 11/24/2014 1630   PHURINE 7.5 11/24/2014 1630   GLUCOSEU NEGATIVE 11/24/2014 1630   HGBUR LARGE (A) 11/24/2014 1630   BILIRUBINUR NEGATIVE 11/24/2014 1630   KETONESUR NEGATIVE 11/24/2014 1630   PROTEINUR 30 (A) 11/24/2014 1630   UROBILINOGEN 0.2 11/24/2014 1630   NITRITE POSITIVE (A)  11/24/2014 1630   LEUKOCYTESUR LARGE (A) 11/24/2014 1630    Lab Results  Component Value Date   BACTERIA FEW (A) 11/24/2014    Pertinent Imaging: Renal US: left 59mm lower pole calculus Results for orders placed during the hospital encounter of 02/24/19  DG Abd 1 View   Narrative CLINICAL DATA:  Abdominal distension.  EXAM: ABDOMEN - 1 VIEW  COMPARISON:  CT dated 12/02/2018  FINDINGS: There is gaseous distention of loops of small bowel and colon scattered throughout the abdomen. There is a drainage catheter projecting over the patient's right lower quadrant and midline abdomen. There is a large amount of stool at the level of the rectum.  IMPRESSION: 1. There are gaseous distended loops of small bowel and colon scattered throughout the abdomen. 2. Large amount of stool at the level of the rectum suggesting fecal impaction. 3. A drainage catheter projects over the patient's low abdomen. Correlation with history is recommended.   Electronically Signed   By: Constance Holster M.D.   On: 02/25/2019 21:38    No results found for this or any previous visit. No results found for this or any previous visit. No results found for this or any previous visit. Results for orders placed during the hospital encounter of 06/06/19  US RENAL   Narrative CLINICAL DATA:  Follow-up of renal calculi. Right nephrectomy in December.  EXAM: RENAL / URINARY TRACT ULTRASOUND COMPLETE  COMPARISON:  12/02/2018 CT  FINDINGS: Right Kidney:  Surgically absent  Left Kidney:  Renal measurements: 11.0 x 5.3 x 5.1 cm = volume: 159 mL. Normal echogenicity. Although no stones are seen on 12/02/2018 CT, suspicion of left renal calculi at up to 4 mm in the lower pole. A smaller interpolar calcification is also seen. No hydronephrosis.  Bladder:  Appears normal for degree of bladder distention.  Other:  None.  IMPRESSION: 1. Right nephrectomy. 2. Probable left renal calculi,  without hydronephrosis.   Electronically Signed   By: Abigail Miyamoto M.D.   On: 06/07/2019 08:24    No results found for this or any previous visit. No results found for this or any previous visit. No results found for this or any previous visit.  Assessment & Plan:    1. Nephrolithiasis -RTC 6 months with renal US   No follow-ups on file.  Nicolette Bang, MD  [facility] Urology [place]

## 2019-06-08 NOTE — Progress Notes (Signed)

## 2019-06-08 NOTE — Patient Instructions (Signed)

## 2019-06-29 ENCOUNTER — Ambulatory Visit: Payer: Medicare Other | Attending: Internal Medicine

## 2019-06-29 DIAGNOSIS — Z23 Encounter for immunization: Secondary | ICD-10-CM

## 2019-06-29 NOTE — Progress Notes (Signed)
   Covid-19 Vaccination Clinic  Name:  DRAKE WUERTZ    MRN: [id number] DOB: [birth year]  06/29/2019  Mr. Goodpasture was observed post Covid-19 immunization for 15 minutes without incident. He was provided with Vaccine Information Sheet and instruction to access the V-Safe system.   Mr. Sevin was instructed to call 911 with any severe reactions post vaccine: Marland Kitchen Difficulty breathing  . Swelling of face and throat  . A fast heartbeat  . A bad rash all over body  . Dizziness and weakness   Immunizations Administered    Name Date Dose VIS Date Route   Moderna COVID-19 Vaccine 06/29/2019 12:15 PM 0.5 mL 01/2019 Intramuscular   Manufacturer: Moderna   Lot: [id number]   NDC: 41146-431-42

## 2019-07-01 DIAGNOSIS — Z993 Dependence on wheelchair: Secondary | ICD-10-CM | POA: Diagnosis not present

## 2019-07-01 DIAGNOSIS — N200 Calculus of kidney: Secondary | ICD-10-CM | POA: Diagnosis not present

## 2019-07-01 DIAGNOSIS — N289 Disorder of kidney and ureter, unspecified: Secondary | ICD-10-CM | POA: Diagnosis not present

## 2019-07-01 DIAGNOSIS — Z9181 History of falling: Secondary | ICD-10-CM | POA: Diagnosis not present

## 2019-07-01 DIAGNOSIS — G809 Cerebral palsy, unspecified: Secondary | ICD-10-CM | POA: Diagnosis not present

## 2019-07-04 DIAGNOSIS — Z9181 History of falling: Secondary | ICD-10-CM | POA: Diagnosis not present

## 2019-07-04 DIAGNOSIS — N200 Calculus of kidney: Secondary | ICD-10-CM | POA: Diagnosis not present

## 2019-07-04 DIAGNOSIS — N289 Disorder of kidney and ureter, unspecified: Secondary | ICD-10-CM | POA: Diagnosis not present

## 2019-07-04 DIAGNOSIS — G809 Cerebral palsy, unspecified: Secondary | ICD-10-CM | POA: Diagnosis not present

## 2019-07-04 DIAGNOSIS — Z993 Dependence on wheelchair: Secondary | ICD-10-CM | POA: Diagnosis not present

## 2019-07-11 DIAGNOSIS — Z9181 History of falling: Secondary | ICD-10-CM | POA: Diagnosis not present

## 2019-07-11 DIAGNOSIS — N289 Disorder of kidney and ureter, unspecified: Secondary | ICD-10-CM | POA: Diagnosis not present

## 2019-07-11 DIAGNOSIS — G809 Cerebral palsy, unspecified: Secondary | ICD-10-CM | POA: Diagnosis not present

## 2019-07-11 DIAGNOSIS — N200 Calculus of kidney: Secondary | ICD-10-CM | POA: Diagnosis not present

## 2019-07-11 DIAGNOSIS — Z993 Dependence on wheelchair: Secondary | ICD-10-CM | POA: Diagnosis not present

## 2019-07-13 DIAGNOSIS — N289 Disorder of kidney and ureter, unspecified: Secondary | ICD-10-CM | POA: Diagnosis not present

## 2019-07-13 DIAGNOSIS — N200 Calculus of kidney: Secondary | ICD-10-CM | POA: Diagnosis not present

## 2019-07-13 DIAGNOSIS — Z9181 History of falling: Secondary | ICD-10-CM | POA: Diagnosis not present

## 2019-07-13 DIAGNOSIS — Z993 Dependence on wheelchair: Secondary | ICD-10-CM | POA: Diagnosis not present

## 2019-07-13 DIAGNOSIS — G809 Cerebral palsy, unspecified: Secondary | ICD-10-CM | POA: Diagnosis not present

## 2019-07-18 DIAGNOSIS — N200 Calculus of kidney: Secondary | ICD-10-CM | POA: Diagnosis not present

## 2019-07-18 DIAGNOSIS — Z9181 History of falling: Secondary | ICD-10-CM | POA: Diagnosis not present

## 2019-07-18 DIAGNOSIS — N289 Disorder of kidney and ureter, unspecified: Secondary | ICD-10-CM | POA: Diagnosis not present

## 2019-07-18 DIAGNOSIS — G809 Cerebral palsy, unspecified: Secondary | ICD-10-CM | POA: Diagnosis not present

## 2019-07-18 DIAGNOSIS — Z993 Dependence on wheelchair: Secondary | ICD-10-CM | POA: Diagnosis not present

## 2019-08-03 DIAGNOSIS — N289 Disorder of kidney and ureter, unspecified: Secondary | ICD-10-CM | POA: Diagnosis not present

## 2019-08-03 DIAGNOSIS — Z993 Dependence on wheelchair: Secondary | ICD-10-CM | POA: Diagnosis not present

## 2019-08-03 DIAGNOSIS — N200 Calculus of kidney: Secondary | ICD-10-CM | POA: Diagnosis not present

## 2019-08-03 DIAGNOSIS — G809 Cerebral palsy, unspecified: Secondary | ICD-10-CM | POA: Diagnosis not present

## 2019-08-03 DIAGNOSIS — Z9181 History of falling: Secondary | ICD-10-CM | POA: Diagnosis not present

## 2019-08-08 DIAGNOSIS — Z9181 History of falling: Secondary | ICD-10-CM | POA: Diagnosis not present

## 2019-08-08 DIAGNOSIS — G809 Cerebral palsy, unspecified: Secondary | ICD-10-CM | POA: Diagnosis not present

## 2019-08-08 DIAGNOSIS — N200 Calculus of kidney: Secondary | ICD-10-CM | POA: Diagnosis not present

## 2019-08-08 DIAGNOSIS — N289 Disorder of kidney and ureter, unspecified: Secondary | ICD-10-CM | POA: Diagnosis not present

## 2019-08-08 DIAGNOSIS — Z993 Dependence on wheelchair: Secondary | ICD-10-CM | POA: Diagnosis not present

## 2019-09-07 DIAGNOSIS — G809 Cerebral palsy, unspecified: Secondary | ICD-10-CM | POA: Diagnosis not present

## 2019-09-07 DIAGNOSIS — M6281 Muscle weakness (generalized): Secondary | ICD-10-CM | POA: Diagnosis not present

## 2019-10-08 DIAGNOSIS — M6281 Muscle weakness (generalized): Secondary | ICD-10-CM | POA: Diagnosis not present

## 2019-10-08 DIAGNOSIS — G809 Cerebral palsy, unspecified: Secondary | ICD-10-CM | POA: Diagnosis not present

## 2019-11-08 DIAGNOSIS — M6281 Muscle weakness (generalized): Secondary | ICD-10-CM | POA: Diagnosis not present

## 2019-11-08 DIAGNOSIS — G809 Cerebral palsy, unspecified: Secondary | ICD-10-CM | POA: Diagnosis not present

## 2019-11-15 ENCOUNTER — Ambulatory Visit (HOSPITAL_COMMUNITY)
Admission: RE | Admit: 2019-11-15 | Discharge: 2019-11-15 | Disposition: A | Discharge status: Home / Self Care | Payer: Medicare Other | Source: Ambulatory Visit | Attending: Urology | Admitting: Urology

## 2019-11-15 ENCOUNTER — Other Ambulatory Visit: Payer: Self-pay

## 2019-11-15 DIAGNOSIS — N200 Calculus of kidney: Secondary | ICD-10-CM

## 2019-11-15 DIAGNOSIS — Z87442 Personal history of urinary calculi: Secondary | ICD-10-CM | POA: Diagnosis not present

## 2019-11-29 NOTE — Progress Notes (Signed)
Results printed and mailed to pt

## 2019-12-07 ENCOUNTER — Telehealth: Payer: Self-pay | Admitting: Urology

## 2019-12-07 ENCOUNTER — Ambulatory Visit: Payer: Medicare Other | Admitting: Urology

## 2019-12-07 DIAGNOSIS — N200 Calculus of kidney: Secondary | ICD-10-CM

## 2019-12-07 NOTE — Telephone Encounter (Signed)
Patients sister called and asked if she needs to bring him today and prefers not to. Please advise.

## 2019-12-08 DIAGNOSIS — M6281 Muscle weakness (generalized): Secondary | ICD-10-CM | POA: Diagnosis not present

## 2019-12-08 DIAGNOSIS — G809 Cerebral palsy, unspecified: Secondary | ICD-10-CM | POA: Diagnosis not present

## 2019-12-08 NOTE — Telephone Encounter (Signed)
Can you review this patient u/s? The sister does not wish to bring him out at this time.

## 2019-12-13 NOTE — Telephone Encounter (Signed)
Left message for [name] to return our call.

## 2019-12-13 NOTE — Telephone Encounter (Signed)
It was normal

## 2019-12-20 NOTE — Telephone Encounter (Signed)
Called x2 . Message left. No answer.

## 2020-01-04 NOTE — Telephone Encounter (Signed)
Results of ultrasound mailed

## 2020-01-08 DIAGNOSIS — M6281 Muscle weakness (generalized): Secondary | ICD-10-CM | POA: Diagnosis not present

## 2020-01-08 DIAGNOSIS — G809 Cerebral palsy, unspecified: Secondary | ICD-10-CM | POA: Diagnosis not present

## 2020-02-07 DIAGNOSIS — G809 Cerebral palsy, unspecified: Secondary | ICD-10-CM | POA: Diagnosis not present

## 2020-02-07 DIAGNOSIS — M6281 Muscle weakness (generalized): Secondary | ICD-10-CM | POA: Diagnosis not present

## 2020-02-29 DIAGNOSIS — A084 Viral intestinal infection, unspecified: Secondary | ICD-10-CM | POA: Diagnosis not present

## 2020-03-09 DIAGNOSIS — M6281 Muscle weakness (generalized): Secondary | ICD-10-CM | POA: Diagnosis not present

## 2020-03-09 DIAGNOSIS — G809 Cerebral palsy, unspecified: Secondary | ICD-10-CM | POA: Diagnosis not present

## 2020-04-09 DIAGNOSIS — G809 Cerebral palsy, unspecified: Secondary | ICD-10-CM | POA: Diagnosis not present

## 2020-04-09 DIAGNOSIS — M6281 Muscle weakness (generalized): Secondary | ICD-10-CM | POA: Diagnosis not present

## 2020-05-07 DIAGNOSIS — M6281 Muscle weakness (generalized): Secondary | ICD-10-CM | POA: Diagnosis not present

## 2020-05-07 DIAGNOSIS — G809 Cerebral palsy, unspecified: Secondary | ICD-10-CM | POA: Diagnosis not present

## 2020-06-07 DIAGNOSIS — M6281 Muscle weakness (generalized): Secondary | ICD-10-CM | POA: Diagnosis not present

## 2020-06-07 DIAGNOSIS — G809 Cerebral palsy, unspecified: Secondary | ICD-10-CM | POA: Diagnosis not present

## 2020-06-14 DIAGNOSIS — N289 Disorder of kidney and ureter, unspecified: Secondary | ICD-10-CM | POA: Diagnosis not present

## 2020-06-14 DIAGNOSIS — N200 Calculus of kidney: Secondary | ICD-10-CM | POA: Diagnosis not present

## 2020-06-14 DIAGNOSIS — G809 Cerebral palsy, unspecified: Secondary | ICD-10-CM | POA: Diagnosis not present

## 2020-07-14 DIAGNOSIS — G809 Cerebral palsy, unspecified: Secondary | ICD-10-CM | POA: Diagnosis not present

## 2020-07-14 DIAGNOSIS — N289 Disorder of kidney and ureter, unspecified: Secondary | ICD-10-CM | POA: Diagnosis not present

## 2020-07-14 DIAGNOSIS — N200 Calculus of kidney: Secondary | ICD-10-CM | POA: Diagnosis not present

## 2020-07-18 DIAGNOSIS — R1013 Epigastric pain: Secondary | ICD-10-CM | POA: Diagnosis not present

## 2020-07-18 DIAGNOSIS — K5900 Constipation, unspecified: Secondary | ICD-10-CM | POA: Diagnosis not present

## 2020-08-14 DIAGNOSIS — N200 Calculus of kidney: Secondary | ICD-10-CM | POA: Diagnosis not present

## 2020-08-14 DIAGNOSIS — N289 Disorder of kidney and ureter, unspecified: Secondary | ICD-10-CM | POA: Diagnosis not present

## 2020-08-14 DIAGNOSIS — G809 Cerebral palsy, unspecified: Secondary | ICD-10-CM | POA: Diagnosis not present

## 2020-08-22 ENCOUNTER — Encounter (HOSPITAL_COMMUNITY): Payer: Self-pay

## 2020-08-22 ENCOUNTER — Inpatient Hospital Stay (HOSPITAL_COMMUNITY)
Admission: EM | Admit: 2020-08-22 | Discharge: 2020-08-26 | DRG: 377 | Disposition: A | Discharge status: Home / Self Care | Payer: Medicare Other | Attending: Internal Medicine | Admitting: Internal Medicine

## 2020-08-22 DIAGNOSIS — D509 Iron deficiency anemia, unspecified: Secondary | ICD-10-CM | POA: Diagnosis present

## 2020-08-22 DIAGNOSIS — G800 Spastic quadriplegic cerebral palsy: Secondary | ICD-10-CM

## 2020-08-22 DIAGNOSIS — Z993 Dependence on wheelchair: Secondary | ICD-10-CM

## 2020-08-22 DIAGNOSIS — H5789 Other specified disorders of eye and adnexa: Secondary | ICD-10-CM | POA: Diagnosis not present

## 2020-08-22 DIAGNOSIS — Z66 Do not resuscitate: Secondary | ICD-10-CM | POA: Diagnosis present

## 2020-08-22 DIAGNOSIS — F809 Developmental disorder of speech and language, unspecified: Secondary | ICD-10-CM | POA: Diagnosis not present

## 2020-08-22 DIAGNOSIS — D62 Acute posthemorrhagic anemia: Secondary | ICD-10-CM | POA: Diagnosis present

## 2020-08-22 DIAGNOSIS — K5900 Constipation, unspecified: Secondary | ICD-10-CM | POA: Diagnosis not present

## 2020-08-22 DIAGNOSIS — L03213 Periorbital cellulitis: Secondary | ICD-10-CM | POA: Diagnosis not present

## 2020-08-22 DIAGNOSIS — K921 Melena: Principal | ICD-10-CM | POA: Diagnosis present

## 2020-08-22 DIAGNOSIS — G809 Cerebral palsy, unspecified: Secondary | ICD-10-CM | POA: Diagnosis not present

## 2020-08-22 DIAGNOSIS — Z833 Family history of diabetes mellitus: Secondary | ICD-10-CM | POA: Diagnosis not present

## 2020-08-22 DIAGNOSIS — G808 Other cerebral palsy: Secondary | ICD-10-CM | POA: Diagnosis not present

## 2020-08-22 DIAGNOSIS — R634 Abnormal weight loss: Secondary | ICD-10-CM | POA: Diagnosis not present

## 2020-08-22 DIAGNOSIS — L89213 Pressure ulcer of right hip, stage 3: Secondary | ICD-10-CM

## 2020-08-22 DIAGNOSIS — Z79899 Other long term (current) drug therapy: Secondary | ICD-10-CM

## 2020-08-22 DIAGNOSIS — Z7401 Bed confinement status: Secondary | ICD-10-CM | POA: Diagnosis not present

## 2020-08-22 DIAGNOSIS — K922 Gastrointestinal hemorrhage, unspecified: Secondary | ICD-10-CM | POA: Diagnosis present

## 2020-08-22 DIAGNOSIS — Z87440 Personal history of urinary (tract) infections: Secondary | ICD-10-CM | POA: Diagnosis not present

## 2020-08-22 DIAGNOSIS — R195 Other fecal abnormalities: Secondary | ICD-10-CM | POA: Diagnosis not present

## 2020-08-22 DIAGNOSIS — D500 Iron deficiency anemia secondary to blood loss (chronic): Secondary | ICD-10-CM | POA: Diagnosis not present

## 2020-08-22 DIAGNOSIS — G839 Paralytic syndrome, unspecified: Secondary | ICD-10-CM | POA: Diagnosis not present

## 2020-08-22 DIAGNOSIS — Z681 Body mass index (BMI) 19 or less, adult: Secondary | ICD-10-CM

## 2020-08-22 DIAGNOSIS — Z905 Acquired absence of kidney: Secondary | ICD-10-CM | POA: Diagnosis not present

## 2020-08-22 DIAGNOSIS — Z20822 Contact with and (suspected) exposure to covid-19: Secondary | ICD-10-CM | POA: Diagnosis not present

## 2020-08-22 DIAGNOSIS — D649 Anemia, unspecified: Secondary | ICD-10-CM

## 2020-08-22 DIAGNOSIS — L0390 Cellulitis, unspecified: Secondary | ICD-10-CM

## 2020-08-22 DIAGNOSIS — M62838 Other muscle spasm: Secondary | ICD-10-CM | POA: Diagnosis not present

## 2020-08-22 DIAGNOSIS — K449 Diaphragmatic hernia without obstruction or gangrene: Secondary | ICD-10-CM | POA: Diagnosis not present

## 2020-08-22 DIAGNOSIS — L89212 Pressure ulcer of right hip, stage 2: Secondary | ICD-10-CM | POA: Diagnosis not present

## 2020-08-22 DIAGNOSIS — L8990 Pressure ulcer of unspecified site, unspecified stage: Secondary | ICD-10-CM

## 2020-08-22 DIAGNOSIS — Z8249 Family history of ischemic heart disease and other diseases of the circulatory system: Secondary | ICD-10-CM

## 2020-08-22 DIAGNOSIS — R630 Anorexia: Secondary | ICD-10-CM | POA: Diagnosis present

## 2020-08-22 DIAGNOSIS — T148XXA Other injury of unspecified body region, initial encounter: Secondary | ICD-10-CM | POA: Diagnosis not present

## 2020-08-22 LAB — COMPREHENSIVE METABOLIC PANEL
ALT: 23 U/L (ref 0–44)
AST: 19 U/L (ref 15–41)
Albumin: 3.3 g/dL — ABNORMAL LOW (ref 3.5–5.0)
Alkaline Phosphatase: 86 U/L (ref 38–126)
Anion gap: 6 (ref 5–15)
BUN: 21 mg/dL — ABNORMAL HIGH (ref 6–20)
CO2: 26 mmol/L (ref 22–32)
Calcium: 8.3 mg/dL — ABNORMAL LOW (ref 8.9–10.3)
Chloride: 105 mmol/L (ref 98–111)
Creatinine, Ser: 0.8 mg/dL (ref 0.61–1.24)
GFR, Estimated: >60 mL/min (ref >60)
Glucose, Bld: 97 mg/dL (ref 70–99)
Potassium: 4.1 mmol/L (ref 3.5–5.1)
Sodium: 137 mmol/L (ref 135–145)
Total Bilirubin: 0.3 mg/dL (ref 0.3–1.2)
Total Protein: 6.6 g/dL (ref 6.5–8.1)

## 2020-08-22 LAB — CBC
HCT: 26 % — ABNORMAL LOW (ref 39.0–52.0)
Hemoglobin: 7.2 g/dL — ABNORMAL LOW (ref 13.0–17.0)
MCH: 21.6 pg — ABNORMAL LOW (ref 26.0–34.0)
MCHC: 27.7 g/dL — ABNORMAL LOW (ref 30.0–36.0)
MCV: 78.1 fL — ABNORMAL LOW (ref 80.0–100.0)
Platelets: 612 10*3/uL — ABNORMAL HIGH (ref 150–400)
RBC: 3.33 MIL/uL — ABNORMAL LOW (ref 4.22–5.81)
RDW: 15.6 % — ABNORMAL HIGH (ref 11.5–15.5)
WBC: 8.1 10*3/uL (ref 4.0–10.5)
nRBC: 0 % (ref 0.0–0.2)

## 2020-08-22 LAB — POC OCCULT BLOOD, ED: Fecal Occult Bld: POSITIVE — AB

## 2020-08-22 LAB — PREPARE RBC (CROSSMATCH)

## 2020-08-22 MED ORDER — SODIUM CHLORIDE 0.9 % IV SOLN
10.0000 mL/h | Freq: Once | INTRAVENOUS | Status: AC
  Administered 2020-08-22: 10 mL/h via INTRAVENOUS

## 2020-08-22 MED ORDER — ACETAMINOPHEN 325 MG PO TABS: 325.0000 mg | ORAL_TABLET | Freq: Four times a day (QID) | ORAL | Status: DC | PRN

## 2020-08-22 MED ORDER — PANTOPRAZOLE SODIUM 40 MG IV SOLR
40.0000 mg | Freq: Once | INTRAVENOUS | Status: AC
  Administered 2020-08-22: 40 mg via INTRAVENOUS
  Filled 2020-08-22: qty 40

## 2020-08-22 MED ORDER — LORATADINE 10 MG PO TABS
10.0000 mg | ORAL_TABLET | Freq: Every day | ORAL | Status: DC
  Administered 2020-08-23 – 2020-08-25 (×3): 10 mg via ORAL
  Filled 2020-08-22 (×4): qty 1

## 2020-08-22 MED ORDER — CEPHALEXIN 250 MG PO CAPS
250.0000 mg | ORAL_CAPSULE | Freq: Four times a day (QID) | ORAL | Status: DC
  Administered 2020-08-23 (×2): 250 mg via ORAL
  Filled 2020-08-22 (×3): qty 1

## 2020-08-22 MED ORDER — PANTOPRAZOLE SODIUM 40 MG IV SOLR: 40.0000 mg | Freq: Every day | INTRAVENOUS | Status: DC

## 2020-08-22 NOTE — ED Provider Notes (Signed)
Emergency Medicine Provider Triage Evaluation Note  [name] , a 60 y.o. male  was evaluated in triage.  Pt complains of low hemoglobin.  History provided by caregiver at bedside.  He has been less energetic, more withdrawn over the last few days.  Labs today showed hemoglobin of 6.  Caregiver denies any blood in his stool, blood in his urine.  Not on blood thinners.  He did have CT a couple weeks ago which showed that he was constipated.  Review of Systems  Positive: As above Negative: As above  Physical Exam  BP 122/82   Pulse 78   Temp 98.9 F (37.2 C) (Oral)   Resp 18   SpO2 99%  Gen:   Awake, no distress   Resp:  Normal effort  MSK:   Moves extremities without difficulty  Other:    Medical Decision Making  Medically screening exam initiated at 6:05 PM.  Appropriate orders placed.  Geanie Kenning was informed that the remainder of the evaluation will be completed by another provider, this initial triage assessment does not replace that evaluation, and the importance of remaining in the ED until their evaluation is complete.     Leone Brand 08/22/20 1810    Cheryll Cockayne, MD 08/31/20 [phone]

## 2020-08-22 NOTE — ED Triage Notes (Signed)
Patient seen at PCP for C/o lethargy and muscle spasms.  C/o abdominal pain   Hemoglobin came back at 6.0.   Patient wheelchair bound.   Hx: constipation a couple weeks ago and had CT

## 2020-08-22 NOTE — H&P (Addendum)
History and Physical    [name]:[id number] DOB: September 11, 1960 DOA: 08/22/2020  PCP: Aliene Beams, MD  Patient coming from: Home  I have personally briefly reviewed patient's old medical records in [facility]  Chief Complaint: lethargy  HPI: [name] is a 60 y.o. male with medical history significant for cerebral palsy-nonverbal and wheelchair-bound,, pressure ulcer wound, history of right nephrectomy who presents with concerns of increasing lethargy.  Patient is nonverbal at baseline so sister at bedside provides history.  She reports that for the past months he has had on and off lethargy.  There will be days where he will sleep more than usual.  States normally he is very interactive and "bubbly."  Also had lower appetite. Normally he is able to drink out of a straw on his own but lately has needed more help. She presented to his PCP with these symptoms and he was found to have hemoglobin of 6 and asked to present to the ED. She denies seeing any bright red blood per rectum or dark stool.  He has never had a colonoscopy.  He recently was seen at [facility] ED for abdominal pain and had CT of the abdomen on 07/18/2020 showing constipation and chronic thickening of the lower esophagus.  He was discharged with magnesium citrate and sister is also increase his fiber intake and states his bowel movements have now been regular.  She is also noticed increased swelling of his left upper eyelid for the past 2 days.  Has also noticed some crusting of the eyelid and below his eyes.  No discharge of the eye.  States he has not been rubbing at it.  ED Course: He had intermittent hypotension down to systolic of 90s although doubt validity of his blood pressure checks as his cuff is on his risk and patient frequently moves his extremities.  CBC shows no leukocytosis.  Hemoglobin of 7.2 which is down from 11 a year ago.  Platelet of 612.  Positive fecal occult blood and dark stool was  noted by ED provider.  CMP is unremarkable.  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 He has never used smokeless tobacco. He reports that he does not drink alcohol and does not use drugs. Social History  No Known Allergies  Family History  Problem Relation Age of Onset   Diabetes Mother    Heart failure Mother    Hypertension Mother    Diabetes Father    Heart failure Father    Hypertension Father    Cancer Father      Prior to Admission medications   Medication Sig Start Date End Date Taking? Authorizing Provider  acetaminophen (TYLENOL) 325 MG tablet Take 325 mg by mouth every 6 (six)  hours as needed for moderate pain or headache.   Yes [provider]  cetirizine (ZYRTEC) 10 MG tablet Take 10 mg by mouth daily.   Yes [provider]  erythromycin ophthalmic ointment Place 1 application into the left eye daily at 6 (six) AM. 08/22/20  Yes [provider]  liver oil-zinc oxide (DESITIN) 40 % ointment Apply 1 application topically daily as needed for irritation.   Yes [provider]  oxyCODONE-acetaminophen (PERCOCET/ROXICET) 5-325 MG tablet Take 1-2 tablets by mouth every 6 (six) hours as needed for moderate pain. Patient not taking: No sig reported 02/24/19    Harrie Foreman, PA-C  sulfamethoxazole-trimethoprim (BACTRIM DS) 800-160 MG tablet Take 1 tablet by mouth 2 (two) times daily. Patient not taking: No sig reported 01/06/19   Malen Gauze, MD    Physical Exam: Vitals:   08/22/20 2100 08/22/20 2146 08/22/20 2202 08/22/20 2230  BP: (!) 152/108 96/70 (!) 117/94 95/76  Pulse: 95 97 93 89  Resp: 17 18 16 20   Temp:  99.4 F (37.4 C) 99.2 F (37.3 C)   TempSrc:  Oral Oral   SpO2: 96% 100% 97% 90%    Constitutional: NAD, calm, comfortable, thin middle age male laying in bed in decorticate posturing Vitals:   08/22/20 2100 08/22/20 2146 08/22/20 2202 08/22/20 2230  BP: (!) 152/108 96/70 (!) 117/94 95/76  Pulse: 95 97 93 89  Resp: 17 18 16 20   Temp:  99.4 F (37.4 C) 99.2 F (37.3 C)   TempSrc:  Oral Oral   SpO2: 96% 100% 97% 90%   Eyes: PERRL, conjunctivae normal, erythema and edema of the left upper and lower eyelid with crusting scaly lesion on skin ENMT: Mucous membranes are moist.  Neck: normal, supple Respiratory: clear to auscultation bilaterally, no wheezing, no crackles. Normal respiratory effort. No accessory muscle use.  Cardiovascular: Regular rate and rhythm, no murmurs / rubs / gallops. No extremity edema.  Abdomen: no tenderness, no masses palpated.  Bowel sounds positive.  Musculoskeletal: no clubbing / cyanosis. No joint deformity upper and lower extremities.  Skin: see eye lesions as above, small <0.5in circular pressure ulcer on right hip with subcutaneous tissue exposure.  Neurologic: Alert and follows instruction such as keeping eye closed and moving extremities. Decorticate posturing. Non-verbal. Psychiatric: Alert but non-verbal.    Labs on Admission: I have personally reviewed following labs and imaging studies  CBC: Recent Labs  Lab 08/22/20 1804  WBC 8.1  HGB 7.2*  HCT 26.0*  MCV 78.1*  PLT 612*   Basic Metabolic Panel: Recent Labs  Lab 08/22/20 1804  NA 137  K 4.1  CL 105  CO2 26   GLUCOSE 97  BUN 21*  CREATININE 0.80  CALCIUM 8.3*   GFR: CrCl cannot be calculated (Unknown ideal weight.). Liver Function Tests: Recent Labs  Lab 08/22/20 1804  AST 19  ALT 23  ALKPHOS 86  BILITOT 0.3  PROT 6.6  ALBUMIN 3.3*   No results for input(s): LIPASE, AMYLASE in the last 168 hours. No results for input(s): AMMONIA in the last 168 hours. Coagulation Profile: No results for input(s): INR, PROTIME in the last 168 hours. Cardiac Enzymes: No results for input(s): CKTOTAL, CKMB, CKMBINDEX, TROPONINI in the last 168 hours. BNP (last 3 results) No results for input(s): PROBNP in the last 8760 hours. HbA1C: No results for input(s): HGBA1C in the last 72 hours. CBG: No results for input(s): GLUCAP in the last 168 hours. Lipid Profile: No results for  input(s): CHOL, HDL, LDLCALC, TRIG, CHOLHDL, LDLDIRECT in the last 72 hours. Thyroid Function Tests: No results for input(s): TSH, T4TOTAL, FREET4, T3FREE, THYROIDAB in the last 72 hours. Anemia Panel: No results for input(s): VITAMINB12, FOLATE, FERRITIN, TIBC, IRON, RETICCTPCT in the last 72 hours. Urine analysis:    Component Value Date/Time   COLORURINE YELLOW 11/24/2014 1630   APPEARANCEUR CLEAR 11/24/2014 1630   LABSPEC 1.015 11/24/2014 1630   PHURINE 7.5 11/24/2014 1630   GLUCOSEU NEGATIVE 11/24/2014 1630   HGBUR LARGE (A) 11/24/2014 1630   BILIRUBINUR NEGATIVE 11/24/2014 1630   KETONESUR NEGATIVE 11/24/2014 1630   PROTEINUR 30 (A) 11/24/2014 1630   UROBILINOGEN 0.2 11/24/2014 1630   NITRITE POSITIVE (A) 11/24/2014 1630   LEUKOCYTESUR LARGE (A) 11/24/2014 1630    Radiological Exams on Admission: No results found.    Assessment/Plan  Acute blood loss anemia/Upper GI bleed -Hgb of 7.2 from prior of 11 a year ago.FOBT positive. Has increasing lethargic. Will transfuse 1u pRBC and then follow hgb. -GI has been notified by ED provider to see in consultation tomorrow -NPO -check iron panel, vitamin B12  and folate -Daily IV PPI   Hypotension -suspect more erroneous reading with cuff and wrist and pt moving. 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  Dispo: The patient is from: Home              Anticipated d/c is to: Home              Patient currently is not medically stable to d/c.   Difficult to place patient No         Anselm Jungling DO [facility]   If 7PM-7AM, please contact night-coverage [email or web address]   08/22/2020, 11:19 PM

## 2020-08-22 NOTE — ED Provider Notes (Signed)
[facility]-EMERGENCY DEPT Provider Note   CSN: [id number] Arrival date & time: 08/22/20  1746     History Chief Complaint  Patient presents with   Low hemoglobin    6.0    [name] is a 60 y.o. male with past medical history significant for cerebral palsy, paralysis who presents for evaluation of fatigue and low hemoglobin.  Was seen by PCP today for generalized weakness.  Apparently was seen 1 month ago for upper abdominal pain.  Diagnosed with constipation.  Sister provides majority of history as patient is nonverbal.  He has had intermittent poor appetite however good appetite today.  Has not been complaining about any abdominal pain.  Has not noticed any changes in stools.  Has been having "normal" bowel movements.  Has never had a colonoscopy.  Has recently been "more sad" recently.  Apparently there have been a few deaths in the family and patient has been depressed.  Was found to have a hemoglobin of 6 at PCP office.  No bloody emesis.  No chronic NSAID use, EtOH use.  No hematuria at home. Sent here for evaluation and transfusion.  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   Nephrolithiasis 01/03/2019    Past Surgical History:  Procedure Laterality Date   IR NEPHROSTOMY PLACEMENT RIGHT  01/03/2019   ROBOT ASSISTED LAPAROSCOPIC NEPHRECTOMY Right 02/24/2019   Procedure: XI ROBOTIC ASSISTED LAPAROSCOPIC NEPHRECTOMY;  Surgeon: Malen Gauze, MD;  Location: [facility];  Service: Urology;  Laterality: Right;  3 HRS   TENDON RELEASE         Family History  Problem Relation Age of Onset   Diabetes Mother     Heart failure Mother    Hypertension Mother    Diabetes Father    Heart failure Father    Hypertension Father    Cancer Father     Social History   Tobacco Use   Smoking status: Never   Smokeless tobacco: Never  Vaping Use   Vaping Use: Never used  Substance Use Topics   Alcohol use: No   Drug use: No    Home Medications Prior to Admission medications   Medication Sig Start Date End Date Taking? Authorizing Provider  acetaminophen (TYLENOL) 325 MG tablet Take 325 mg by mouth every 6 (six) hours as needed for moderate pain or headache.    [provider]  liver oil-zinc oxide (DESITIN) 40 % ointment Apply 1 application topically as needed for irritation.    [provider]  oxyCODONE-acetaminophen (PERCOCET/ROXICET) 5-325 MG tablet Take 1-2 tablets by mouth every 6 (six) hours as needed for moderate pain. Patient not taking: Reported on 03/09/2019 02/24/19   Harrie Foreman, PA-C  sulfamethoxazole-trimethoprim (BACTRIM DS) 800-160 MG tablet Take 1 tablet by mouth 2 (two) times daily. Patient not taking: Reported on 02/09/2019 01/06/19   Malen Gauze, MD    Allergies    Patient has no known allergies.  Review of Systems   Review of Systems  Constitutional:  Positive for fatigue. Negative for activity change, appetite change, chills, diaphoresis, fever  and unexpected weight change.  HENT: Negative.    Respiratory: Negative.    Cardiovascular: Negative.   Gastrointestinal: Negative.   Genitourinary: Negative.   Musculoskeletal: Negative.   Skin: Negative.   Neurological:  Positive for weakness (Generalized).  All other systems reviewed and are negative.  Physical Exam Updated Vital Signs BP 100/80   Pulse 68   Temp 98.2 F (36.8 C) (Oral)   Resp 18   SpO2 98%   Physical Exam Vitals and nursing note reviewed.  Constitutional:      General: He is not in acute distress.    Appearance: He is well-developed. He is not ill-appearing,  toxic-appearing or diaphoretic.  HENT:     Head: Normocephalic and atraumatic.     Nose: Nose normal.     Mouth/Throat:     Mouth: Mucous membranes are moist.  Eyes:     Pupils: Pupils are equal, round, and reactive to light.  Cardiovascular:     Rate and Rhythm: Normal rate and regular rhythm.     Pulses: Normal pulses.     Heart sounds: Normal heart sounds.  Pulmonary:     Effort: Pulmonary effort is normal. No respiratory distress.     Breath sounds: Normal breath sounds.  Abdominal:     General: Bowel sounds are normal. There is no distension.     Palpations: Abdomen is soft.  Genitourinary:    Rectum: Guaiac result positive.     Comments: Dark brown stool in vault. Non tender rectum  Musculoskeletal:        General: Deformity (Contractions to extremities) present. Normal range of motion.     Cervical back: Normal range of motion and neck supple.  Skin:    General: Skin is warm and dry.     Capillary Refill: Capillary refill takes less than 2 seconds.  Neurological:     General: No focal deficit present.     Mental Status: He is alert and oriented to person, place, and time.    ED Results / Procedures / Treatments   Labs (all labs ordered are listed, but only abnormal results are displayed) Labs Reviewed  CBC - Abnormal; Notable for the following components:      Result Value   RBC 3.33 (*)    Hemoglobin 7.2 (*)    HCT 26.0 (*)    MCV 78.1 (*)    MCH 21.6 (*)    MCHC 27.7 (*)    RDW 15.6 (*)    Platelets 612 (*)    All other components within normal limits  COMPREHENSIVE METABOLIC PANEL  POC OCCULT BLOOD, ED  TYPE AND SCREEN    EKG None  Radiology No results found.  CTAP WO 07/18/20  IMPRESSION:  1. Constipated appearance with over distended rectum and lower  sigmoid by stool.  2. Findings of chronic bladder outlet obstruction. The bladder is  moderately distended and distorted due to the rectal stool burden.  3. Chronic thickening of the lower  esophagus, often reflux related.    Electronically Signed    By: Marnee Spring M.D.    On: 07/18/2020 04:46   Procedures .Critical Care  Date/Time: 08/22/2020 8:25 PM Performed by: Linwood Dibbles, PA-C Authorized by: Linwood Dibbles, PA-C   Critical care provider statement:    Critical care time (minutes):  45   Critical care was necessary to treat or prevent imminent or life-threatening deterioration of the following conditions:  Circulatory failure   Critical care was time spent  personally by me on the following activities:  Discussions with consultants, evaluation of patient's response to treatment, examination of patient, ordering and performing treatments and interventions, ordering and review of laboratory studies, ordering and review of radiographic studies, pulse oximetry, re-evaluation of patient's condition, obtaining history from patient or surrogate and review of old charts   Medications Ordered in ED Medications - No data to display  ED Course  I have reviewed the triage vital signs and the nursing notes.  Pertinent labs & imaging results that were available during my care of the patient were reviewed by me and considered in my medical decision making (see chart for details).  60 year old here for anemia.  Intermittent epigastric pain over the last month.  Low hemoglobin of 6.0 at PCP office today.  He is afebrile, nonseptic.  History of cerebral palsy and has some contractions at baseline and is nonverbal.  His heart and lungs are clear.  His abdomen is soft, nontender.  He has dark stool on exam which is occult positive.  Plan on labs, imaging and reassess  Labs and imaging personally reviewed and interpreted: CBC without leukocytosis, hemoglobin 7.2 down from eight 1 month ago, 1 year ago 11.1 Metabolic panel with BUN 21 no additional electrolyte, renal or liver abnormality Occult positive  Discussed results and labs with family in room.  I recommended blood  transfusion for symptomatic anemia.  Family in room is agreeable.  Will admit for likely GI bleed, will give dose of Protonix.  [facility] team has been added to patient's care team and I have sent message to Dr. Bosie Clos to make his team aware of patients admission.  CONSULT with Dr. Cyndia Bent who will evaluate patient for admission.  The patient appears reasonably stabilized for admission considering the current resources, flow, and capabilities available in the ED at this time, and I doubt any other [facility] requiring further screening and/or treatment in the ED prior to admission.     MDM Rules/Calculators/A&P                          Symptomatic anemia s/p likely GIB with occult positive stool. Hemodynamically stable Final Clinical Impression(s) / ED Diagnoses Final diagnoses:  Gastrointestinal hemorrhage with melena  Symptomatic anemia    Rx / DC Orders ED Discharge Orders     None        Anaisa Radi A, PA-C 08/22/20 2328    Benjiman Core, MD 08/22/20 2332

## 2020-08-23 ENCOUNTER — Other Ambulatory Visit: Payer: Self-pay

## 2020-08-23 DIAGNOSIS — K922 Gastrointestinal hemorrhage, unspecified: Secondary | ICD-10-CM | POA: Diagnosis present

## 2020-08-23 DIAGNOSIS — Z833 Family history of diabetes mellitus: Secondary | ICD-10-CM | POA: Diagnosis not present

## 2020-08-23 DIAGNOSIS — Z993 Dependence on wheelchair: Secondary | ICD-10-CM | POA: Diagnosis not present

## 2020-08-23 DIAGNOSIS — D649 Anemia, unspecified: Secondary | ICD-10-CM | POA: Diagnosis not present

## 2020-08-23 DIAGNOSIS — K921 Melena: Secondary | ICD-10-CM | POA: Diagnosis not present

## 2020-08-23 DIAGNOSIS — L89213 Pressure ulcer of right hip, stage 3: Secondary | ICD-10-CM | POA: Diagnosis not present

## 2020-08-23 DIAGNOSIS — Z681 Body mass index (BMI) 19 or less, adult: Secondary | ICD-10-CM | POA: Diagnosis not present

## 2020-08-23 DIAGNOSIS — D509 Iron deficiency anemia, unspecified: Secondary | ICD-10-CM | POA: Diagnosis not present

## 2020-08-23 DIAGNOSIS — Z8249 Family history of ischemic heart disease and other diseases of the circulatory system: Secondary | ICD-10-CM | POA: Diagnosis not present

## 2020-08-23 DIAGNOSIS — G809 Cerebral palsy, unspecified: Secondary | ICD-10-CM | POA: Diagnosis not present

## 2020-08-23 DIAGNOSIS — Z79899 Other long term (current) drug therapy: Secondary | ICD-10-CM | POA: Diagnosis not present

## 2020-08-23 DIAGNOSIS — D62 Acute posthemorrhagic anemia: Secondary | ICD-10-CM | POA: Diagnosis not present

## 2020-08-23 DIAGNOSIS — H5789 Other specified disorders of eye and adnexa: Secondary | ICD-10-CM | POA: Diagnosis not present

## 2020-08-23 DIAGNOSIS — Z7401 Bed confinement status: Secondary | ICD-10-CM | POA: Diagnosis not present

## 2020-08-23 DIAGNOSIS — R630 Anorexia: Secondary | ICD-10-CM | POA: Diagnosis present

## 2020-08-23 DIAGNOSIS — Z20822 Contact with and (suspected) exposure to covid-19: Secondary | ICD-10-CM | POA: Diagnosis not present

## 2020-08-23 DIAGNOSIS — G839 Paralytic syndrome, unspecified: Secondary | ICD-10-CM | POA: Diagnosis not present

## 2020-08-23 DIAGNOSIS — Z66 Do not resuscitate: Secondary | ICD-10-CM | POA: Diagnosis not present

## 2020-08-23 LAB — VITAMIN B12: Vitamin B-12: 279 pg/mL (ref 180–914)

## 2020-08-23 LAB — CBC
HCT: 28.1 % — ABNORMAL LOW (ref 39.0–52.0)
Hemoglobin: 8.1 g/dL — ABNORMAL LOW (ref 13.0–17.0)
MCH: 23 pg — ABNORMAL LOW (ref 26.0–34.0)
MCHC: 28.8 g/dL — ABNORMAL LOW (ref 30.0–36.0)
MCV: 79.8 fL — ABNORMAL LOW (ref 80.0–100.0)
Platelets: 508 10*3/uL — ABNORMAL HIGH (ref 150–400)
RBC: 3.52 MIL/uL — ABNORMAL LOW (ref 4.22–5.81)
RDW: 16.1 % — ABNORMAL HIGH (ref 11.5–15.5)
WBC: 6.6 10*3/uL (ref 4.0–10.5)
nRBC: 0 % (ref 0.0–0.2)

## 2020-08-23 LAB — BPAM RBC
Blood Product Expiration Date: 202208012359
ISSUE DATE / TIME: 202206292054
Unit Type and Rh: 5100

## 2020-08-23 LAB — TYPE AND SCREEN
ABO/RH(D): O POS
Antibody Screen: NEGATIVE
Unit division: 0

## 2020-08-23 LAB — IRON AND TIBC
Iron: 53 ug/dL (ref 45–182)
Saturation Ratios: 13 % — ABNORMAL LOW (ref 17.9–39.5)
TIBC: 393 ug/dL (ref 250–450)
UIBC: 340 ug/dL

## 2020-08-23 LAB — RESP PANEL BY RT-PCR (FLU A&B, COVID) ARPGX2
Influenza A by PCR: NEGATIVE
Influenza B by PCR: NEGATIVE
SARS Coronavirus 2 by RT PCR: NEGATIVE

## 2020-08-23 LAB — FOLATE: Folate: 9.1 ng/mL (ref >5.9)

## 2020-08-23 MED ORDER — PEG-KCL-NACL-NASULF-NA ASC-C 100 G PO SOLR: 1.0000 | Freq: Once | ORAL | Status: DC

## 2020-08-23 MED ORDER — PEG-KCL-NACL-NASULF-NA ASC-C 100 G PO SOLR
0.5000 | Freq: Once | ORAL | Status: AC
  Administered 2020-08-23: 100 g via ORAL
  Filled 2020-08-23: qty 1

## 2020-08-23 MED ORDER — PEG-KCL-NACL-NASULF-NA ASC-C 100 G PO SOLR
0.5000 | Freq: Once | ORAL | Status: AC
  Administered 2020-08-23: 100 g via ORAL

## 2020-08-23 MED ORDER — PANTOPRAZOLE SODIUM 40 MG IV SOLR
40.0000 mg | Freq: Two times a day (BID) | INTRAVENOUS | Status: DC
  Administered 2020-08-23 – 2020-08-24 (×4): 40 mg via INTRAVENOUS
  Filled 2020-08-23 (×4): qty 40

## 2020-08-23 NOTE — Consult Note (Signed)
[facility] Gastroenterology Consultation Note  Referring Provider: No ref. provider found Primary Care Physician:  Aliene Beams, MD Primary Gastroenterologist:  Dr. Dulce Sellar  Reason for Consultation:  anemia, hemoccult positive stool  HPI: [name] is a 60 y.o. male whom we were asked to see for above reason.  Patient has history of cerebral palsy, bed-bound, sister [name] provides patient's history.  Patient has not been as energetic as usual over the past several weeks.  Was found to be anemic, with hemoccult positive stools.  Hgb drop from ~ 11 to ~ 7 over the past several months.  I saw patient last year for evaluation of colon cancer screening; we opted for Cologuard, which for patient collection purposes seems never to have been completed.  No overt blood in stool (no hematemesis, melena, hematochezia).  No NSAIDs.  Chronic constipation, chronic narcotics.   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 Authorizing Provider  acetaminophen (TYLENOL) 325 MG tablet Take 325 mg by mouth every 6 (six) hours as needed for moderate pain or headache.   Yes [provider]  cetirizine (ZYRTEC) 10 MG tablet Take 10 mg by mouth daily.   Yes [provider]  erythromycin ophthalmic ointment Place 1 application into the left eye daily at 6 (six) AM. 08/22/20  Yes  [provider]  liver oil-zinc oxide (DESITIN) 40 % ointment Apply 1 application topically daily as needed for irritation.   Yes [provider]  oxyCODONE-acetaminophen (PERCOCET/ROXICET) 5-325 MG tablet Take 1-2 tablets by mouth every 6 (six) hours as needed for moderate pain. Patient not taking: No sig reported 02/24/19   Harrie Foreman, PA-C  sulfamethoxazole-trimethoprim (BACTRIM DS) 800-160 MG tablet Take 1 tablet by mouth 2 (two) times daily. Patient not taking: No sig reported 01/06/19   Malen Gauze, MD    Current Facility-Administered Medications  Medication Dose Route Frequency Provider Last Rate Last Admin   acetaminophen (TYLENOL) tablet 325 mg  325 mg Oral Q6H PRN Tu, Ching T, DO       loratadine (CLARITIN) tablet 10 mg  10 mg Oral Daily Tu, Ching T, DO   10 mg at 08/23/20 0920   pantoprazole (PROTONIX) injection 40 mg  40 mg Intravenous Q12H Noralee Stain, DO        Allergies as of 08/22/2020   (No Known Allergies)    Family History  Problem Relation Age of Onset   Diabetes Mother    Heart failure Mother    Hypertension Mother    Diabetes Father    Heart failure Father    Hypertension Father    Cancer Father     Social History   Socioeconomic History   Marital status: Single    Spouse name: Not on file   Number of children: Not on file  Years of education: Not on file   Highest education level: Not on file  Occupational History   Not on file  Tobacco Use   Smoking status: Never   Smokeless tobacco: Never  Vaping Use   Vaping Use: Never used  Substance and Sexual Activity   Alcohol use: No   Drug use: No   Sexual activity: Not on file  Other Topics Concern   Not on file  Social History Narrative   Not on file   Social Determinants of Health   Financial Resource Strain: Not on file  Food Insecurity: Not on file  Transportation Needs: Not on file  Physical Activity: Not on file  Stress: Not on file  Social  Connections: Not on file  Intimate Partner Violence: Not on file    Review of Systems: Unable to obtain due to communication difficulties with cerebral palsy  Physical Exam: Vital signs in last 24 hours: Temp:  [97.5 F (36.4 C)-99.4 F (37.4 C)] 97.5 F (36.4 C) (06/30 1023) Pulse Rate:  [68-102] 84 (06/30 1023) Resp:  [13-20] 16 (06/30 1023) BP: (95-154)/(70-130) 143/99 (06/30 1023) SpO2:  [90 %-100 %] 99 % (06/30 1023) Weight:  [58.2 kg] 58.2 kg (06/30 0130) Last BM Date: 08/23/20 General:   Alert,  unable to answer questions reliably, pleasant and cooperative in NAD Head:  Normocephalic and atraumatic. Eyes:  Sclera clear, no icterus.   Conjunctiva pink. Ears:  Normal auditory acuity. Nose:  No deformity, discharge,  or lesions. Mouth:  No deformity or lesions.  Oropharynx pink & moist. Neck:  Supple; no masses or thyromegaly. Abdomen:  Soft, nontender and nondistended. No masses, hepatosplenomegaly or hernias noted. Normal bowel sounds, without guarding, and without rebound.     Msk:  Upper extremity and lower extremity contractures, otherwise Symmetrical without gross deformities. Normal posture. Pulses:  Normal pulses noted. Extremities:  Without clubbing or edema. Neurologic:  Alert not oriented Psych:  Alert and cooperative. Normal mood and affect.   Lab Results: Recent Labs    08/22/20 1804 08/23/20 0445  WBC 8.1 6.6  HGB 7.2* 8.1*  HCT 26.0* 28.1*  PLT 612* 508*   BMET Recent Labs    08/22/20 1804  NA 137  K 4.1  CL 105  CO2 26  GLUCOSE 97  BUN 21*  CREATININE 0.80  CALCIUM 8.3*   LFT Recent Labs    08/22/20 1804  PROT 6.6  ALBUMIN 3.3*  AST 19  ALT 23  ALKPHOS 86  BILITOT 0.3   PT/INR No results for input(s): LABPROT, INR in the last 72 hours.  Studies/Results: No results found.  Impression:   Anemia.  Hemoccult positive stools. Cerebral palsy.  History cellulitis and decubitus ulcers Constipation. GE junction thickening,  suspect reflux esophagitis.  Plan:   Case discussed at length with patient's sister, [name], at bedside.  Had CT scan showing constipation at [facility] about one month ago without other obvious pathology/lesion.  Prior outpatient efforts at colon cancer screening unsuccessful.  Weighing pros/cons, patient's sister [name] Development worker, community) and I have decided to pursue endoscopy (to assess GE junction thickening) and colonoscopy (to assess anemia and HPS) while patient is inpatient.  Patient will require a protracted 2-3 day bowel preparation to get cleaned out enough for colonoscopy. [facility] will follow.   LOS: 0 days   Amyriah Buras M  08/23/2020, 1:24 PM  Cell [phone] If no answer or after 5 PM call [phone]

## 2020-08-23 NOTE — Consult Note (Addendum)
WOC Nurse Consult Note: Reason for Consult: Consult requested for right hip.  Pt is contracted and it is difficult to reposition off the affected area, according to his family member at the bedside. She states the wound has been there "awhile, and has improved.  She was using a hydrocolloid to the location prior to admission." Wound type: Chronic stage 3 pressure injury; 1.8X1.8X.2cm, 85% red, 15% yellow, small amt tan drainage, no odor or fluctuance.  Pressure Injury POA: Yes Dressing procedure/placement/frequency: Topical treatment orders provided for bedside nurses  to perform as follows: Foam dressing to right hip (change Q 3 days or PRN soiling.) Please re-consult if further assistance is needed.   Thank-you,  Cammie Mcgee MSN, RN, CWOCN, [place], CNS [phone]

## 2020-08-23 NOTE — Progress Notes (Signed)
PROGRESS NOTE    [name]  ATF:[id number] DOB: 1960-03-25 DOA: 08/22/2020 PCP: Aliene Beams, MD     Brief Narrative:  [name] is a 60 y.o. male with medical history significant for cerebral palsy-nonverbal and wheelchair-bound at baseline, pressure ulcer wound POA, history of right nephrectomy who presents with sister's concerns of increasing lethargy.  Sister reports that for the past months he has had on and off lethargy.  There will be days where he will sleep more than usual.  States normally he is very interactive and "bubbly."  Also had lower appetite. Normally he is able to drink out of a straw on his own but lately has needed more help. She presented to his PCP with these symptoms and he was found to have hemoglobin of 6 and asked to present to the ED. She denies seeing any bright red blood per rectum or dark stool.  He has never had a colonoscopy.  He does not take aspirin or any NSAIDs over-the-counter.  New events last 24 hours / Subjective: No new events overnight.  Sister remains at his bedside.  Assessment & Plan:   Principal Problem:   Acute anemia Active Problems:   Cellulitis   Pressure ulcer   Cerebral palsy (HCC)   GI bleed   Acute blood loss anemia, GI bleed -Status post 1 unit packed red blood cell 6/29 -FOBT positive -GI consulted -Remains n.p.o. -IV PPI  -Trend CBC  Left eye irritation -Sister states that patient has had waxing and waning irritation and swelling with drainage of the left eye over the past 1 to 2 months.  He will wake up with some crusting of the left eye, and she will clean it, then the swelling and drainage will continue to improve spontaneously without any treatment.  Unknown if there are any exacerbating factors. -On examination today, there is no drainage or crusting, eyelid is dry without significant erythema.  There is some scabbing noted on the upper eyelid.  Does not look actively infected  Cerebral palsy -Patient is  nonverbal and wheelchair-bound at baseline.  Lives at home with sister who is his caregiver   In agreement with assessment of the pressure ulcer as below:  Pressure Injury 08/22/20 Hip Right Stage 3 -  Full thickness tissue loss. Subcutaneous fat may be visible but bone, tendon or muscle are NOT exposed. (Active)  08/22/20   Location: Hip  Location Orientation: Right  Staging: Stage 3 -  Full thickness tissue loss. Subcutaneous fat may be visible but bone, tendon or muscle are NOT exposed.  Wound Description (Comments):   Present on Admission: Yes         DVT prophylaxis:  SCDs Start: 08/22/20 2304  Code Status:     Code Status Orders  (From admission, onward)           Start     Ordered   08/22/20 2318  Do not attempt resuscitation (DNR)  Continuous       Question Answer Comment  In the event of cardiac or respiratory ARREST Do not call a "code blue"   In the event of cardiac or respiratory ARREST Do not perform Intubation, CPR, defibrillation or ACLS   In the event of cardiac or respiratory ARREST Use medication by any route, position, wound care, and other measures to relive pain and suffering. May use oxygen, suction and manual treatment of airway obstruction as needed for comfort.      08/22/20 2317  Code Status History     Date Active Date Inactive Code Status Order ID Comments User Context   08/22/2020 2307 08/22/2020 2317 Full Code [id number]  Anselm Jungling, DO ED   02/24/2019 2000 03/02/2019 2152 Full Code [id number]  [facility] In[facility]   01/03/2019 1204 01/06/2019 1836 Full Code [id number]  Malen Gauze, MD Inpatient      Family Communication: Sister at bedside Disposition Plan:  Status is: Inpatient  Remains inpatient appropriate because:Inpatient level of care appropriate due to severity of illness  Dispo: The patient is from: Home              Anticipated d/c is to: Home              Patient currently is not medically stable  to d/c.   Difficult to place patient Yes      Consultants:  GI  Procedures:  None   Antimicrobials:  Anti-infectives (From admission, onward)    Start     Dose/Rate Route Frequency Ordered Stop   08/23/20 0000  cephALEXin (KEFLEX) capsule 250 mg        250 mg Oral Every 6 hours 08/22/20 2316 08/29/20 2359        Objective: Vitals:   08/23/20 0130 08/23/20 0135 08/23/20 0633 08/23/20 1023  BP:  (!) 129/98 117/86 (!) 143/99  Pulse:  84 75 84  Resp:  16 16 16   Temp:  98.4 F (36.9 C) 97.7 F (36.5 C) (!) 97.5 F (36.4 C)  TempSrc:  Oral Oral Oral  SpO2:  98% 96% 99%  Weight: 58.2 kg     Height: 5\' 8"  (1.727 m)       Intake/Output Summary (Last 24 hours) at 08/23/2020 1221 Last data filed at 08/23/2020 1000 Gross per 24 hour  Intake 441.33 ml  Output --  Net 441.33 ml   Filed Weights   08/23/20 0130  Weight: 58.2 kg    Examination:  General exam: Appears calm and comfortable  Respiratory system: Clear to auscultation. Respiratory effort normal.  Cardiovascular system: S1 & S2 heard, RRR. No murmurs. No pedal edema. Gastrointestinal system: Abdomen is nondistended, soft and nontender.  Central nervous system: Alert  Extremities: +Contractures of extremities consistent with cerebral palsy dx    Data Reviewed: I have personally reviewed following labs and imaging studies  CBC: Recent Labs  Lab 08/22/20 1804 08/23/20 0445  WBC 8.1 6.6  HGB 7.2* 8.1*  HCT 26.0* 28.1*  MCV 78.1* 79.8*  PLT 612* 508*   Basic Metabolic Panel: Recent Labs  Lab 08/22/20 1804  NA 137  K 4.1  CL 105  CO2 26  GLUCOSE 97  BUN 21*  CREATININE 0.80  CALCIUM 8.3*   GFR: Estimated Creatinine Clearance: 82.9 mL/min (by C-G formula based on SCr of 0.8 mg/dL). Liver Function Tests: Recent Labs  Lab 08/22/20 1804  AST 19  ALT 23  ALKPHOS 86  BILITOT 0.3  PROT 6.6  ALBUMIN 3.3*   No results for input(s): LIPASE, AMYLASE in the last 168 hours. No results for  input(s): AMMONIA in the last 168 hours. Coagulation Profile: No results for input(s): INR, PROTIME in the last 168 hours. Cardiac Enzymes: No results for input(s): CKTOTAL, CKMB, CKMBINDEX, TROPONINI in the last 168 hours. BNP (last 3 results) No results for input(s): PROBNP in the last 8760 hours. HbA1C: No results for input(s): HGBA1C in the last 72 hours. CBG: No results for input(s): GLUCAP in the last 168 hours.  Lipid Profile: No results for input(s): CHOL, HDL, LDLCALC, TRIG, CHOLHDL, LDLDIRECT in the last 72 hours. Thyroid Function Tests: No results for input(s): TSH, T4TOTAL, FREET4, T3FREE, THYROIDAB in the last 72 hours. Anemia Panel: Recent Labs    08/23/20 0445  VITAMINB12 279  FOLATE 9.1  TIBC 393  IRON 53   Sepsis Labs: No results for input(s): PROCALCITON, LATICACIDVEN in the last 168 hours.  Recent Results (from the past 240 hour(s))  Resp Panel by RT-PCR (Flu A&B, Covid) Nasopharyngeal Swab     Status: None   Collection Time: 08/22/20 11:50 PM   Specimen: Nasopharyngeal Swab; Nasopharyngeal(NP) swabs in vial transport medium  Result Value Ref Range Status   SARS Coronavirus 2 by RT PCR NEGATIVE NEGATIVE Final    Comment: (NOTE) SARS-CoV-2 target nucleic acids are NOT DETECTED.  The SARS-CoV-2 RNA is generally detectable in upper respiratory specimens during the acute phase of infection. The lowest concentration of SARS-CoV-2 viral copies this assay can detect is 138 copies/mL. A negative result does not preclude SARS-Cov-2 infection and should not be used as the sole basis for treatment or other patient management decisions. A negative result may occur with  improper specimen collection/handling, submission of specimen other than nasopharyngeal swab, presence of viral mutation(s) within the areas targeted by this assay, and inadequate number of viral copies(<138 copies/mL). A negative result must be combined with clinical observations, patient history,  and epidemiological information. The expected result is Negative.  Fact Sheet for Patients:  BloggerCourse.com  Fact Sheet for Healthcare Providers:  SeriousBroker.it  This test is no t yet approved or cleared by the [place] FDA and  has been authorized for detection and/or diagnosis of SARS-CoV-2 by FDA under an Emergency Use Authorization (EUA). This EUA will remain  in effect (meaning this test can be used) for the duration of the COVID-19 declaration under Section 564(b)(1) of the Act, 21 U.S.C.section 360bbb-3(b)(1), unless the authorization is terminated  or revoked sooner.       Influenza A by PCR NEGATIVE NEGATIVE Final   Influenza B by PCR NEGATIVE NEGATIVE Final    Comment: (NOTE) The Xpert Xpress SARS-CoV-2/FLU/RSV plus assay is intended as an aid in the diagnosis of influenza from Nasopharyngeal swab specimens and should not be used as a sole basis for treatment. Nasal washings and aspirates are unacceptable for Xpert Xpress SARS-CoV-2/FLU/RSV testing.  Fact Sheet for Patients: BloggerCourse.com  Fact Sheet for Healthcare Providers: SeriousBroker.it  This test is not yet approved or cleared by the [place] FDA and has been authorized for detection and/or diagnosis of SARS-CoV-2 by FDA under an Emergency Use Authorization (EUA). This EUA will remain in effect (meaning this test can be used) for the duration of the COVID-19 declaration under Section 564(b)(1) of the Act, 21 U.S.C. section 360bbb-3(b)(1), unless the authorization is terminated or revoked.  Performed at [facility], [address], [place], [place] [address]       Radiology Studies: No results found.    Scheduled Meds:  cephALEXin  250 mg Oral Q6H   loratadine  10 mg Oral Daily   pantoprazole (PROTONIX) IV  40 mg Intravenous QHS   Continuous Infusions:    LOS: 0 days      Time spent: 25 minutes   Noralee Stain, DO [facility] 08/23/2020, 12:21 PM   Available via Epic secure chat 7am-7pm After these hours, please refer to coverage provider listed on [email or web address]

## 2020-08-23 NOTE — ED Notes (Signed)
ED TO INPATIENT HANDOFF REPORT  ED Nurse Name and Phone #: Charise Carwin, RN [phone]  S Name/Age/Gender [name] 60 y.o. male Room/Bed: WA17/WA17  Code Status   Code Status: DNR  Home/SNF/Other Home Patient oriented to: self Is this baseline? Yes   Triage Complete: Triage complete  Chief Complaint Acute anemia [D64.9]  Triage Note Patient seen at PCP for C/o lethargy and muscle spasms.  C/o abdominal pain   Hemoglobin came back at 6.0.   Patient wheelchair bound.   Hx: constipation a couple weeks ago and had CT     Allergies No Known Allergies  Level of Care/Admitting Diagnosis ED Disposition    ED Disposition  Admit   Condition  --   Comment  Hospital Area: [facility] [[id number]]  Level of Care: Progressive [102]  Admit to Progressive based on following criteria: GI, ENDOCRINE disease patients with GI bleeding, acute liver failure or pancreatitis, stable with diabetic ketoacidosis or thyrotoxicosis (hypothyroid) state.  May place patient in observation at [facility] or [facility] if equivalent level of care is available:: No  Covid Evaluation: Asymptomatic Screening Protocol (No Symptoms)  Diagnosis: Acute anemia [7628315]  Admitting Physician: Anselm Jungling [[id number]]  Attending Physician: Anselm Jungling [[id number]]         B Medical/Surgery History Past Medical History:  Diagnosis Date  . Cerebral palsy (HCC)   . Complication of anesthesia    Nausated for 1 1/2 days after the procedure  . 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 PONV (postoperative nausea and vomiting)    Past Surgical History:  Procedure Laterality Date  . IR NEPHROSTOMY PLACEMENT RIGHT  01/03/2019  . ROBOT ASSISTED LAPAROSCOPIC NEPHRECTOMY Right 02/24/2019   Procedure: XI ROBOTIC ASSISTED LAPAROSCOPIC NEPHRECTOMY;  Surgeon: Malen Gauze, MD;  Location: [facility];  Service: Urology;  Laterality: Right;  3 HRS  . TENDON  RELEASE       A IV Location/Drains/Wounds Patient Lines/Drains/Airways Status    Active Line/Drains/Airways    Name Placement date Placement time Site Days   Peripheral IV 08/22/20 20 G Left;Upper Arm 08/22/20  1827  Arm  1          Intake/Output Last 24 hours No intake or output data in the 24 hours ending 08/23/20 0008  Labs/Imaging Results for orders placed or performed during the hospital encounter of 08/22/20 (from the past 48 hour(s))  Comprehensive metabolic panel     Status: Abnormal   Collection Time: 08/22/20  6:04 PM  Result Value Ref Range   Sodium 137 135 - 145 mmol/L   Potassium 4.1 3.5 - 5.1 mmol/L   Chloride 105 98 - 111 mmol/L   CO2 26 22 - 32 mmol/L   Glucose, Bld 97 70 - 99 mg/dL    Comment: Glucose reference range applies only to samples taken after fasting for at least 8 hours.   BUN 21 (H) 6 - 20 mg/dL   Creatinine, Ser 9.48 0.61 - 1.24 mg/dL   Calcium 8.3 (L) 8.9 - 10.3 mg/dL   Total Protein 6.6 6.5 - 8.1 g/dL   Albumin 3.3 (L) 3.5 - 5.0 g/dL   AST 19 15 - 41 U/L   ALT 23 0 - 44 U/L   Alkaline Phosphatase 86 38 - 126 U/L   Total Bilirubin 0.3 0.3 - 1.2 mg/dL   GFR, Estimated >54 >62 mL/min    Comment: (NOTE) Calculated using the  CKD-EPI Creatinine Equation (2021)    Anion gap 6 5 - 15    Comment: Performed at [facility], [address], [place], [place] [address]  CBC     Status: Abnormal   Collection Time: 08/22/20  6:04 PM  Result Value Ref Range   WBC 8.1 4.0 - 10.5 K/uL   RBC 3.33 (L) 4.22 - 5.81 MIL/uL   Hemoglobin 7.2 (L) 13.0 - 17.0 g/dL   HCT 88.5 (L) 02.7 - 74.1 %   MCV 78.1 (L) 80.0 - 100.0 fL   MCH 21.6 (L) 26.0 - 34.0 pg   MCHC 27.7 (L) 30.0 - 36.0 g/dL   RDW 28.7 (H) 86.7 - 67.2 %   Platelets 612 (H) 150 - 400 K/uL   nRBC 0.0 0.0 - 0.2 %    Comment: Performed at [facility], [address] [place], [place] [address]  Type and screen [facility]     Status: None  (Preliminary result)   Collection Time: 08/22/20  6:04 PM  Result Value Ref Range   ABO/RH(D) O POS    Antibody Screen NEG    Sample Expiration 08/25/2020,2359    Unit Number [id number]    Blood Component Type RED CELLS,LR    Unit division 00    Status of Unit ISSUED    Transfusion Status OK TO TRANSFUSE    Crossmatch Result      Compatible Performed at [facility], [address], [place], [place] [address]   POC occult blood, ED     Status: Abnormal   Collection Time: 08/22/20  7:06 PM  Result Value Ref Range   Fecal Occult Bld POSITIVE (A) NEGATIVE  Prepare RBC (crossmatch)     Status: None   Collection Time: 08/22/20  8:30 PM  Result Value Ref Range   Order Confirmation      ORDER PROCESSED BY BLOOD BANK Performed at [facility] [address], [place], [place] [address]    No results found.  Pending Labs Unresulted Labs (From admission, onward)    Start     Ordered   08/23/20 0500  CBC  Tomorrow morning,   R        08/22/20 2307   08/22/20 2345  Resp Panel by RT-PCR (Flu A&B, Covid) Nasopharyngeal Swab  (Tier 2 - Symptomatic/asymptomatic with Precautions )  Once,   STAT       Question Answer Comment  Is this test for diagnosis or screening Screening   Symptomatic for COVID-19 as defined by CDC No   Hospitalized for COVID-19 No   Admitted to ICU for COVID-19 No   Previously tested for COVID-19 Unknown   Resident in a congregate (group) care setting Unknown   Employed in healthcare setting Unknown   Has patient completed COVID vaccination(s) (2 doses of Pfizer/Moderna 1 dose of [employer]) Unknown      08/22/20 2344   08/22/20 2309  Iron and TIBC  Once,   STAT        08/22/20 2308   08/22/20 2308  Vitamin B12  Once,   STAT        08/22/20 2308   08/22/20 2308  Folate, serum, performed at [facility] lab  Once,   STAT        08/22/20 2308   08/22/20 1907  Urinalysis, Routine w reflex microscopic  Once,   STAT         08/22/20  1906          Vitals/Pain Today's Vitals   08/22/20 2100 08/22/20 2146 08/22/20 2202 08/22/20 2230  BP: (!) 152/108 96/70 (!) 117/94 95/76  Pulse: 95 97 93 89  Resp: 17 18 16 20   Temp:  99.4 F (37.4 C) 99.2 F (37.3 C)   TempSrc:  Oral Oral   SpO2: 96% 100% 97% 90%    Isolation Precautions No active isolations  Medications Medications  cephALEXin (KEFLEX) capsule 250 mg (has no administration in time range)  pantoprazole (PROTONIX) injection 40 mg (has no administration in time range)  acetaminophen (TYLENOL) tablet 325 mg (has no administration in time range)  loratadine (CLARITIN) tablet 10 mg (has no administration in time range)  0.9 %  sodium chloride infusion (10 mL/hr Intravenous New Bag/Given 08/22/20 2151)  pantoprazole (PROTONIX) injection 40 mg (40 mg Intravenous Given 08/22/20 2149)    Mobility non-ambulatory Low fall risk   Focused Assessments    R Recommendations: See Admitting Provider Note  Report given to:   Additional Notes:

## 2020-08-24 LAB — CBC
HCT: 26.9 % — ABNORMAL LOW (ref 39.0–52.0)
Hemoglobin: 8.1 g/dL — ABNORMAL LOW (ref 13.0–17.0)
MCH: 24.3 pg — ABNORMAL LOW (ref 26.0–34.0)
MCHC: 30.1 g/dL (ref 30.0–36.0)
MCV: 80.8 fL (ref 80.0–100.0)
Platelets: 482 10*3/uL — ABNORMAL HIGH (ref 150–400)
RBC: 3.33 MIL/uL — ABNORMAL LOW (ref 4.22–5.81)
RDW: 16.8 % — ABNORMAL HIGH (ref 11.5–15.5)
WBC: 6.7 10*3/uL (ref 4.0–10.5)
nRBC: 0 % (ref 0.0–0.2)

## 2020-08-24 MED ORDER — PEG-KCL-NACL-NASULF-NA ASC-C 100 G PO SOLR: 0.5000 | Freq: Once | ORAL | Status: DC

## 2020-08-24 MED ORDER — PEG-KCL-NACL-NASULF-NA ASC-C 100 G PO SOLR
0.5000 | Freq: Once | ORAL | Status: AC
  Administered 2020-08-24: 100 g via ORAL

## 2020-08-24 MED ORDER — PEG-KCL-NACL-NASULF-NA ASC-C 100 G PO SOLR
0.5000 | Freq: Once | ORAL | Status: AC
  Administered 2020-08-24: 100 g via ORAL
  Filled 2020-08-24: qty 1

## 2020-08-24 NOTE — Progress Notes (Signed)
PROGRESS NOTE    [name]  MKL:[id number] DOB: July 28, 1960 DOA: 08/22/2020 PCP: Caren Macadam, MD     Brief Narrative:  [name] is a 60 y.o. male with medical history significant for cerebral palsy-nonverbal and wheelchair-bound at baseline, pressure ulcer wound POA, history of right nephrectomy who presents with sister's concerns of increasing lethargy.  Sister reports that for the past months he has had on and off lethargy.  There will be days where he will sleep more than usual.  States normally he is very interactive and "bubbly."  Also had lower appetite. Normally he is able to drink out of a straw on his own but lately has needed more help. She presented to his PCP with these symptoms and he was found to have hemoglobin of 6 and asked to present to the ED. She denies seeing any bright red blood per rectum or dark stool.  He has never had a colonoscopy.  He does not take aspirin or any NSAIDs over-the-counter.  Patient was admitted for GI bleeding.  GI was consulted.  New events last 24 hours / Subjective: Sister at bedside states that patient has not slept much overnight due to his bowel prep.  No new issues overnight.  Assessment & Plan:   Principal Problem:   Acute anemia Active Problems:   Cellulitis   Pressure ulcer   Cerebral palsy (HCC)   GI bleed   Acute blood loss anemia, GI bleed -Status post 1 unit packed red blood cell 6/29 -FOBT positive -GI consulted, planning for endoscopy tentatively tomorrow versus Sunday -Remains on clear liquid diet -IV PPI  -Trend CBC, hemoglobin remains stable  Left eye irritation -Sister states that patient has had waxing and waning irritation and swelling with drainage of the left eye over the past 1 to 2 months.  He will wake up with some crusting of the left eye, and she will clean it, then the swelling and drainage will continue to improve spontaneously without any treatment.  Unknown if there are any exacerbating  factors. -On examination, there is no drainage or crusting, eyelid is dry without significant erythema.  There is some scabbing noted on the upper eyelid.  Does not look actively infected -Improved  Cerebral palsy -Patient is nonverbal and wheelchair-bound at baseline.  Lives at home with sister who is his caregiver   In agreement with assessment of the pressure ulcer as below:  Pressure Injury 08/22/20 Hip Right Stage 3 -  Full thickness tissue loss. Subcutaneous fat may be visible but bone, tendon or muscle are NOT exposed. (Active)  08/22/20   Location: Hip  Location Orientation: Right  Staging: Stage 3 -  Full thickness tissue loss. Subcutaneous fat may be visible but bone, tendon or muscle are NOT exposed.  Wound Description (Comments):   Present on Admission: Yes         DVT prophylaxis:  SCDs Start: 08/22/20 2304  Code Status:     Code Status Orders  (From admission, onward)           Start     Ordered   08/22/20 2318  Do not attempt resuscitation (DNR)  Continuous       Question Answer Comment  In the event of cardiac or respiratory ARREST Do not call a "code blue"   In the event of cardiac or respiratory ARREST Do not perform Intubation, CPR, defibrillation or ACLS   In the event of cardiac or respiratory ARREST Use medication by any route,  position, wound care, and other measures to relive pain and suffering. May use oxygen, suction and manual treatment of airway obstruction as needed for comfort.      08/22/20 2317           Code Status History     Date Active Date Inactive Code Status Order ID Comments User Context   08/22/2020 2307 08/22/2020 2317 Full Code [id number]  Orene Desanctis, DO ED   02/24/2019 2000 03/02/2019 2152 Full Code [id number]  Lattie Corns Inpatient   01/03/2019 1204 01/06/2019 1836 Full Code [id number]  McKenzie, Candee Furbish, MD Inpatient      Family Communication: Sister at bedside Disposition Plan:  Status is:  Inpatient  Remains inpatient appropriate because:Inpatient level of care appropriate due to severity of illness  Dispo: The patient is from: Home              Anticipated d/c is to: Home              Patient currently is not medically stable to d/c.   Difficult to place patient Yes      Consultants:  GI  Procedures:  None   Antimicrobials:  Anti-infectives (From admission, onward)    Start     Dose/Rate Route Frequency Ordered Stop   08/23/20 0000  cephALEXin (KEFLEX) capsule 250 mg  Status:  Discontinued        250 mg Oral Every 6 hours 08/22/20 2316 08/23/20 1225        Objective: Vitals:   08/23/20 1023 08/23/20 1429 08/23/20 2123 08/24/20 0439  BP: (!) 143/99 106/79 (!) 121/95 115/73  Pulse: 84 81 87 94  Resp: 16 16 18 16   Temp: (!) 97.5 F (36.4 C) 98.5 F (36.9 C) 98.2 F (36.8 C) 98.6 F (37 C)  TempSrc: Oral Oral Oral Oral  SpO2: 99% 98% 96% 96%  Weight:      Height:       No intake or output data in the 24 hours ending 08/24/20 1258  Filed Weights   08/23/20 0130  Weight: 58.2 kg   Examination: General exam: Appears calm and comfortable  Respiratory system: Clear to auscultation. Respiratory effort normal. Cardiovascular system: S1 & S2 heard, RRR. No pedal edema. Gastrointestinal system: Abdomen is nondistended, soft  Central nervous system: Alert  Extremities: Contractures of extremities present   Data Reviewed: I have personally reviewed following labs and imaging studies  CBC: Recent Labs  Lab 08/22/20 1804 08/23/20 0445 08/24/20 0410  WBC 8.1 6.6 6.7  HGB 7.2* 8.1* 8.1*  HCT 26.0* 28.1* 26.9*  MCV 78.1* 79.8* 80.8  PLT 612* 508* 482*    Basic Metabolic Panel: Recent Labs  Lab 08/22/20 1804  NA 137  K 4.1  CL 105  CO2 26  GLUCOSE 97  BUN 21*  CREATININE 0.80  CALCIUM 8.3*    GFR: Estimated Creatinine Clearance: 82.9 mL/min (by C-G formula based on SCr of 0.8 mg/dL). Liver Function Tests: Recent Labs  Lab  08/22/20 1804  AST 19  ALT 23  ALKPHOS 86  BILITOT 0.3  PROT 6.6  ALBUMIN 3.3*    No results for input(s): LIPASE, AMYLASE in the last 168 hours. No results for input(s): AMMONIA in the last 168 hours. Coagulation Profile: No results for input(s): INR, PROTIME in the last 168 hours. Cardiac Enzymes: No results for input(s): CKTOTAL, CKMB, CKMBINDEX, TROPONINI in the last 168 hours. BNP (last 3 results) No results for input(s): PROBNP in the last  8760 hours. HbA1C: No results for input(s): HGBA1C in the last 72 hours. CBG: No results for input(s): GLUCAP in the last 168 hours. Lipid Profile: No results for input(s): CHOL, HDL, LDLCALC, TRIG, CHOLHDL, LDLDIRECT in the last 72 hours. Thyroid Function Tests: No results for input(s): TSH, T4TOTAL, FREET4, T3FREE, THYROIDAB in the last 72 hours. Anemia Panel: Recent Labs    08/23/20 0445  VITAMINB12 279  FOLATE 9.1  TIBC 393  IRON 53    Sepsis Labs: No results for input(s): PROCALCITON, LATICACIDVEN in the last 168 hours.  Recent Results (from the past 240 hour(s))  Resp Panel by RT-PCR (Flu A&B, Covid) Nasopharyngeal Swab     Status: None   Collection Time: 08/22/20 11:50 PM   Specimen: Nasopharyngeal Swab; Nasopharyngeal(NP) swabs in vial transport medium  Result Value Ref Range Status   SARS Coronavirus 2 by RT PCR NEGATIVE NEGATIVE Final    Comment: (NOTE) SARS-CoV-2 target nucleic acids are NOT DETECTED.  The SARS-CoV-2 RNA is generally detectable in upper respiratory specimens during the acute phase of infection. The lowest concentration of SARS-CoV-2 viral copies this assay can detect is 138 copies/mL. A negative result does not preclude SARS-Cov-2 infection and should not be used as the sole basis for treatment or other patient management decisions. A negative result may occur with  improper specimen collection/handling, submission of specimen other than nasopharyngeal swab, presence of viral mutation(s)  within the areas targeted by this assay, and inadequate number of viral copies(<138 copies/mL). A negative result must be combined with clinical observations, patient history, and epidemiological information. The expected result is Negative.  Fact Sheet for Patients:  EntrepreneurPulse.com.au  Fact Sheet for Healthcare Providers:  IncredibleEmployment.be  This test is no t yet approved or cleared by the [place] FDA and  has been authorized for detection and/or diagnosis of SARS-CoV-2 by FDA under an Emergency Use Authorization (EUA). This EUA will remain  in effect (meaning this test can be used) for the duration of the COVID-19 declaration under Section 564(b)(1) of the Act, 21 U.S.C.section 360bbb-3(b)(1), unless the authorization is terminated  or revoked sooner.       Influenza A by PCR NEGATIVE NEGATIVE Final   Influenza B by PCR NEGATIVE NEGATIVE Final    Comment: (NOTE) The Xpert Xpress SARS-CoV-2/FLU/RSV plus assay is intended as an aid in the diagnosis of influenza from Nasopharyngeal swab specimens and should not be used as a sole basis for treatment. Nasal washings and aspirates are unacceptable for Xpert Xpress SARS-CoV-2/FLU/RSV testing.  Fact Sheet for Patients: EntrepreneurPulse.com.au  Fact Sheet for Healthcare Providers: IncredibleEmployment.be  This test is not yet approved or cleared by the [place] FDA and has been authorized for detection and/or diagnosis of SARS-CoV-2 by FDA under an Emergency Use Authorization (EUA). This EUA will remain in effect (meaning this test can be used) for the duration of the COVID-19 declaration under Section 564(b)(1) of the Act, 21 U.S.C. section 360bbb-3(b)(1), unless the authorization is terminated or revoked.  Performed at [facility], [place] [address], [place], [place] [address]        Radiology Studies: No  results found.    Scheduled Meds:  loratadine  10 mg Oral Daily   pantoprazole (PROTONIX) IV  40 mg Intravenous Q12H   peg 3350 powder  0.5 kit Oral Once   Continuous Infusions:   LOS: 1 day      Time spent: 20 minutes   Dessa Phi, DO [facility] 08/24/2020, 12:58 PM  Available via Epic secure chat 7am-7pm After these hours, please refer to coverage provider listed on [email or web address]

## 2020-08-24 NOTE — Progress Notes (Signed)
Notified on call provider that patient vomited after taking MoviPrep. Gave patient's scheduled IV Protonix.

## 2020-08-24 NOTE — Progress Notes (Signed)
[facility] Progress Note  [name] 60 y.o. Apr 10, 1960  CC:  Anemia, hemoccult positive stool   Subjective: Did movie prep yesterday split dose with full liquids. Some answers from his sister [name], patient able to communicate with some signals. This morning had soft brown stool about 8:00. Patient denies abdominal pain.  Had 1 episode of clear liquid vomiting after movi prep yesterday at the very end.  Otherwise no nausea and vomiting. Hemoglobin remaining stable at 8.1  ROS : Review of Systems  Constitutional:  Negative for chills and fever.  Respiratory:  Negative for shortness of breath.   Cardiovascular:  Negative for chest pain and leg swelling.  Gastrointestinal:  Negative for abdominal pain, blood in stool, melena, nausea and vomiting.  Musculoskeletal:  Negative for falls.  Neurological:  Negative for loss of consciousness.     Objective: Vital signs in last 24 hours: Vitals:   08/23/20 2123 08/24/20 0439  BP: (!) 121/95 115/73  Pulse: 87 94  Resp: 18 16  Temp: 98.2 F (36.8 C) 98.6 F (37 C)  SpO2: 96% 96%    Physical Exam: Physical Exam Pulmonary:     Effort: Pulmonary effort is normal.  Abdominal:     General: Bowel sounds are increased. There is no distension.     Palpations: Abdomen is soft.     Tenderness: There is no abdominal tenderness.  Musculoskeletal:     Comments: Lower leg atrophy  Skin:    General: Skin is warm.  Neurological:     Mental Status: He is alert. Mental status is at baseline.     Comments: Bilateral extremity contraction  Psychiatric:        Mood and Affect: Mood normal.     Lab Results: Recent Labs    08/22/20 1804  NA 137  K 4.1  CL 105  CO2 26  GLUCOSE 97  BUN 21*  CREATININE 0.80  CALCIUM 8.3*   Recent Labs    08/22/20 1804  AST 19  ALT 23  ALKPHOS 86  BILITOT 0.3  PROT 6.6  ALBUMIN 3.3*   Recent Labs    08/23/20 0445 08/24/20 0410  WBC 6.6 6.7  HGB 8.1* 8.1*  HCT 28.1* 26.9*   MCV 79.8* 80.8  PLT 508* 482*   No results for input(s): LABPROT, INR in the last 72 hours.    Studies/Results: No results found.  Assessment/Plan: Anemia and Hemoccult positive in patient with cerebral palsy, bedbound. Will begin second day of prep today since patient had soft brown stool this morning. With hope of possible colonoscopy tomorrow or Sunday. Continue to monitor H&H with transfusion as needed to maintain hemoglobin greater than 7.    Doree Albee PA-C 08/24/2020, 9:20 AM  Contact #  [phone]

## 2020-08-24 NOTE — Plan of Care (Signed)

## 2020-08-25 LAB — CBC
HCT: 26.8 % — ABNORMAL LOW (ref 39.0–52.0)
Hemoglobin: 7.7 g/dL — ABNORMAL LOW (ref 13.0–17.0)
MCH: 23.3 pg — ABNORMAL LOW (ref 26.0–34.0)
MCHC: 28.7 g/dL — ABNORMAL LOW (ref 30.0–36.0)
MCV: 81.2 fL (ref 80.0–100.0)
Platelets: 420 10*3/uL — ABNORMAL HIGH (ref 150–400)
RBC: 3.3 MIL/uL — ABNORMAL LOW (ref 4.22–5.81)
RDW: 17.1 % — ABNORMAL HIGH (ref 11.5–15.5)
WBC: 5.9 10*3/uL (ref 4.0–10.5)
nRBC: 0 % (ref 0.0–0.2)

## 2020-08-25 MED ORDER — PEG-KCL-NACL-NASULF-NA ASC-C 100 G PO SOLR
0.5000 | Freq: Once | ORAL | Status: AC
  Administered 2020-08-25: 100 g via ORAL
  Filled 2020-08-25: qty 1

## 2020-08-25 MED ORDER — PANTOPRAZOLE SODIUM 40 MG IV SOLR
40.0000 mg | INTRAVENOUS | Status: DC
  Administered 2020-08-25: 40 mg via INTRAVENOUS
  Filled 2020-08-25: qty 40

## 2020-08-25 MED ORDER — PEG-KCL-NACL-NASULF-NA ASC-C 100 G PO SOLR
0.5000 | Freq: Once | ORAL | Status: AC
  Administered 2020-08-25: 100 g via ORAL

## 2020-08-25 MED ORDER — SODIUM CHLORIDE 0.9 % IV SOLN: INTRAVENOUS | Status: DC

## 2020-08-25 NOTE — Progress Notes (Signed)
[name] 9:43 AM  Subjective: Patient seen and examined and case discussed with the patient's sister power of attorney as well as my partner Dr. Dulce Sellar and she does not feel he is quite clean enough and has not had a bowel movement today and his stools are still very dark and she thinks we are better to hold off for tomorrow and constipation is a usual problem of his and he only complains of pain when he is constipated and he has no other complaints  Objective: Vital signs stable afebrile no acute distress abdomen is soft nontender hemoglobin minimal drop from 8.1-7.7  Assessment: Guaiac positive anemia  Plan: Will proceed with colonoscopy and endoscopy tomorrow and continue prep today and the sister agrees with that plan  [facility]  office [phone] After 5PM or if no answer call [phone]

## 2020-08-25 NOTE — Progress Notes (Signed)
PROGRESS NOTE    [name]  GXQ:[id number] DOB: 1960/12/21 DOA: 08/22/2020 PCP: Caren Macadam, MD     Brief Narrative:  [name] is a 60 y.o. male with medical history significant for cerebral palsy-nonverbal and wheelchair-bound at baseline, pressure ulcer wound POA, history of right nephrectomy who presents with sister's concerns of increasing lethargy.  Sister reports that for the past months he has had on and off lethargy.  There will be days where he will sleep more than usual.  States normally he is very interactive and "bubbly."  Also had lower appetite. Normally he is able to drink out of a straw on his own but lately has needed more help. She presented to his PCP with these symptoms and he was found to have hemoglobin of 6 and asked to present to the ED. She denies seeing any bright red blood per rectum or dark stool.  He has never had a colonoscopy.  He does not take aspirin or any NSAIDs over-the-counter.  Patient was admitted for GI bleeding.  GI was consulted.  New events last 24 hours / Subjective: No issues overnight, patient remains nonverbal which is his baseline.  Continues on bowel prep today, endoscopy tomorrow  Assessment & Plan:   Principal Problem:   Acute anemia Active Problems:   Cellulitis   Pressure ulcer   Cerebral palsy (HCC)   GI bleed   Acute blood loss anemia, GI bleed -Status post 1 unit packed red blood cell 6/29 -FOBT positive -GI consulted, planning for endoscopy tentatively tomorrow  -Remains on clear liquid diet -IV PPI  -Trend CBC, hemoglobin remains stable  Left eye irritation -Sister states that patient has had waxing and waning irritation and swelling with drainage of the left eye over the past 1 to 2 months.  He will wake up with some crusting of the left eye, and she will clean it, then the swelling and drainage will continue to improve spontaneously without any treatment.  Unknown if there are any exacerbating factors. -On  examination, there is no drainage or crusting, eyelid is dry without significant erythema.  There is some scabbing noted on the upper eyelid.  Does not look actively infected -Improved  Cerebral palsy -Patient is nonverbal and wheelchair-bound at baseline.  Lives at home with sister who is his caregiver   In agreement with assessment of the pressure ulcer as below:  Pressure Injury 08/22/20 Hip Right Stage 3 -  Full thickness tissue loss. Subcutaneous fat may be visible but bone, tendon or muscle are NOT exposed. (Active)  08/22/20   Location: Hip  Location Orientation: Right  Staging: Stage 3 -  Full thickness tissue loss. Subcutaneous fat may be visible but bone, tendon or muscle are NOT exposed.  Wound Description (Comments):   Present on Admission: Yes         DVT prophylaxis:  SCDs Start: 08/22/20 2304  Code Status:     Code Status Orders  (From admission, onward)           Start     Ordered   08/22/20 2318  Do not attempt resuscitation (DNR)  Continuous       Question Answer Comment  In the event of cardiac or respiratory ARREST Do not call a "code blue"   In the event of cardiac or respiratory ARREST Do not perform Intubation, CPR, defibrillation or ACLS   In the event of cardiac or respiratory ARREST Use medication by any route, position, wound care, and  other measures to relive pain and suffering. May use oxygen, suction and manual treatment of airway obstruction as needed for comfort.      08/22/20 2317           Code Status History     Date Active Date Inactive Code Status Order ID Comments User Context   08/22/2020 2307 08/22/2020 2317 Full Code [id number]  Orene Desanctis, DO ED   02/24/2019 2000 03/02/2019 2152 Full Code [id number]  Lattie Corns Inpatient   01/03/2019 1204 01/06/2019 1836 Full Code [id number]  McKenzie, Candee Furbish, MD Inpatient      Family Communication: Sister at bedside Disposition Plan:  Status is: Inpatient  Remains inpatient  appropriate because:Inpatient level of care appropriate due to severity of illness  Dispo: The patient is from: Home              Anticipated d/c is to: Home              Patient currently is not medically stable to d/c.   Difficult to place patient Yes      Consultants:  GI  Procedures:  None   Antimicrobials:  Anti-infectives (From admission, onward)    Start     Dose/Rate Route Frequency Ordered Stop   08/23/20 0000  cephALEXin (KEFLEX) capsule 250 mg  Status:  Discontinued        250 mg Oral Every 6 hours 08/22/20 2316 08/23/20 1225        Objective: Vitals:   08/24/20 0439 08/24/20 1304 08/24/20 2037 08/25/20 0430  BP: 115/73 108/86 136/87 106/72  Pulse: 94 88 77 80  Resp: 16 16 16 16   Temp: 98.6 F (37 C) 98.4 F (36.9 C) (!) 97.5 F (36.4 C) 98.2 F (36.8 C)  TempSrc: Oral Oral  Oral  SpO2: 96% 96% 98% 94%  Weight:      Height:        Intake/Output Summary (Last 24 hours) at 08/25/2020 1145 Last data filed at 08/24/2020 1400 Gross per 24 hour  Intake 260 ml  Output --  Net 260 ml    Filed Weights   08/23/20 0130  Weight: 58.2 kg   Examination: General exam: Appears calm and comfortable  Respiratory system: Clear to auscultation. Respiratory effort normal. Cardiovascular system: S1 & S2 heard, RRR. No pedal edema. Gastrointestinal system: Abdomen is nondistended, soft  Central nervous system: Alert  Extremities: Contractures of extremities  Data Reviewed: I have personally reviewed following labs and imaging studies  CBC: Recent Labs  Lab 08/22/20 1804 08/23/20 0445 08/24/20 0410 08/25/20 0520  WBC 8.1 6.6 6.7 5.9  HGB 7.2* 8.1* 8.1* 7.7*  HCT 26.0* 28.1* 26.9* 26.8*  MCV 78.1* 79.8* 80.8 81.2  PLT 612* 508* 482* 420*    Basic Metabolic Panel: Recent Labs  Lab 08/22/20 1804  NA 137  K 4.1  CL 105  CO2 26  GLUCOSE 97  BUN 21*  CREATININE 0.80  CALCIUM 8.3*    GFR: Estimated Creatinine Clearance: 82.9 mL/min (by C-G  formula based on SCr of 0.8 mg/dL). Liver Function Tests: Recent Labs  Lab 08/22/20 1804  AST 19  ALT 23  ALKPHOS 86  BILITOT 0.3  PROT 6.6  ALBUMIN 3.3*    No results for input(s): LIPASE, AMYLASE in the last 168 hours. No results for input(s): AMMONIA in the last 168 hours. Coagulation Profile: No results for input(s): INR, PROTIME in the last 168 hours. Cardiac Enzymes: No results for input(s): CKTOTAL, CKMB,  CKMBINDEX, TROPONINI in the last 168 hours. BNP (last 3 results) No results for input(s): PROBNP in the last 8760 hours. HbA1C: No results for input(s): HGBA1C in the last 72 hours. CBG: No results for input(s): GLUCAP in the last 168 hours. Lipid Profile: No results for input(s): CHOL, HDL, LDLCALC, TRIG, CHOLHDL, LDLDIRECT in the last 72 hours. Thyroid Function Tests: No results for input(s): TSH, T4TOTAL, FREET4, T3FREE, THYROIDAB in the last 72 hours. Anemia Panel: Recent Labs    08/23/20 0445  VITAMINB12 279  FOLATE 9.1  TIBC 393  IRON 53    Sepsis Labs: No results for input(s): PROCALCITON, LATICACIDVEN in the last 168 hours.  Recent Results (from the past 240 hour(s))  Resp Panel by RT-PCR (Flu A&B, Covid) Nasopharyngeal Swab     Status: None   Collection Time: 08/22/20 11:50 PM   Specimen: Nasopharyngeal Swab; Nasopharyngeal(NP) swabs in vial transport medium  Result Value Ref Range Status   SARS Coronavirus 2 by RT PCR NEGATIVE NEGATIVE Final    Comment: (NOTE) SARS-CoV-2 target nucleic acids are NOT DETECTED.  The SARS-CoV-2 RNA is generally detectable in upper respiratory specimens during the acute phase of infection. The lowest concentration of SARS-CoV-2 viral copies this assay can detect is 138 copies/mL. A negative result does not preclude SARS-Cov-2 infection and should not be used as the sole basis for treatment or other patient management decisions. A negative result may occur with  improper specimen collection/handling, submission of  specimen other than nasopharyngeal swab, presence of viral mutation(s) within the areas targeted by this assay, and inadequate number of viral copies(<138 copies/mL). A negative result must be combined with clinical observations, patient history, and epidemiological information. The expected result is Negative.  Fact Sheet for Patients:  EntrepreneurPulse.com.au  Fact Sheet for Healthcare Providers:  IncredibleEmployment.be  This test is no t yet approved or cleared by the [place] FDA and  has been authorized for detection and/or diagnosis of SARS-CoV-2 by FDA under an Emergency Use Authorization (EUA). This EUA will remain  in effect (meaning this test can be used) for the duration of the COVID-19 declaration under Section 564(b)(1) of the Act, 21 U.S.C.section 360bbb-3(b)(1), unless the authorization is terminated  or revoked sooner.       Influenza A by PCR NEGATIVE NEGATIVE Final   Influenza B by PCR NEGATIVE NEGATIVE Final    Comment: (NOTE) The Xpert Xpress SARS-CoV-2/FLU/RSV plus assay is intended as an aid in the diagnosis of influenza from Nasopharyngeal swab specimens and should not be used as a sole basis for treatment. Nasal washings and aspirates are unacceptable for Xpert Xpress SARS-CoV-2/FLU/RSV testing.  Fact Sheet for Patients: EntrepreneurPulse.com.au  Fact Sheet for Healthcare Providers: IncredibleEmployment.be  This test is not yet approved or cleared by the [place] FDA and has been authorized for detection and/or diagnosis of SARS-CoV-2 by FDA under an Emergency Use Authorization (EUA). This EUA will remain in effect (meaning this test can be used) for the duration of the COVID-19 declaration under Section 564(b)(1) of the Act, 21 U.S.C. section 360bbb-3(b)(1), unless the authorization is terminated or revoked.  Performed at [facility], [place]  [address], [place], [place] [address]        Radiology Studies: No results found.    Scheduled Meds:  loratadine  10 mg Oral Daily   pantoprazole (PROTONIX) IV  40 mg Intravenous Q24H   peg 3350 powder  0.5 kit Oral Once   And   peg 3350 powder  0.5 kit Oral Once   Continuous Infusions:   LOS: 2 days      Time spent: 20 minutes   Dessa Phi, DO [facility] 08/25/2020, 11:45 AM   Available via Epic secure chat 7am-7pm After these hours, please refer to coverage provider listed on [email or web address]

## 2020-08-26 ENCOUNTER — Inpatient Hospital Stay (HOSPITAL_COMMUNITY): Payer: Medicare Other | Admitting: Certified Registered"

## 2020-08-26 ENCOUNTER — Encounter (HOSPITAL_COMMUNITY): Payer: Self-pay | Admitting: Internal Medicine

## 2020-08-26 ENCOUNTER — Encounter (HOSPITAL_COMMUNITY)
Admission: EM | Disposition: A | Discharge status: Home / Self Care | Payer: Self-pay | Source: Home / Self Care | Attending: Internal Medicine

## 2020-08-26 DIAGNOSIS — K921 Melena: Principal | ICD-10-CM

## 2020-08-26 HISTORY — PX: COLONOSCOPY WITH PROPOFOL: SHX5780

## 2020-08-26 HISTORY — PX: ESOPHAGOGASTRODUODENOSCOPY (EGD) WITH PROPOFOL: SHX5813

## 2020-08-26 LAB — CBC
HCT: 28.6 % — ABNORMAL LOW (ref 39.0–52.0)
Hemoglobin: 8.2 g/dL — ABNORMAL LOW (ref 13.0–17.0)
MCH: 23.2 pg — ABNORMAL LOW (ref 26.0–34.0)
MCHC: 28.7 g/dL — ABNORMAL LOW (ref 30.0–36.0)
MCV: 80.8 fL (ref 80.0–100.0)
Platelets: 465 10*3/uL — ABNORMAL HIGH (ref 150–400)
RBC: 3.54 MIL/uL — ABNORMAL LOW (ref 4.22–5.81)
RDW: 17.5 % — ABNORMAL HIGH (ref 11.5–15.5)
WBC: 9.4 10*3/uL (ref 4.0–10.5)
nRBC: 0 % (ref 0.0–0.2)

## 2020-08-26 SURGERY — ESOPHAGOGASTRODUODENOSCOPY (EGD) WITH PROPOFOL
Anesthesia: Monitor Anesthesia Care

## 2020-08-26 MED ORDER — LACTATED RINGERS IV SOLN: INTRAVENOUS | Status: DC | PRN

## 2020-08-26 MED ORDER — PROPOFOL 10 MG/ML IV BOLUS
INTRAVENOUS | Status: DC | PRN
  Administered 2020-08-26 (×2): 20 mg via INTRAVENOUS

## 2020-08-26 MED ORDER — ONDANSETRON HCL 4 MG/2ML IJ SOLN: 4.0000 mg | Freq: Once | INTRAMUSCULAR | Status: DC | PRN

## 2020-08-26 MED ORDER — PROPOFOL 500 MG/50ML IV EMUL
INTRAVENOUS | Status: DC | PRN
  Administered 2020-08-26: 125 ug/kg/min via INTRAVENOUS

## 2020-08-26 MED ORDER — OXYCODONE HCL 5 MG/5ML PO SOLN
5.0000 mg | Freq: Once | ORAL | Status: DC | PRN
  Filled 2020-08-26: qty 5

## 2020-08-26 MED ORDER — OXYCODONE HCL 5 MG PO TABS: 5.0000 mg | ORAL_TABLET | Freq: Once | ORAL | Status: DC | PRN

## 2020-08-26 MED ORDER — LIDOCAINE 2% (20 MG/ML) 5 ML SYRINGE
INTRAMUSCULAR | Status: DC | PRN
  Administered 2020-08-26: 60 mg via INTRAVENOUS

## 2020-08-26 MED ORDER — FENTANYL CITRATE (PF) 100 MCG/2ML IJ SOLN: 25.0000 ug | INTRAMUSCULAR | Status: DC | PRN

## 2020-08-26 MED ORDER — PROPOFOL 500 MG/50ML IV EMUL
INTRAVENOUS | Status: AC
  Filled 2020-08-26: qty 50

## 2020-08-26 MED ORDER — EPHEDRINE SULFATE-NACL 50-0.9 MG/10ML-% IV SOSY
PREFILLED_SYRINGE | INTRAVENOUS | Status: DC | PRN
  Administered 2020-08-26 (×2): 10 mg via INTRAVENOUS

## 2020-08-26 MED ORDER — AMISULPRIDE (ANTIEMETIC) 5 MG/2ML IV SOLN
10.0000 mg | Freq: Once | INTRAVENOUS | Status: DC | PRN
  Filled 2020-08-26: qty 4

## 2020-08-26 SURGICAL SUPPLY — 24 items

## 2020-08-26 NOTE — Discharge Summary (Signed)
Physician Discharge Summary  Brett KenningRobert L Plott WUX:[id number] DOB: 02/05/1961 DOA: 08/22/2020  PCP: Aliene BeamsHagler, Rachel, MD  Admit date: 08/22/2020 Discharge date: 08/26/2020  Admitted From: Home Disposition: Home  Recommendations for Outpatient Follow-up:  Follow up with PCP in 1 week Follow up with [facility] Repeat CBC in 1 week  Discharge Condition: Stable CODE STATUS: DNR Diet recommendation: Regular diet  Brief/Interim Summary: [name] is a 60 y.o. male with medical history significant for cerebral palsy-nonverbal and wheelchair-bound at baseline, pressure ulcer wound POA, history of right nephrectomy who presents with sister's concerns of increasing lethargy.   Sister reports that for the past months he has had on and off lethargy.  There will be days where he will sleep more than usual.  States normally he is very interactive and "bubbly."  Also had lower appetite. Normally he is able to drink out of a straw on his own but lately has needed more help. She presented to his PCP with these symptoms and he was found to have hemoglobin of 6 and asked to present to the ED. She denies seeing any bright red blood per rectum or dark stool.  He has never had a colonoscopy.  He does not take aspirin or any NSAIDs over-the-counter.  Patient was admitted for GI bleeding.  GI was consulted.  Patient underwent EGD and colonoscopy on 7/3 which were unremarkable for etiology for blood loss.  Patient's hemoglobin remained stable.  Discharge Diagnoses:  Principal Problem:   GI bleed Active Problems:   Acute anemia   Pressure ulcer   Cerebral palsy (HCC)   Acute blood loss anemia, GI bleed -Status post 1 unit packed red blood cell 6/29 -FOBT positive -EGD, colonoscopy results as below -Hemoglobin remains stable -Follow-up with GI as needed  Left eye irritation -Sister states that patient has had waxing and waning irritation and swelling with drainage of the left eye over the past 1 to 2 months.   He will wake up with some crusting of the left eye, and she will clean it, then the swelling and drainage will continue to improve spontaneously without any treatment.  Unknown if there are any exacerbating factors. -On examination, there is no drainage or crusting, eyelid is dry without significant erythema.  There is some scabbing noted on the upper eyelid.  Does not look actively infected.  Cellulitis ruled out -Improved  Cerebral palsy -Patient is nonverbal and wheelchair-bound at baseline.  Lives at home with sister who is his caregiver       In agreement with assessment of the pressure ulcer as below:  Pressure Injury 08/22/20 Hip Right Stage 3 -  Full thickness tissue loss. Subcutaneous fat may be visible but bone, tendon or muscle are NOT exposed. (Active)  08/22/20   Location: Hip  Location Orientation: Right  Staging: Stage 3 -  Full thickness tissue loss. Subcutaneous fat may be visible but bone, tendon or muscle are NOT exposed.  Wound Description (Comments):   Present on Admission: Yes       Discharge Instructions  Discharge Instructions     Call MD for:  difficulty breathing, headache or visual disturbances   Complete by: As directed    Call MD for:  extreme fatigue   Complete by: As directed    Call MD for:  persistant dizziness or light-headedness   Complete by: As directed    Call MD for:  persistant nausea and vomiting   Complete by: As directed    Call MD  for:  severe uncontrolled pain   Complete by: As directed    Call MD for:  temperature >100.4   Complete by: As directed    Diet - low sodium heart healthy   Complete by: As directed    Discharge instructions   Complete by: As directed    You were cared for by a hospitalist during your hospital stay. If you have any questions about your discharge medications or the care you received while you were in the hospital after you are discharged, you can call the unit and ask to speak with the hospitalist on  call if the hospitalist that took care of you is not available. Once you are discharged, your primary care physician will handle any further medical issues. Please note that NO REFILLS for any discharge medications will be authorized once you are discharged, as it is imperative that you return to your primary care physician (or establish a relationship with a primary care physician if you do not have one) for your aftercare needs so that they can reassess your need for medications and monitor your lab values.   Increase activity slowly   Complete by: As directed    No wound care   Complete by: As directed       Allergies as of 08/26/2020   No Known Allergies      Medication List     STOP taking these medications    erythromycin ophthalmic ointment   oxyCODONE-acetaminophen 5-325 MG tablet Commonly known as: PERCOCET/ROXICET   sulfamethoxazole-trimethoprim 800-160 MG tablet Commonly known as: BACTRIM DS       TAKE these medications    acetaminophen 325 MG tablet Commonly known as: TYLENOL Take 325 mg by mouth every 6 (six) hours as needed for moderate pain or headache.   cetirizine 10 MG tablet Commonly known as: ZYRTEC Take 10 mg by mouth daily.   liver oil-zinc oxide 40 % ointment Commonly known as: DESITIN Apply 1 application topically daily as needed for irritation.        Follow-up Information     Aliene Beams, MD. Schedule an appointment as soon as possible for a visit in 1 week(s).   Specialty: Family Medicine Contact information: [address] [place] [address] [phone]         Gastroenterology, Deboraha Sprang Follow up.   Why: If symptoms worsen, As needed Contact information: [address] [place] [address] [phone]                No Known Allergies  Consultations: GI   Procedures/Studies: No results found. Colonoscopy Impression:               - Stool in the rectum and in the sigmoid colon.                            - The descending colon, transverse colon, ascending                           colon and cecum are normal.                           - The examined portion of the ileum was normal.                           -  The examination was otherwise normal.                           - No specimens collected. EGD Impression:               - Normal larynx.                           - Small hiatal hernia.                           - Normal stomach.                           - Normal duodenal bulb, first portion of the                           duodenum, second portion of the duodenum and third                           portion of the duodenum.                           - The examination was otherwise normal.                           - No specimens collected.     Discharge Exam: Vitals:   08/26/20 0945 08/26/20 1003  BP: 123/86 117/78  Pulse: 65 79  Resp: 12 16  Temp: 97.6 F (36.4 C) 97.7 F (36.5 C)  SpO2: 97% 98%     General: Pt is alert, awake, not in acute distress Cardiovascular: RR, no edema Respiratory: On room air, no respiratory distress Abdominal: Soft, NT, ND Extremities: no edema, no cyanosis, contractures of the extremities consistent with cerebral palsy Psych: Stable   The results of significant diagnostics from this hospitalization (including imaging, microbiology, ancillary and laboratory) are listed below for reference.     Microbiology: Recent Results (from the past 240 hour(s))  Resp Panel by RT-PCR (Flu A&B, Covid) Nasopharyngeal Swab     Status: None   Collection Time: 08/22/20 11:50 PM   Specimen: Nasopharyngeal Swab; Nasopharyngeal(NP) swabs in vial transport medium  Result Value Ref Range Status   SARS Coronavirus 2 by RT PCR NEGATIVE NEGATIVE Final    Comment: (NOTE) SARS-CoV-2 target nucleic acids are NOT DETECTED.  The SARS-CoV-2 RNA is generally detectable in upper respiratory specimens during the acute phase of infection. The  lowest concentration of SARS-CoV-2 viral copies this assay can detect is 138 copies/mL. A negative result does not preclude SARS-Cov-2 infection and should not be used as the sole basis for treatment or other patient management decisions. A negative result may occur with  improper specimen collection/handling, submission of specimen other than nasopharyngeal swab, presence of viral mutation(s) within the areas targeted by this assay, and inadequate number of viral copies(<138 copies/mL). A negative result must be combined with clinical observations, patient history, and epidemiological information. The expected result is Negative.  Fact Sheet for Patients:  BloggerCourse.com  Fact Sheet for Healthcare Providers:  SeriousBroker.it  This test is no t yet approved or cleared by the [place] FDA and  has been authorized for detection and/or diagnosis  of SARS-CoV-2 by FDA under an Emergency Use Authorization (EUA). This EUA will remain  in effect (meaning this test can be used) for the duration of the COVID-19 declaration under Section 564(b)(1) of the Act, 21 U.S.C.section 360bbb-3(b)(1), unless the authorization is terminated  or revoked sooner.       Influenza A by PCR NEGATIVE NEGATIVE Final   Influenza B by PCR NEGATIVE NEGATIVE Final    Comment: (NOTE) The Xpert Xpress SARS-CoV-2/FLU/RSV plus assay is intended as an aid in the diagnosis of influenza from Nasopharyngeal swab specimens and should not be used as a sole basis for treatment. Nasal washings and aspirates are unacceptable for Xpert Xpress SARS-CoV-2/FLU/RSV testing.  Fact Sheet for Patients: BloggerCourse.com  Fact Sheet for Healthcare Providers: SeriousBroker.it  This test is not yet approved or cleared by the [place] FDA and has been authorized for detection and/or diagnosis of SARS-CoV-2 by FDA under  an Emergency Use Authorization (EUA). This EUA will remain in effect (meaning this test can be used) for the duration of the COVID-19 declaration under Section 564(b)(1) of the Act, 21 U.S.C. section 360bbb-3(b)(1), unless the authorization is terminated or revoked.  Performed at [facility], [address] [place], [place] [address]      Labs: BNP (last 3 results) No results for input(s): BNP in the last 8760 hours. Basic Metabolic Panel: Recent Labs  Lab 08/22/20 1804  NA 137  K 4.1  CL 105  CO2 26  GLUCOSE 97  BUN 21*  CREATININE 0.80  CALCIUM 8.3*   Liver Function Tests: Recent Labs  Lab 08/22/20 1804  AST 19  ALT 23  ALKPHOS 86  BILITOT 0.3  PROT 6.6  ALBUMIN 3.3*   No results for input(s): LIPASE, AMYLASE in the last 168 hours. No results for input(s): AMMONIA in the last 168 hours. CBC: Recent Labs  Lab 08/22/20 1804 08/23/20 0445 08/24/20 0410 08/25/20 0520 08/26/20 0420  WBC 8.1 6.6 6.7 5.9 9.4  HGB 7.2* 8.1* 8.1* 7.7* 8.2*  HCT 26.0* 28.1* 26.9* 26.8* 28.6*  MCV 78.1* 79.8* 80.8 81.2 80.8  PLT 612* 508* 482* 420* 465*   Cardiac Enzymes: No results for input(s): CKTOTAL, CKMB, CKMBINDEX, TROPONINI in the last 168 hours. BNP: Invalid input(s): POCBNP CBG: No results for input(s): GLUCAP in the last 168 hours. D-Dimer No results for input(s): DDIMER in the last 72 hours. Hgb A1c No results for input(s): HGBA1C in the last 72 hours. Lipid Profile No results for input(s): CHOL, HDL, LDLCALC, TRIG, CHOLHDL, LDLDIRECT in the last 72 hours. Thyroid function studies No results for input(s): TSH, T4TOTAL, T3FREE, THYROIDAB in the last 72 hours.  Invalid input(s): FREET3 Anemia work up No results for input(s): VITAMINB12, FOLATE, FERRITIN, TIBC, IRON, RETICCTPCT in the last 72 hours. Urinalysis    Component Value Date/Time   COLORURINE YELLOW 11/24/2014 1630   APPEARANCEUR CLEAR 11/24/2014 1630   LABSPEC 1.015 11/24/2014  1630   PHURINE 7.5 11/24/2014 1630   GLUCOSEU NEGATIVE 11/24/2014 1630   HGBUR LARGE (A) 11/24/2014 1630   BILIRUBINUR NEGATIVE 11/24/2014 1630   KETONESUR NEGATIVE 11/24/2014 1630   PROTEINUR 30 (A) 11/24/2014 1630   UROBILINOGEN 0.2 11/24/2014 1630   NITRITE POSITIVE (A) 11/24/2014 1630   LEUKOCYTESUR LARGE (A) 11/24/2014 1630   Sepsis Labs Invalid input(s): PROCALCITONIN,  WBC,  LACTICIDVEN Microbiology Recent Results (from the past 240 hour(s))  Resp Panel by RT-PCR (Flu A&B, Covid) Nasopharyngeal Swab     Status: None   Collection  Time: 08/22/20 11:50 PM   Specimen: Nasopharyngeal Swab; Nasopharyngeal(NP) swabs in vial transport medium  Result Value Ref Range Status   SARS Coronavirus 2 by RT PCR NEGATIVE NEGATIVE Final    Comment: (NOTE) SARS-CoV-2 target nucleic acids are NOT DETECTED.  The SARS-CoV-2 RNA is generally detectable in upper respiratory specimens during the acute phase of infection. The lowest concentration of SARS-CoV-2 viral copies this assay can detect is 138 copies/mL. A negative result does not preclude SARS-Cov-2 infection and should not be used as the sole basis for treatment or other patient management decisions. A negative result may occur with  improper specimen collection/handling, submission of specimen other than nasopharyngeal swab, presence of viral mutation(s) within the areas targeted by this assay, and inadequate number of viral copies(<138 copies/mL). A negative result must be combined with clinical observations, patient history, and epidemiological information. The expected result is Negative.  Fact Sheet for Patients:  BloggerCourse.com  Fact Sheet for Healthcare Providers:  SeriousBroker.it  This test is no t yet approved or cleared by the [place] FDA and  has been authorized for detection and/or diagnosis of SARS-CoV-2 by FDA under an Emergency Use Authorization (EUA). This  EUA will remain  in effect (meaning this test can be used) for the duration of the COVID-19 declaration under Section 564(b)(1) of the Act, 21 U.S.C.section 360bbb-3(b)(1), unless the authorization is terminated  or revoked sooner.       Influenza A by PCR NEGATIVE NEGATIVE Final   Influenza B by PCR NEGATIVE NEGATIVE Final    Comment: (NOTE) The Xpert Xpress SARS-CoV-2/FLU/RSV plus assay is intended as an aid in the diagnosis of influenza from Nasopharyngeal swab specimens and should not be used as a sole basis for treatment. Nasal washings and aspirates are unacceptable for Xpert Xpress SARS-CoV-2/FLU/RSV testing.  Fact Sheet for Patients: BloggerCourse.com  Fact Sheet for Healthcare Providers: SeriousBroker.it  This test is not yet approved or cleared by the [place] FDA and has been authorized for detection and/or diagnosis of SARS-CoV-2 by FDA under an Emergency Use Authorization (EUA). This EUA will remain in effect (meaning this test can be used) for the duration of the COVID-19 declaration under Section 564(b)(1) of the Act, 21 U.S.C. section 360bbb-3(b)(1), unless the authorization is terminated or revoked.  Performed at [facility], [address], [place], [place] [address]      Patient was seen and examined on the day of discharge and was found to be in stable condition. Time coordinating discharge: 25 minutes including assessment and coordination of care, as well as examination of the patient.   SIGNED:  Noralee Stain, DO [facility] 08/26/2020, 11:55 AM

## 2020-08-26 NOTE — Progress Notes (Signed)
No change from am assessment. Pt remains stable. Discharge instructions reviewed with patients sister. Questions, concerns denied.

## 2020-08-26 NOTE — Anesthesia Preprocedure Evaluation (Signed)
Anesthesia Evaluation  Patient identified by MRN, date of birth, ID band Patient awake    Reviewed: Allergy & Precautions, NPO status , Patient's Chart, lab work & pertinent test results  History of Anesthesia Complications Negative for: history of anesthetic complications  Airway Mallampati: II  TM Distance: >3 FB Neck ROM: Full    Dental  (+) Edentulous Upper, Edentulous Lower   Pulmonary neg pulmonary ROS,    Pulmonary exam normal        Cardiovascular negative cardio ROS Normal cardiovascular exam     Neuro/Psych CP, nonverbal, right hemiplegia, wheelchair bound    GI/Hepatic negative GI ROS, Neg liver ROS,   Endo/Other  negative endocrine ROS  Renal/GU Renal disease (s/p R nephrectomy)  negative genitourinary   Musculoskeletal negative musculoskeletal ROS (+)   Abdominal   Peds  Hematology  (+) anemia ,   Anesthesia Other Findings   Reproductive/Obstetrics                             Anesthesia Physical Anesthesia Plan  ASA: 3  Anesthesia Plan: MAC   Post-op Pain Management:    Induction: Intravenous  PONV Risk Score and Plan: 1 and Propofol infusion, TIVA and Treatment may vary due to age or medical condition  Airway Management Planned: Natural Airway, Nasal Cannula and Simple Face Mask  Additional Equipment: None  Intra-op Plan:   Post-operative Plan:   Informed Consent: I have reviewed the patients History and Physical, chart, labs and discussed the procedure including the risks, benefits and alternatives for the proposed anesthesia with the patient or authorized representative who has indicated his/her understanding and acceptance.   Patient has DNR.  Discussed DNR with power of attorney and Suspend DNR.   Consent reviewed with POA  Plan Discussed with:   Anesthesia Plan Comments:         Anesthesia Quick Evaluation

## 2020-08-26 NOTE — Transfer of Care (Signed)
Immediate Anesthesia Transfer of Care Note  Patient: [name]  Procedure(s) Performed: ESOPHAGOGASTRODUODENOSCOPY (EGD) WITH PROPOFOL COLONOSCOPY WITH PROPOFOL  Patient Location: PACU  Anesthesia Type:MAC  Level of Consciousness: awake, alert  and oriented  Airway & Oxygen Therapy: Patient Spontanous Breathing and Patient connected to face mask oxygen  Post-op Assessment: Report given to RN and Post -op Vital signs reviewed and stable  Post vital signs: Reviewed and stable  Last Vitals:  Vitals Value Taken Time  BP 102/69 08/26/20 0917  Temp 36.6 C 08/26/20 0918  Pulse 91 08/26/20 0919  Resp 17 08/26/20 0919  SpO2 100 % 08/26/20 0919  Vitals shown include unvalidated device data.  Last Pain:  Vitals:   08/26/20 0718  TempSrc: Oral  PainSc:          Complications: No notable events documented.

## 2020-08-26 NOTE — Anesthesia Postprocedure Evaluation (Signed)
Anesthesia Post Note  Patient: [name]  Procedure(s) Performed: ESOPHAGOGASTRODUODENOSCOPY (EGD) WITH PROPOFOL COLONOSCOPY WITH PROPOFOL     Patient location during evaluation: PACU Anesthesia Type: MAC Level of consciousness: awake and alert Pain management: pain level controlled Vital Signs Assessment: post-procedure vital signs reviewed and stable Respiratory status: spontaneous breathing, nonlabored ventilation and respiratory function stable Cardiovascular status: blood pressure returned to baseline and stable Postop Assessment: no apparent nausea or vomiting Anesthetic complications: no   No notable events documented.  Last Vitals:  Vitals:   08/26/20 0920 08/26/20 0930  BP: 102/69 123/87  Pulse: 91 84  Resp: 17 13  Temp:  36.6 C  SpO2: 100% 96%    Last Pain:  Vitals:   08/26/20 0718  TempSrc: Oral  PainSc:                  Lidia Collum

## 2020-08-26 NOTE — Progress Notes (Signed)
[name] 8:13 AM  Subjective: Patient without any new complaints and case discussed with his sister and again he drank the prep and she feels this is as clean as he will be  Objective: Vital signs stable afebrile no acute distress exam please see preassessment evaluation hemoglobin stable  Assessment: Guaiac positive anemia  Plan: Okay to proceed with endoscopy and colonoscopy with anesthesia assistance  [facility] E  office [phone] After 5PM or if no answer call [phone]

## 2020-08-26 NOTE — Op Note (Signed)
[facility] Patient Name: [name] Procedure Date: 08/26/2020 MRN: [id number] Attending MD: Vida Rigger , MD Date of Birth: March 08, 1960 CSN: [id number] Age: 60 Admit Type: Inpatient Procedure:                Colonoscopy Indications:              This is the patient's first colonoscopy, Heme                            positive stool, Iron deficiency anemia Providers:                Vida Rigger, MD, Adolph Pollack, RN, [employer], Technician, [facility], [place], CRNA Referring MD:              Medicines:                Monitored Anesthesia Care Complications:            No immediate complications. Estimated Blood Loss:     Estimated blood loss: none. Procedure:                Pre-Anesthesia Assessment:                           - Prior to the procedure, a History and Physical                            was performed, and patient medications and                            allergies were reviewed. The patient's tolerance of                            previous anesthesia was also reviewed. The risks                            and benefits of the procedure and the sedation                            options and risks were discussed with the patient.                            All questions were answered, and informed consent                            was obtained. Prior Anticoagulants: The patient has                            taken no previous anticoagulant or antiplatelet                            agents. ASA Grade Assessment: II - A patient with  mild systemic disease. After reviewing the risks                            and benefits, the patient was deemed in                            satisfactory condition to undergo the procedure.                           After obtaining informed consent, the colonoscope                            was passed under direct vision. Throughout the                             procedure, the patient's blood pressure, pulse, and                            oxygen saturations were monitored continuously. The                            [id number]-H190DL ([id number] Olympus pediatric colonscope                            was introduced through the anus and advanced to the                            the terminal ileum. The terminal ileum, ileocecal                            valve, appendiceal orifice, and rectum were                            photographed. The colonoscopy was somewhat                            difficult due to poor bowel prep, significant                            looping and a tortuous colon. Successful completion                            of the procedure was aided by applying abdominal                            pressure and lavage and fecal disimpaction x2 both                            at the beginning of the procedure and at the end                            with a moderate amount of stool removed. The  patient tolerated the procedure well. The quality                            of the bowel preparation was adequate. Scope In: 8:37:15 AM Scope Out: 8:57:49 AM Scope Withdrawal Time: 0 hours 6 minutes 55 seconds  Total Procedure Duration: 0 hours 20 minutes 34 seconds  Findings:      A large amount of solid stool was found in the rectum and in the sigmoid       colon, precluding visualization. Lavage of the area was performed using       a moderate amount of sterile water as well as fecal disimpaction as       above, resulting in incomplete clearance with continued poor       visualization.      The descending colon, transverse colon, ascending colon and cecum       appeared normal.      The terminal ileum appeared normal.      The exam was otherwise without abnormality. There was no blood seen       throughout the procedure Impression:               - Stool in the rectum and in the sigmoid colon.                            - The descending colon, transverse colon, ascending                            colon and cecum are normal.                           - The examined portion of the ileum was normal.                           - The examination was otherwise normal.                           - No specimens collected. Moderate Sedation:      Not Applicable - Patient had care per Anesthesia. Recommendation:           - Soft diet today.                           - Continue present medications. Minimize pain                            medicines unsure if he needs them and would use                            twice a day MiraLAX going forward to prevent                            further fecal impactions she clearly had despite a                            3-day prep                           -  Repeat colonoscopy PRN for screening purposes.                           - Return to GI office PRN. If further work-up is                            warranted consider a CT scan next if that has not                            been done                           - Telephone GI clinic if symptomatic PRN. Procedure Code(s):        --- Professional ---                           [phone], Colonoscopy, flexible; diagnostic, including                            collection of specimen(s) by brushing or washing,                            when performed (separate procedure) Diagnosis Code(s):        --- Professional ---                           R19.5, Other fecal abnormalities                           D50.9, Iron deficiency anemia, unspecified CPT copyright 2019 [employer]. All rights reserved. The codes documented in this report are preliminary and upon coder review may  be revised to meet current compliance requirements. Vida Rigger, MD 08/26/2020 9:18:16 AM This report has been signed electronically. Number of Addenda: 0

## 2020-08-26 NOTE — Op Note (Signed)
[facility] Patient Name: [name] Procedure Date: 08/26/2020 MRN: [id number] Attending MD: Vida Rigger , MD Date of Birth: 1961-01-01 CSN: [id number] Age: 60 Admit Type: Inpatient Procedure:                Upper GI endoscopy Indications:              Iron deficiency anemia, Heme positive stool Providers:                Vida Rigger, MD, Adolph Pollack, RN, [employer], Technician, [facility], CRNA Referring MD:              Medicines:                Monitored Anesthesia Care Complications:            No immediate complications. Estimated Blood Loss:     Estimated blood loss: none. Procedure:                Pre-Anesthesia Assessment:                           - Prior to the procedure, a History and Physical                            was performed, and patient medications and                            allergies were reviewed. The patient's tolerance of                            previous anesthesia was also reviewed. The risks                            and benefits of the procedure and the sedation                            options and risks were discussed with the patient.                            All questions were answered, and informed consent                            was obtained. Prior Anticoagulants: The patient has                            taken no previous anticoagulant or antiplatelet                            agents. ASA Grade Assessment: II - A patient with                            mild systemic disease. After reviewing the risks  and benefits, the patient was deemed in                            satisfactory condition to undergo the procedure.                           After obtaining informed consent, the endoscope was                            passed under direct vision. Throughout the                            procedure, the patient's blood pressure, pulse, and                             oxygen saturations were monitored continuously. The                            [id number]-[id number] ([id number] Olympus gastroscope was                            introduced through the mouth, and advanced to the                            third part of duodenum. The upper GI endoscopy was                            accomplished without difficulty. The patient                            tolerated the procedure well. Scope In: Scope Out: Findings:      The larynx was normal.      A small hiatal hernia was present.      The entire examined stomach was normal.      The duodenal bulb, first portion of the duodenum, second portion of the       duodenum and third portion of the duodenum were normal.      The exam was otherwise without abnormality. Impression:               - Normal larynx.                           - Small hiatal hernia.                           - Normal stomach.                           - Normal duodenal bulb, first portion of the                            duodenum, second portion of the duodenum and third                            portion of the duodenum.                           -  The examination was otherwise normal.                           - No specimens collected. Moderate Sedation:      Not Applicable - Patient had care per Anesthesia. Recommendation:           - Soft diet today.                           - Continue present medications.                           - Return to GI clinic PRN.                           - Telephone GI clinic if symptomatic PRN.                           - Perform a colonoscopy today. Procedure Code(s):        --- Professional ---                           [phone], Esophagogastroduodenoscopy, flexible,                            transoral; diagnostic, including collection of                            specimen(s) by brushing or washing, when performed                            (separate procedure) Diagnosis Code(s):        ---  Professional ---                           K44.9, Diaphragmatic hernia without obstruction or                            gangrene                           D50.9, Iron deficiency anemia, unspecified                           R19.5, Other fecal abnormalities CPT copyright 2019 American Medical Association. All rights reserved. The codes documented in this report are preliminary and upon coder review may  be revised to meet current compliance requirements. Vida Rigger, MD 08/26/2020 9:10:56 AM This report has been signed electronically. Number of Addenda: 0

## 2020-08-28 ENCOUNTER — Encounter (HOSPITAL_COMMUNITY): Payer: Self-pay | Admitting: Gastroenterology

## 2020-09-03 DIAGNOSIS — K5900 Constipation, unspecified: Secondary | ICD-10-CM | POA: Diagnosis not present

## 2020-09-03 DIAGNOSIS — G809 Cerebral palsy, unspecified: Secondary | ICD-10-CM | POA: Diagnosis not present

## 2020-09-03 DIAGNOSIS — D500 Iron deficiency anemia secondary to blood loss (chronic): Secondary | ICD-10-CM | POA: Diagnosis not present

## 2020-09-13 DIAGNOSIS — N200 Calculus of kidney: Secondary | ICD-10-CM | POA: Diagnosis not present

## 2020-09-13 DIAGNOSIS — N289 Disorder of kidney and ureter, unspecified: Secondary | ICD-10-CM | POA: Diagnosis not present

## 2020-09-13 DIAGNOSIS — G809 Cerebral palsy, unspecified: Secondary | ICD-10-CM | POA: Diagnosis not present

## 2020-09-28 DIAGNOSIS — Z993 Dependence on wheelchair: Secondary | ICD-10-CM | POA: Diagnosis not present

## 2020-09-28 DIAGNOSIS — K5900 Constipation, unspecified: Secondary | ICD-10-CM | POA: Diagnosis not present

## 2020-09-28 DIAGNOSIS — D500 Iron deficiency anemia secondary to blood loss (chronic): Secondary | ICD-10-CM | POA: Diagnosis not present

## 2020-10-02 ENCOUNTER — Encounter: Payer: Self-pay | Admitting: Urology

## 2020-10-02 ENCOUNTER — Ambulatory Visit (INDEPENDENT_AMBULATORY_CARE_PROVIDER_SITE_OTHER): Payer: Medicare Other | Admitting: Urology

## 2020-10-02 ENCOUNTER — Other Ambulatory Visit: Payer: Self-pay

## 2020-10-02 VITALS — BP 100/68 | HR 78

## 2020-10-02 DIAGNOSIS — N200 Calculus of kidney: Secondary | ICD-10-CM | POA: Diagnosis not present

## 2020-10-02 NOTE — Progress Notes (Signed)
Urological Symptom Review  Patient is experiencing the following symptoms: Kidney stones   Review of Systems  Gastrointestinal (upper)  : Vomiting  Gastrointestinal (lower) : Diarrhea Constipation  Constitutional : Negative for symptoms  Skin: Negative for skin symptoms  Eyes: Negative for eye symptoms  Ear/Nose/Throat : Negative for Ear/Nose/Throat symptoms  Hematologic/Lymphatic: Negative for Hematologic/Lymphatic symptoms  Cardiovascular : Negative for cardiovascular symptoms  Respiratory : Negative for respiratory symptoms  Endocrine: Negative for endocrine symptoms  Musculoskeletal: Negative for musculoskeletal symptoms  Neurological: Negative for neurological symptoms  Psychologic: Negative for psychiatric symptoms

## 2020-10-02 NOTE — Patient Instructions (Signed)
Textbook of Natural Medicine (5th ed., pp. 1518-1527.e3). [place], [place]: Elsevier.">  Dietary Guidelines to Help Prevent Kidney Stones Kidney stones are deposits of minerals and salts that form inside your kidneys. Your risk of developing kidney stones may be greater depending on your diet, your lifestyle, the medicines you take, and whether you have certain medical conditions. Most people can lower their chances of developing kidney stones by following the instructions below. Your dietitian may give you more specific instructions depending on your overall health and the type of kidney stones youtend to develop. What are tips for following this plan? Reading food labels  Choose foods with "no salt added" or "low-salt" labels. Limit your salt (sodium) intake to less than 1,500 mg a day. Choose foods with calcium for each meal and snack. Try to eat about 300 mg of calcium at each meal. Foods that contain 200-500 mg of calcium a serving include: 8 oz (237 mL) of milk, calcium-fortifiednon-dairy milk, and calcium-fortifiedfruit juice. Calcium-fortified means that calcium has been added to these drinks. 8 oz (237 mL) of kefir, yogurt, and soy yogurt. 4 oz (114 g) of tofu. 1 oz (28 g) of cheese. 1 cup (150 g) of dried figs. 1 cup (91 g) of cooked broccoli. One 3 oz (85 g) can of sardines or mackerel. Most people need 1,000-1,500 mg of calcium a day. Talk to your dietitian abouthow much calcium is recommended for you. Shopping Buy plenty of fresh fruits and vegetables. Most people do not need to avoid fruits and vegetables, even if these foods contain nutrients that may contribute to kidney stones. When shopping for convenience foods, choose: Whole pieces of fruit. Pre-made salads with dressing on the side. Low-fat fruit and yogurt smoothies. Avoid buying frozen meals or prepared deli foods. These can be high in sodium. Look for foods with live cultures, such as yogurt and kefir. Choose high-fiber  grains, such as whole-wheat breads, oat bran, and wheat cereals. Cooking Do not add salt to food when cooking. Place a salt shaker on the table and allow each person to add his or her own salt to taste. Use vegetable protein, such as beans, textured vegetable protein (TVP), or tofu, instead of meat in pasta, casseroles, and soups. Meal planning Eat less salt, if told by your dietitian. To do this: Avoid eating processed or pre-made food. Avoid eating fast food. Eat less animal protein, including cheese, meat, poultry, or fish, if told by your dietitian. To do this: Limit the number of times you have meat, poultry, fish, or cheese each week. Eat a diet free of meat at least 2 days a week. Eat only one serving each day of meat, poultry, fish, or seafood. When you prepare animal protein, cut pieces into small portion sizes. For most meat and fish, one serving is about the size of the palm of your hand. Eat at least five servings of fresh fruits and vegetables each day. To do this: Keep fruits and vegetables on hand for snacks. Eat one piece of fruit or a handful of berries with breakfast. Have a salad and fruit at lunch. Have two kinds of vegetables at dinner. Limit foods that are high in a substance called oxalate. These include: Spinach (cooked), rhubarb, beets, sweet potatoes, and Swiss chard. Peanuts. Potato chips, french fries, and baked potatoes with skin on. Nuts and nut products. Chocolate. If you regularly take a diuretic medicine, make sure to eat at least 1 or 2 servings of fruits or vegetables that are   high in potassium each day. These include: Avocado. Banana. Orange, prune, carrot, or tomato juice. Baked potato. Cabbage. Beans and split peas. Lifestyle  Drink enough fluid to keep your urine pale yellow. This is the most important thing you can do. Spread your fluid intake throughout the day. If you drink alcohol: Limit how much you use to: 0-1 drink a day for women who  are not pregnant. 0-2 drinks a day for men. Be aware of how much alcohol is in your drink. In the [place] one drink equals one 12 oz bottle of beer (355 mL), one 5 oz glass of wine (148 mL), or one 1 oz glass of hard liquor (44 mL). Lose weight if told by your health care provider. Work with your dietitian to find an eating plan and weight loss strategies that work best for you.  General information Talk to your health care provider and dietitian about taking daily supplements. You may be told the following depending on your health and the cause of your kidney stones: Not to take supplements with vitamin C. To take a calcium supplement. To take a daily probiotic supplement. To take other supplements such as magnesium, fish oil, or vitamin B6. Take over-the-counter and prescription medicines only as told by your health care provider. These include supplements. What foods should I limit? Limit your intake of the following foods, or eat them as told by your dietitian. Vegetables Spinach. Rhubarb. Beets. Canned vegetables. Pickles. Olives. Baked potatoeswith skin. Grains Wheat bran. Baked goods. Salted crackers. Cereals high in sugar. Meats and other proteins Nuts. Nut butters. Large portions of meat, poultry, or fish. Salted, precooked,or cured meats, such as sausages, meat loaves, and hot dogs. Dairy Cheese. Beverages Regular soft drinks. Regular vegetable juice. Seasonings and condiments Seasoning blends with salt. Salad dressings. Soy sauce. Ketchup. Barbecue sauce. Other foods Canned soups. Canned pasta sauce. Casseroles. Pizza. Lasagna. Frozen meals.Potato chips. French fries. The items listed above may not be a complete list of foods and beverages you should limit. Contact a dietitian for more information. What foods should I avoid? Talk to your dietitian about specific foods you should avoid based on the typeof kidney stones you have and your overall health. Fruits Grapefruit. The  item listed above may not be a complete list of foods and beverages you should avoid. Contact a dietitian for more information. Summary Kidney stones are deposits of minerals and salts that form inside your kidneys. You can lower your risk of kidney stones by making changes to your diet. The most important thing you can do is drink enough fluid. Drink enough fluid to keep your urine pale yellow. Talk to your dietitian about how much calcium you should have each day, and eat less salt and animal protein as told by your dietitian. This information is not intended to replace advice given to you by your health care provider. Make sure you discuss any questions you have with your healthcare provider. Document Revised: 02/03/2019 Document Reviewed: 02/03/2019 Elsevier Patient Education  2022 [employer]  

## 2020-10-02 NOTE — Progress Notes (Signed)
10/02/2020 3:11 PM   [name] December 13, 1960 [id number]  Referring provider: Aliene Beams, MD [id number] [place],  [place] [address]  Followup nephrolithiasis   HPI: [name] is a 60yo here for followup for nephrolithiasis. 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 Last renal US from 10/2019 showed no definitive left renal calculi. He denies any flank pain.NO stone events since last visit. 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   PONV (postoperative nausea and vomiting)     Surgical History: Past Surgical History:  Procedure Laterality Date   COLONOSCOPY WITH PROPOFOL N/A 08/26/2020   Procedure: COLONOSCOPY WITH PROPOFOL;  Surgeon: Vida Rigger, MD;  Location: [facility];  Service: Endoscopy;  Laterality: N/A;   ESOPHAGOGASTRODUODENOSCOPY (EGD) WITH PROPOFOL N/A 08/26/2020   Procedure: ESOPHAGOGASTRODUODENOSCOPY (EGD) WITH PROPOFOL;  Surgeon: Vida Rigger, MD;  Location: [facility];  Service: Endoscopy;  Laterality: N/A;   IR NEPHROSTOMY PLACEMENT RIGHT  01/03/2019   ROBOT ASSISTED LAPAROSCOPIC NEPHRECTOMY Right 02/24/2019   Procedure: XI ROBOTIC ASSISTED LAPAROSCOPIC NEPHRECTOMY;  Surgeon: Malen Gauze, MD;  Location: [facility];  Service: Urology;  Laterality: Right;  3 HRS   TENDON RELEASE      Home Medications:  Allergies as of 10/02/2020   No Known Allergies      Medication List        Accurate as of October 02, 2020  3:11 PM. If you have any questions, ask your nurse or doctor.          acetaminophen 325 MG tablet Commonly known as: TYLENOL Take 325 mg by mouth every 6 (six) hours as needed for moderate pain or headache.   cetirizine 10 MG tablet Commonly known as: ZYRTEC Take 10 mg by mouth daily.   Iron 325 (65 Fe) MG  Tabs Take by mouth.   liver oil-zinc oxide 40 % ointment Commonly known as: DESITIN Apply 1 application topically daily as needed for irritation.   Vitamin D (Ergocalciferol) 1.25 MG (50000 UNIT) Caps capsule Commonly known as: DRISDOL Take 50,000 Units by mouth once a week.        Allergies: No Known Allergies  Family History: Family History  Problem Relation Age of Onset   Diabetes Mother    Heart failure Mother    Hypertension Mother    Diabetes Father    Heart failure Father    Hypertension Father    Cancer Father     Social History:  reports that he has never smoked. He has never used smokeless tobacco. He reports that he does not drink alcohol and does not use drugs.  ROS: All other review of systems were reviewed and are negative except what is noted above in HPI  Physical Exam: BP 100/68   Pulse 78   Constitutional:  Alert and oriented, No acute distress. HEENT: [name] AT, moist mucus membranes.  Trachea midline, no masses. Cardiovascular: No clubbing, cyanosis, or edema. Respiratory: Normal respiratory effort, no increased work of breathing. GI: Abdomen is soft, nontender, nondistended, no abdominal masses GU: No CVA tenderness.  Lymph: No cervical or inguinal lymphadenopathy. Skin: No rashes, bruises or suspicious lesions. Neurologic: Grossly intact, no focal deficits, moving all 4 extremities. Psychiatric: Normal mood and affect.  Laboratory Data: Lab Results  Component Value Date   WBC 9.4 08/26/2020   HGB 8.2 (L) 08/26/2020   HCT 28.6 (L) 08/26/2020   MCV 80.8 08/26/2020   PLT 465 (H) 08/26/2020    Lab Results  Component Value Date   CREATININE 0.80 08/22/2020    No results found for: PSA  No results found for: TESTOSTERONE  No results found for: HGBA1C  Urinalysis    Component Value Date/Time   COLORURINE YELLOW 11/24/2014 1630   APPEARANCEUR CLEAR 11/24/2014 1630   LABSPEC 1.015 11/24/2014 1630   PHURINE 7.5 11/24/2014 1630    GLUCOSEU NEGATIVE 11/24/2014 1630   HGBUR LARGE (A) 11/24/2014 1630   BILIRUBINUR NEGATIVE 11/24/2014 1630   KETONESUR NEGATIVE 11/24/2014 1630   PROTEINUR 30 (A) 11/24/2014 1630   UROBILINOGEN 0.2 11/24/2014 1630   NITRITE POSITIVE (A) 11/24/2014 1630   LEUKOCYTESUR LARGE (A) 11/24/2014 1630    Lab Results  Component Value Date   BACTERIA FEW (A) 11/24/2014    Pertinent Imaging: Renal US 11/05/2020: Images reviewed and discussed with the patient Results for orders placed during the hospital encounter of 02/24/19  DG Abd 1 View  Narrative CLINICAL DATA:  Abdominal distension.  EXAM: ABDOMEN - 1 VIEW  COMPARISON:  CT dated 12/02/2018  FINDINGS: There is gaseous distention of loops of small bowel and colon scattered throughout the abdomen. There is a drainage catheter projecting over the patient's right lower quadrant and midline abdomen. There is a large amount of stool at the level of the rectum.  IMPRESSION: 1. There are gaseous distended loops of small bowel and colon scattered throughout the abdomen. 2. Large amount of stool at the level of the rectum suggesting fecal impaction. 3. A drainage catheter projects over the patient's low abdomen. Correlation with history is recommended.   Electronically Signed By: Katherine Mantle M.D. On: 02/25/2019 21:38  No results found for this or any previous visit.  No results found for this or any previous visit.  No results found for this or any previous visit.  Results for orders placed during the hospital encounter of 11/15/19  Ultrasound renal complete  Narrative CLINICAL DATA:  Follow-up examination for nephrolithiasis.  EXAM: RENAL / URINARY TRACT ULTRASOUND COMPLETE  COMPARISON:  Prior renal ultrasound from 06/06/2019.  FINDINGS: Right Kidney:  Prior right nephrectomy.  No abnormality about the right renal bed.  Left Kidney:  Renal measurements: 10.9 x 5.5 x 5.3 cm = volume: 168.4 mL.  Renal echogenicity remains within normal limits. No definite echogenic calculi evident on today's exam. No hydronephrosis or obstructive uropathy. No focal renal mass.  Bladder:  Appears normal for degree of bladder distention. A left ureteral jet was not visualized.  Other:  None.  IMPRESSION: 1. Normal sonographic appearance of the left kidney. No nephrolithiasis evident on today's exam. 2. Prior right nephrectomy.   Electronically Signed By: Rise Mu M.D. On: 11/15/2019 21:04  No results found for this or any previous visit.  No results found for this or any previous visit.  No results found for this or any previous visit.   Assessment & Plan:    1. Nephrolithiasis -REnal US, Will call with results. If normal RTC 1 yea rwith renal US   No follow-ups on file.  [name], MD  [facility] [place]

## 2020-10-05 ENCOUNTER — Ambulatory Visit (HOSPITAL_COMMUNITY): Payer: Medicare Other

## 2020-10-14 DIAGNOSIS — N289 Disorder of kidney and ureter, unspecified: Secondary | ICD-10-CM | POA: Diagnosis not present

## 2020-10-14 DIAGNOSIS — N200 Calculus of kidney: Secondary | ICD-10-CM | POA: Diagnosis not present

## 2020-10-14 DIAGNOSIS — G809 Cerebral palsy, unspecified: Secondary | ICD-10-CM | POA: Diagnosis not present

## 2020-10-14 IMAGING — CT CT ABD-PEL WO/W CM
3 of 12 series · 10 of 46 positions shown, 16 images · IV contrast (omnipaque)
Comparison: None.

CLINICAL DATA: One day episode of hematuria.

EXAM:
CT ABDOMEN AND PELVIS WITHOUT AND WITH CONTRAST
TECHNIQUE: Multidetector CT imaging of the abdomen and pelvis was performed
following the standard protocol before and following the bolus
administration of intravenous contrast.
CONTRAST:  125mL OMNIPAQUE IOHEXOL 300 MG/ML  SOLN

[Series 3: axial post · axial · 0.64mm/px · z∈[-224,+141]mm · 5 of 111 slices shown, 10 images]
[im 19/111  soft-tissue]
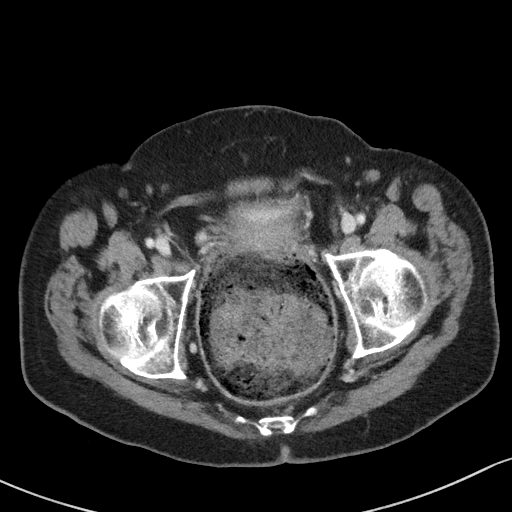
[im 19/111  bone]
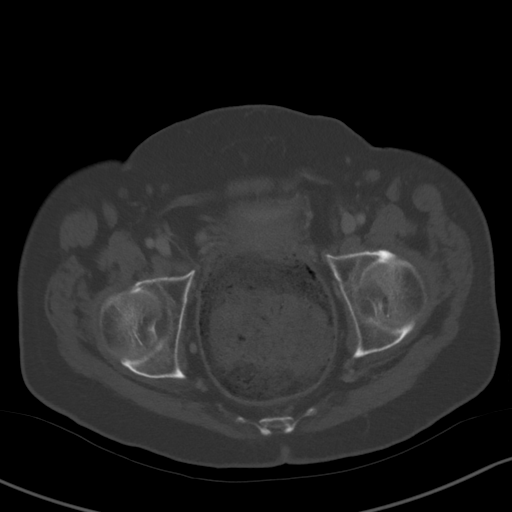
[im 37/111  soft-tissue]
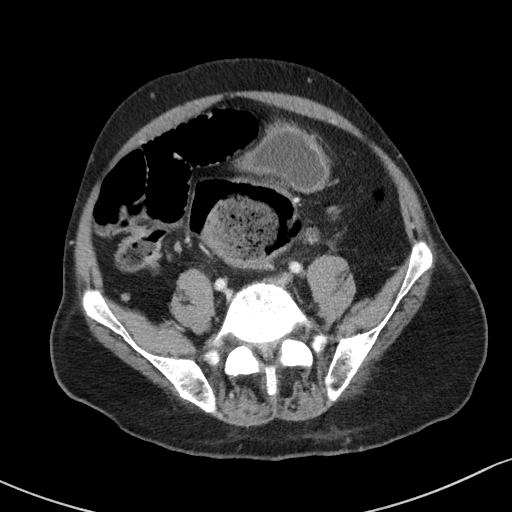
[im 37/111  lung]
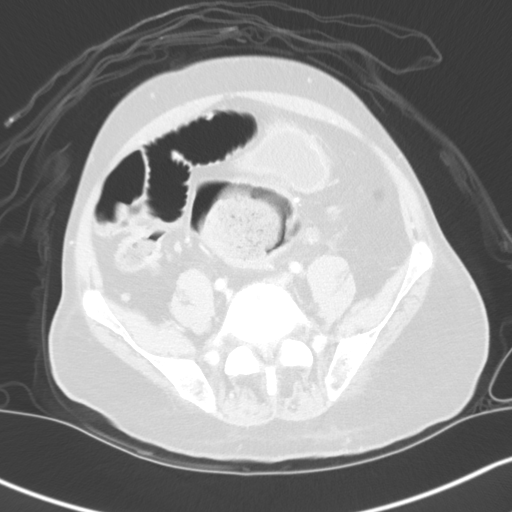
[im 56/111  soft-tissue]
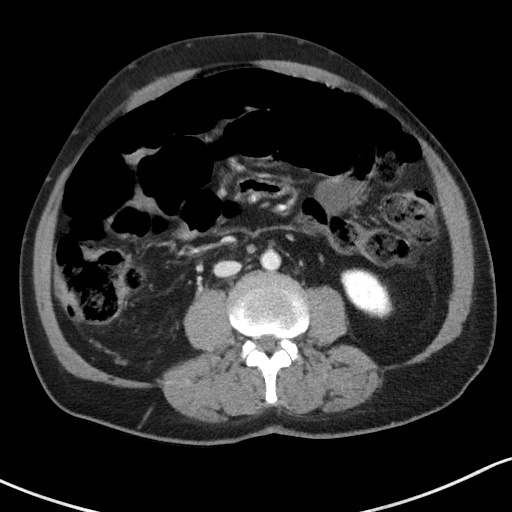
[im 56/111  lung]
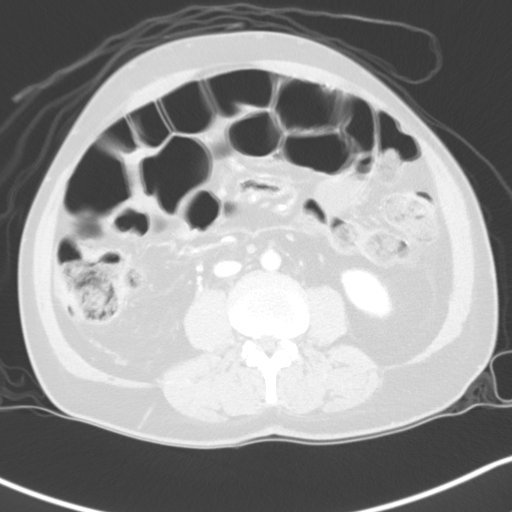
[im 74/111  soft-tissue]
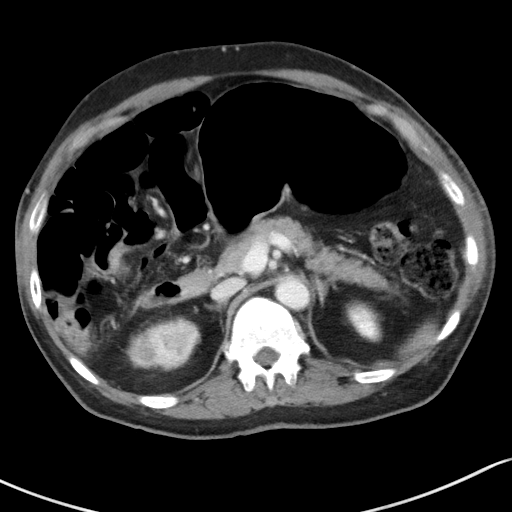
[im 74/111  lung]
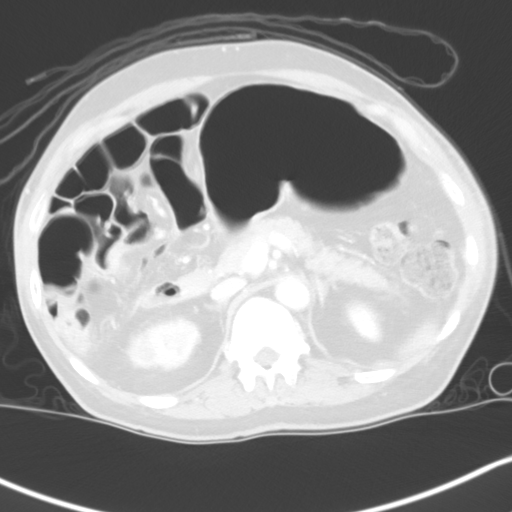
[im 92/111  soft-tissue]
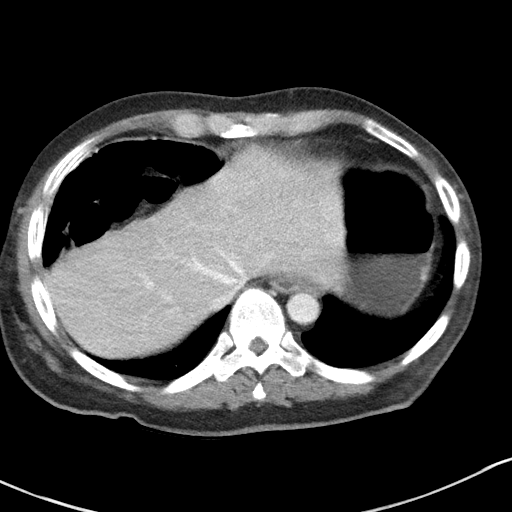
[im 92/111  lung]
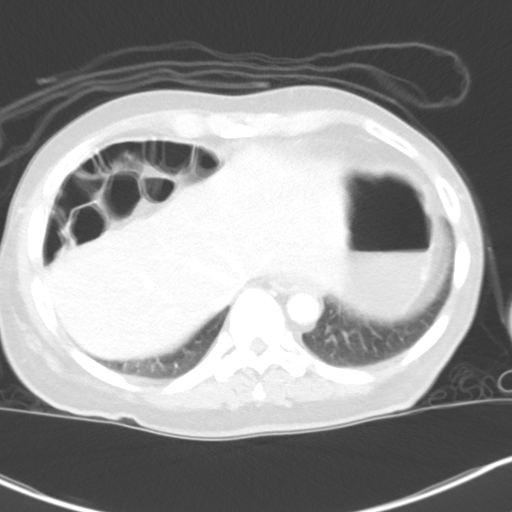

[Series 4: axial delay · axial · delayed · 0.64mm/px · z∈[-234,-44]mm · 3 of 116 slices shown]
[im 20/116  soft-tissue]
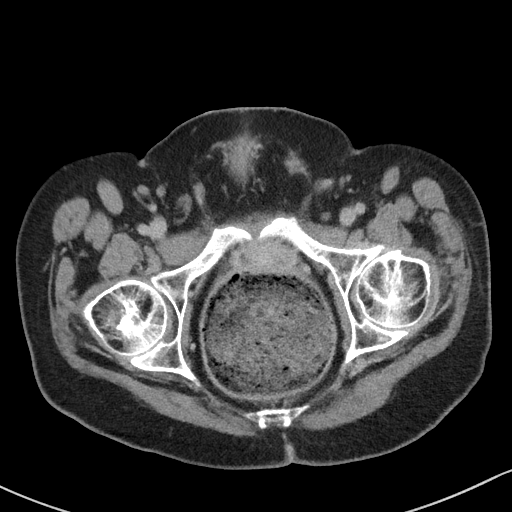
[im 39/116  soft-tissue]
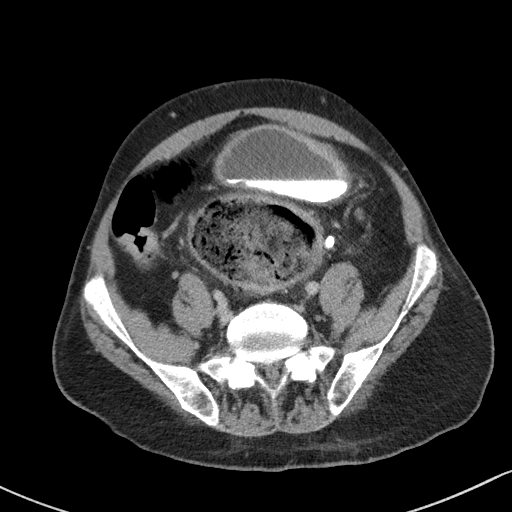
[im 58/116  soft-tissue]
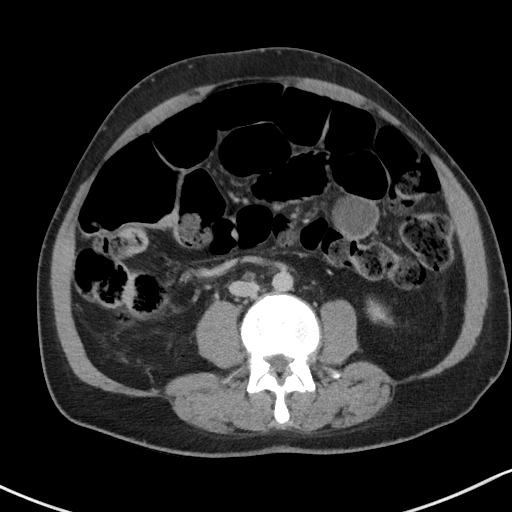

[Series 11: coronal pre · coronal · non-contrast · 0.76mm/px · 2 of 101 slices shown, 3 images]
[im 34/101  soft-tissue]
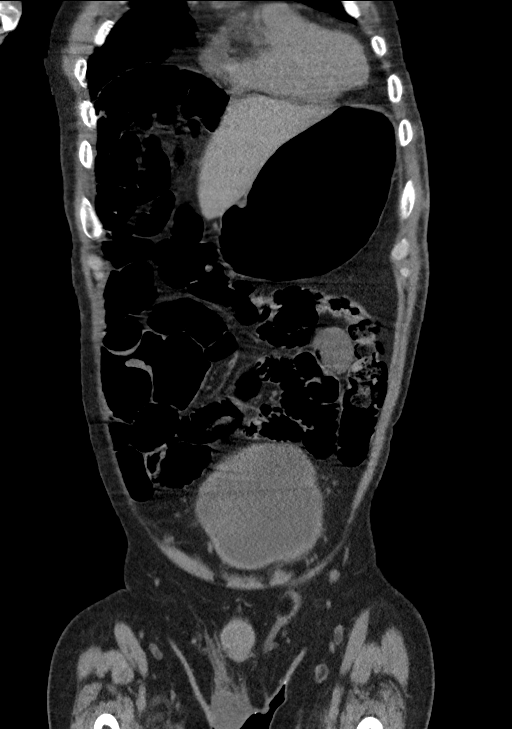
[im 34/101  bone]
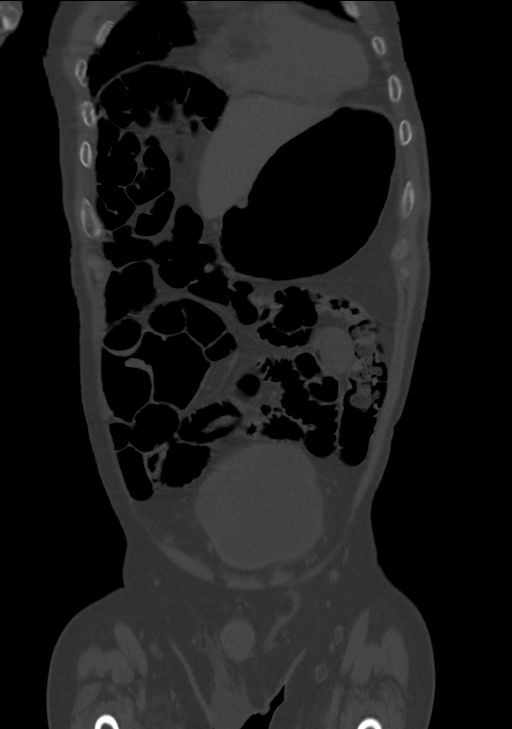
[im 67/101  soft-tissue]
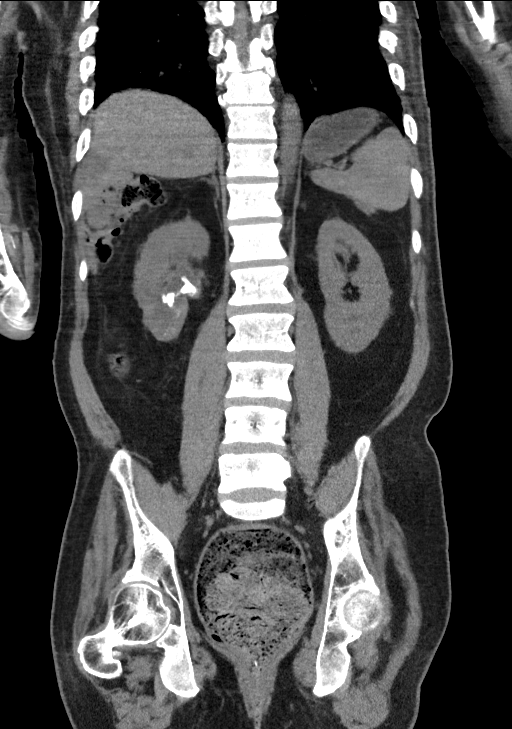

[10 of 46 positions shown; findings below may reference images not displayed]

FINDINGS: Lower chest: Mild subsegmental atelectasis in the right lower lobe.
Airway thickening is present, suggesting bronchitis or reactive
airways disease. Dilated fluid-filled distal esophagus.

Hepatobiliary: Unremarkable

Pancreas: Unremarkable

Spleen: Unremarkable

Adrenals/Urinary Tract: The adrenal glands appear normal. A 3.2 by
0.9 by 1.2 cm staghorn calculus fills the right UPJ and extends into
the proximal ureter.

There is also a right kidney lower pole nonobstructive 1.1 by 1.1 cm
calculus on image 34/11.

Faint speckled calcifications in the left kidney lower pole are
probably dystrophic parenchymal calcifications. Mild urinary bladder
wall thickening.

There is abnormal hypoenhancement of the right kidney compared to
the left with lack of excretion of contrast into the collecting
system, and thinning of the cortex of the right kidney with moderate
hydronephrosis and poor separation the renal medulla from the
collecting system. Mild accentuated enhancement in the collecting
system is specially adjacent to the large stone. The appearance
resembles stage xanthogranulomatous pyelonephritis (XGP). I do not
see a discrete mass.

No left-sided calculi are observed. No abnormal filling defects
along the left collecting system or ureter.

Faint linear depending calcification in the urinary bladder on image
85/2, potentially representing tiny bladder calculi.

Stomach/Bowel: Distended stomach. The duodenum crosses the midline
but does not extend back up to the level of the duodenal bulb but
rather extends caudad, suggesting a lax ligament of Treitz but not
overt malrotation. Constipation is present and there is a highly
distended rectum full of stool. No inflammatory findings around the
rectum to further suggest stercoral colitis.

There scattered mildly dilated loops of small bowel measuring up to
3.1 cm in diameter.

Vascular/Lymphatic: Mildly enlarged retrocaval lymph node at 1.1 cm
on image 48/3, probably reactive.

Reproductive: The rectal distension anteriorly displaces the
prostate gland, which appears somewhat elongated.

Other: No supplemental non-categorized findings.

Musculoskeletal: Congenitally short pedicles in the lumbar spine.
Possible prominence of the anterior venous plexus in the lower
lumbar spine.
IMPRESSION: 1. A 3.2 by 0.9 by 1.2 cm staghorn calculus fills the right UPJ and
extends into the proximal ureter. There is also a right kidney lower
pole nonobstructive 1.1 by 1.1 cm calculus.
2. Abnormal hypoenhancement of the right kidney with lack of
excretion of contrast into the collecting system, and thinning of
the cortex of the right kidney with dilated calices and poor
definition of the renal medulla. The appearance is suspicious for
stage 1 xanthogranulomatous pyelonephritis (XGP), which may also
explain the staghorn calculi.
3. Mild urinary bladder wall thickening may reflect cystitis.
Suspected tiny calculi dependently in the urinary bladder.
4. Dilated fluid-filled distal esophagus, query dysmotility or
reactive airways disease.
5. Constipation with a highly distended rectum full of stool.
6. Congenitally short pedicles in the lumbar spine.
7. Airway thickening is present, suggesting bronchitis or reactive
airways disease.
8. The duodenum crosses the midline but does not extend back up to
the level of the duodenal bulb, suggesting a lax ligament of Treitz
but not overt malrotation.
9. Mildly enlarged retrocaval lymph node, probably reactive.

## 2020-10-19 ENCOUNTER — Other Ambulatory Visit: Payer: Self-pay

## 2020-10-19 ENCOUNTER — Ambulatory Visit (HOSPITAL_COMMUNITY)
Admission: RE | Admit: 2020-10-19 | Discharge: 2020-10-19 | Disposition: A | Discharge status: Home / Self Care | Payer: Medicare Other | Source: Ambulatory Visit | Attending: Urology | Admitting: Urology

## 2020-10-19 DIAGNOSIS — N200 Calculus of kidney: Secondary | ICD-10-CM

## 2020-10-22 ENCOUNTER — Telehealth: Payer: Self-pay

## 2020-10-22 NOTE — Telephone Encounter (Signed)
Left message to return call office for results

## 2020-10-22 NOTE — Telephone Encounter (Signed)
-----   Message from Malen Gauze, MD sent at 10/22/2020  8:48 AM EDT ----- [place] normal ----- Message ----- From: Ferdinand Lango, RN Sent: 10/22/2020   8:00 AM EDT To: Malen Gauze, MD  Please review

## 2020-10-30 NOTE — Progress Notes (Signed)
Results printed and mailed.   

## 2020-10-30 NOTE — Telephone Encounter (Signed)
Results printed and mailed.   

## 2020-11-14 DIAGNOSIS — N289 Disorder of kidney and ureter, unspecified: Secondary | ICD-10-CM | POA: Diagnosis not present

## 2020-11-14 DIAGNOSIS — G809 Cerebral palsy, unspecified: Secondary | ICD-10-CM | POA: Diagnosis not present

## 2020-11-14 DIAGNOSIS — N200 Calculus of kidney: Secondary | ICD-10-CM | POA: Diagnosis not present

## 2020-11-15 IMAGING — XA IR NEPHROSTOMY PLACEMENT RIGHT
1 series · 4 of 4 positions shown · non-contrast
Comparison: CT of the abdomen and pelvis on 12/02/2018

CLINICAL DATA: Obstructing calculus of right renal pelvis and UPJ
with planned percutaneous nephrolithotomy procedure after renal and
ureteral access.

EXAM:
1. ULTRASOUND GUIDANCE FOR PUNCTURE OF THE RIGHT RENAL COLLECTING
SYSTEM.
2. RIGHT PERCUTANEOUS NEPHROSTOMY TUBE PLACEMENT.

[Series 1: run · 0.19mm/px · 4 of 4 slices shown]
[im 1/4]
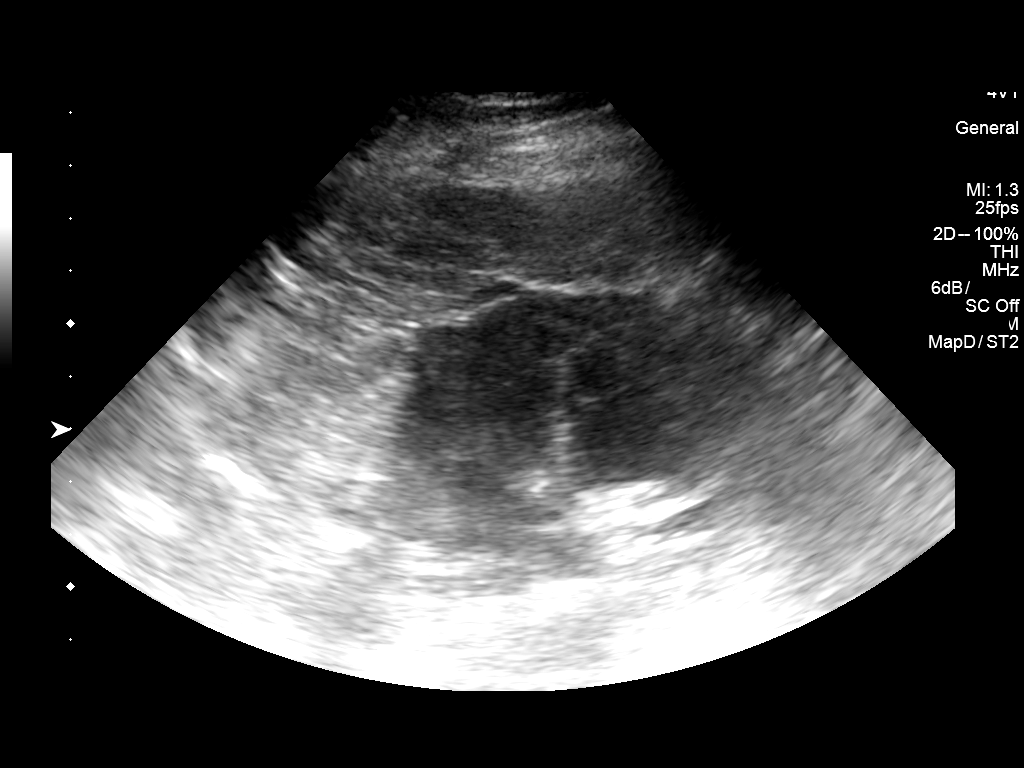
[im 2/4]
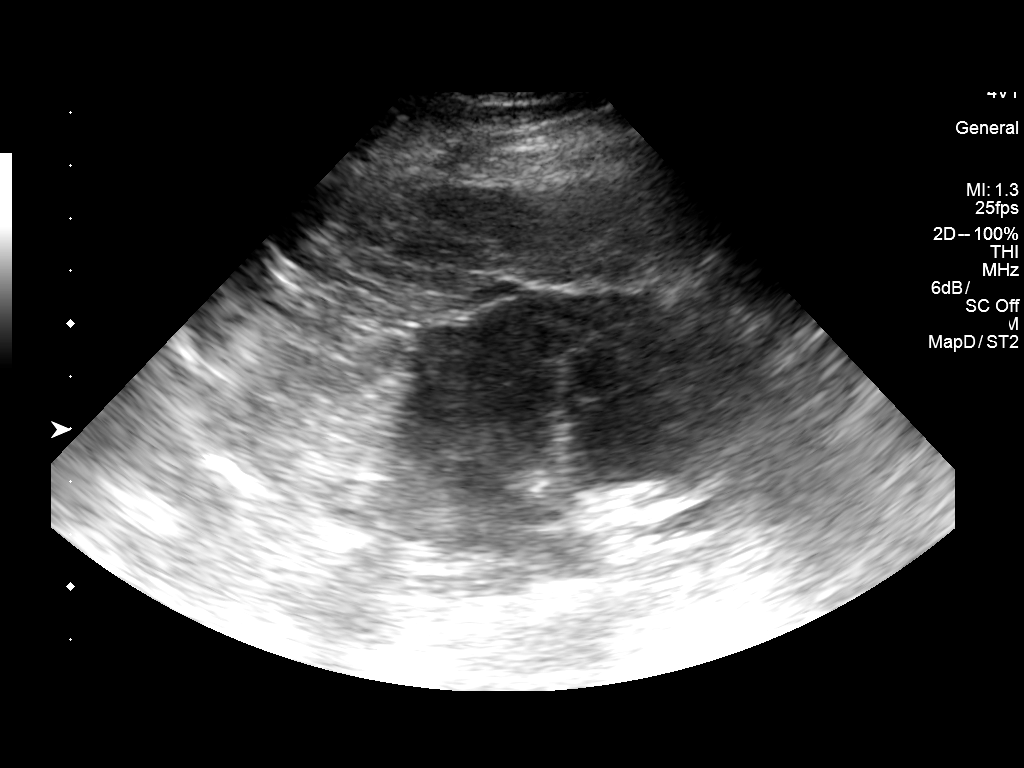
[im 3/4]
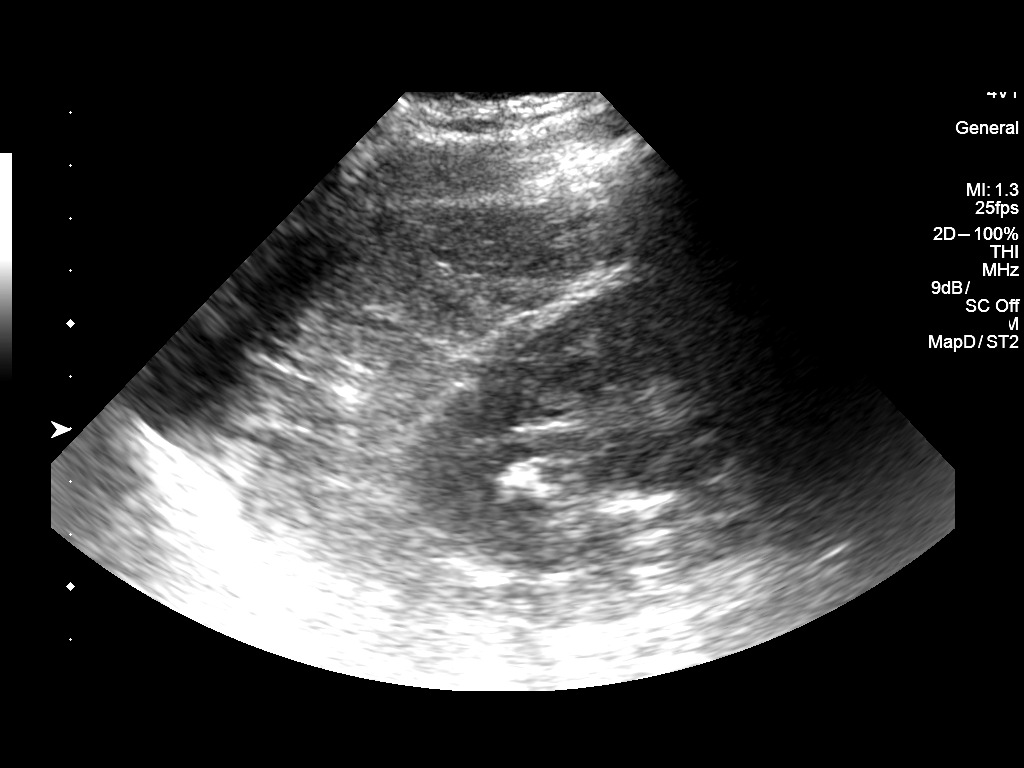
[im 4/4]
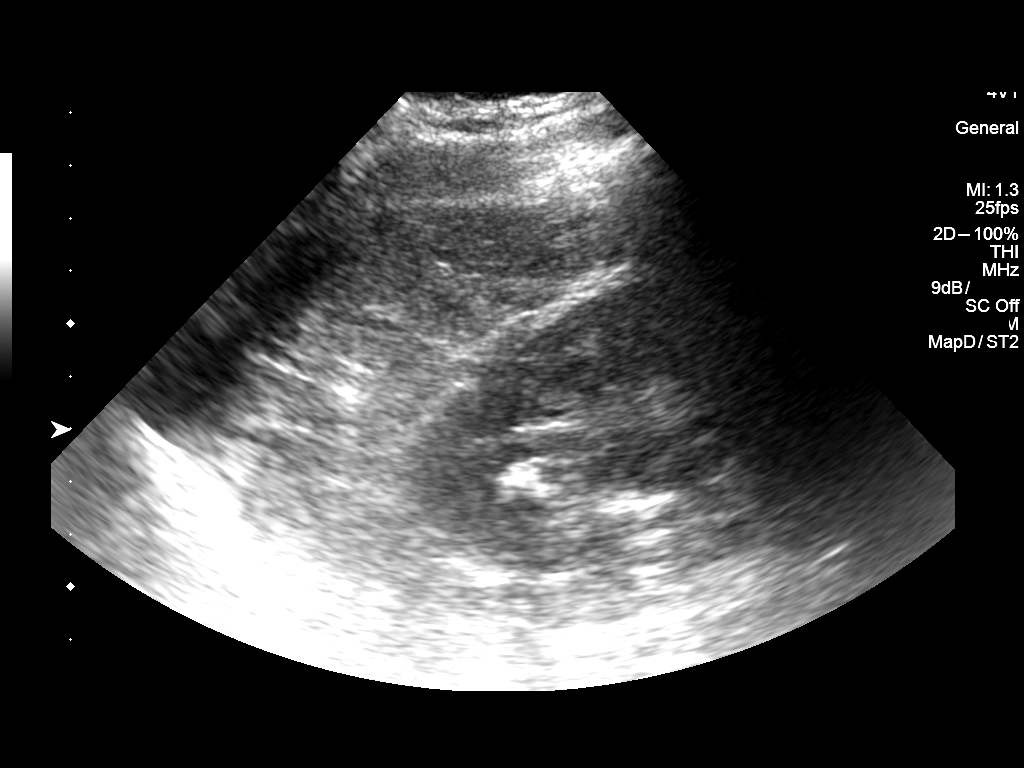

[4 of 4 positions shown; findings below may reference images not displayed]

ANESTHESIA/SEDATION:
1.75 mg IV Versed; 50 mcg IV Fentanyl.

Total Moderate Sedation Time

44 minutes.

The patient's level of consciousness and physiologic status were
continuously monitored during the procedure by Radiology nursing.

CONTRAST:  15 ml Omnipaque 300

MEDICATIONS:
2 g IV Rocephin. Antibiotic was administered in an appropriate time
frame prior to skin puncture.

FLUOROSCOPY TIME:  7 minutes and 24 seconds. 93.2 mGy.

PROCEDURE:
The procedure, risks, benefits, and alternatives were explained to
the patient. Questions regarding the procedure were encouraged and
answered. The patient understands and consents to the procedure. A
time-out was performed prior to initiating the procedure.

The right flank region was prepped with chlorhexidine in a sterile
fashion, and a sterile drape was applied covering the operative
field. A sterile gown and sterile gloves were used for the
procedure. Local anesthesia was provided with 1% Lidocaine.

Ultrasound was used to localize the right kidney. Under direct
ultrasound guidance, a 21 gauge needle was advanced into the renal
collecting system. Ultrasound image documentation was performed.
Aspiration of urine sample was performed followed by contrast
injection.

A second 21 gauge needle was utilized in puncturing a lower pole
calyx under fluoroscopy. After aspiration of urine, contrast was
injected. A transitional dilator was advanced over a guidewire.
Percutaneous tract dilatation was then performed over the guidewire.
A 8.5-French percutaneous nephrostomy tube was then advanced and
formed in the collecting system. Catheter position was confirmed by
fluoroscopy after contrast injection.

The catheter was secured at the skin with a Prolene retention suture
and Stat-Lock device. A gravity bag was placed.

COMPLICATIONS:
None.
FINDINGS: Under ultrasound guidance, central puncture of the collecting system
was performed for initial opacification. There was immediate return
of whitish purulent appearing fluid and contrast injection
demonstrated a very distorted appearing collecting system. There was
some flow of contrast into the ureter around the known central
calculi. The pelvic and UPJ calculus is not radiopaque by x-ray.

Additional lower pole access was performed more peripherally in the
kidney allowing placement of a nephrostomy tube which was partially
formed in the renal pelvis and extends into a lower pole
infundibulum. A sample of purulent and bloody fluid was aspirated
and sent for culture analysis. The nephrostomy tube was connected to
gravity bag drainage.
IMPRESSION: Puncture of the right renal collecting system revealed purulent
urine return. Based on this finding, a nephrostomy tube was placed
and a sample sent for culture analysis. Findings were discussed with
Dr. Dubbeldam.

## 2020-12-14 DIAGNOSIS — N200 Calculus of kidney: Secondary | ICD-10-CM | POA: Diagnosis not present

## 2020-12-14 DIAGNOSIS — N289 Disorder of kidney and ureter, unspecified: Secondary | ICD-10-CM | POA: Diagnosis not present

## 2020-12-14 DIAGNOSIS — G809 Cerebral palsy, unspecified: Secondary | ICD-10-CM | POA: Diagnosis not present

## 2021-01-14 DIAGNOSIS — N200 Calculus of kidney: Secondary | ICD-10-CM | POA: Diagnosis not present

## 2021-01-14 DIAGNOSIS — N289 Disorder of kidney and ureter, unspecified: Secondary | ICD-10-CM | POA: Diagnosis not present

## 2021-01-14 DIAGNOSIS — G809 Cerebral palsy, unspecified: Secondary | ICD-10-CM | POA: Diagnosis not present

## 2021-02-13 DIAGNOSIS — G809 Cerebral palsy, unspecified: Secondary | ICD-10-CM | POA: Diagnosis not present

## 2021-02-13 DIAGNOSIS — N200 Calculus of kidney: Secondary | ICD-10-CM | POA: Diagnosis not present

## 2021-02-13 DIAGNOSIS — N289 Disorder of kidney and ureter, unspecified: Secondary | ICD-10-CM | POA: Diagnosis not present

## 2021-03-16 DIAGNOSIS — G809 Cerebral palsy, unspecified: Secondary | ICD-10-CM | POA: Diagnosis not present

## 2021-03-16 DIAGNOSIS — N289 Disorder of kidney and ureter, unspecified: Secondary | ICD-10-CM | POA: Diagnosis not present

## 2021-03-16 DIAGNOSIS — N200 Calculus of kidney: Secondary | ICD-10-CM | POA: Diagnosis not present

## 2021-04-16 DIAGNOSIS — N200 Calculus of kidney: Secondary | ICD-10-CM | POA: Diagnosis not present

## 2021-04-16 DIAGNOSIS — G809 Cerebral palsy, unspecified: Secondary | ICD-10-CM | POA: Diagnosis not present

## 2021-04-16 DIAGNOSIS — N289 Disorder of kidney and ureter, unspecified: Secondary | ICD-10-CM | POA: Diagnosis not present

## 2021-04-18 IMAGING — US US RENAL
1 series · 14 of 25 positions shown · non-contrast
Comparison: 12/02/2018 CT

CLINICAL DATA: Follow-up of renal calculi. Right nephrectomy in
[REDACTED].

EXAM:
RENAL / URINARY TRACT ULTRASOUND COMPLETE

[Series 1: us renal · 14 of 30 slices shown]
[im 1/30]
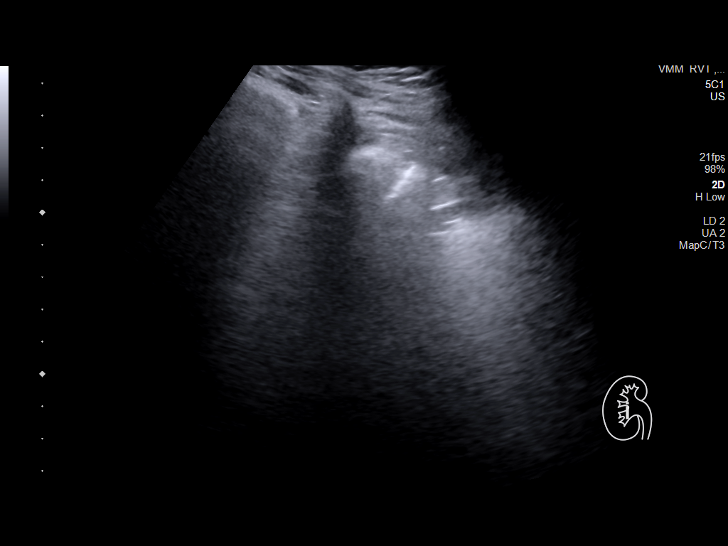
[im 3/30]
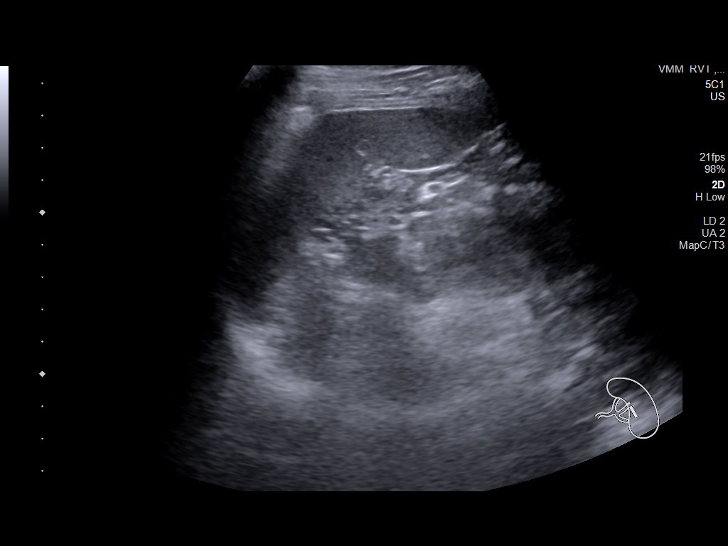
[im 5/30]
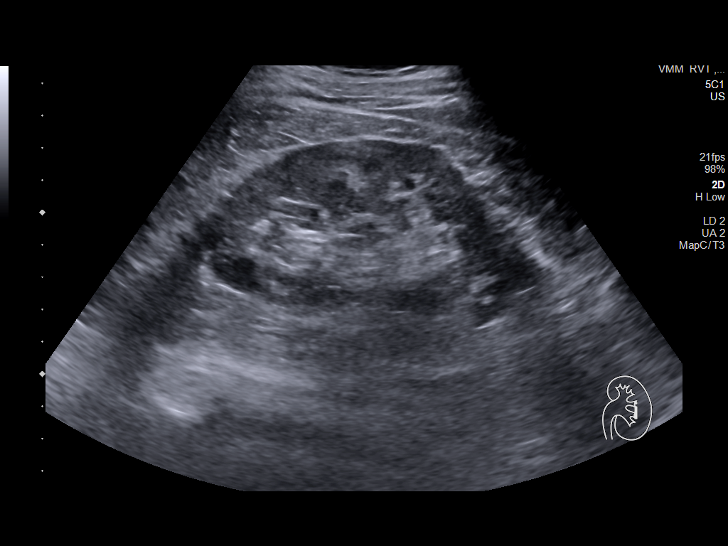
[im 8/30]
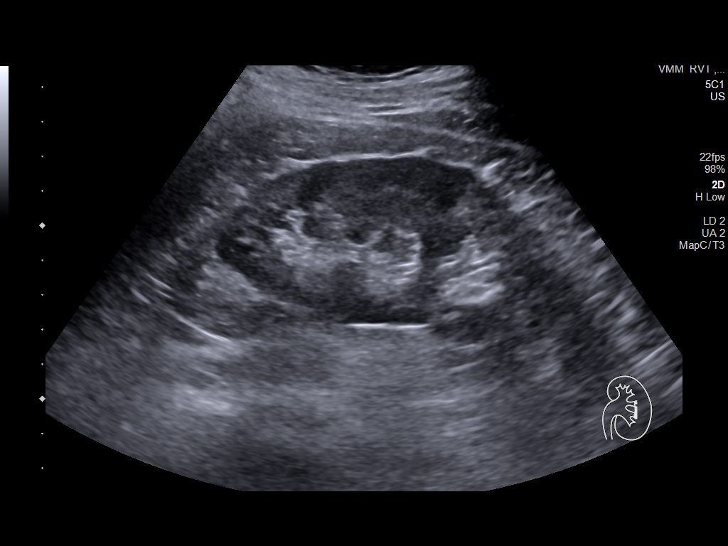
[im 10/30]
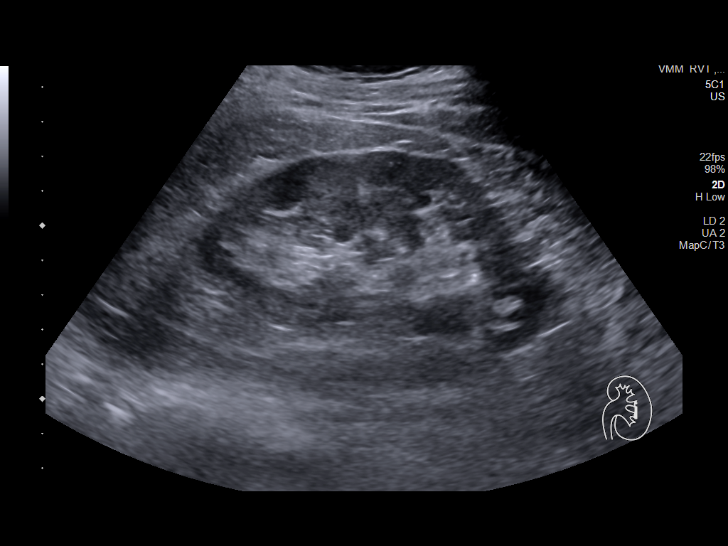
[im 11/30]
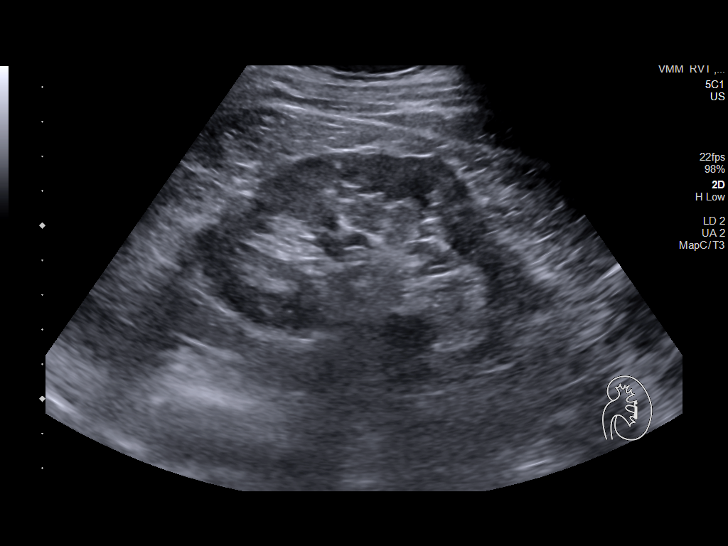
[im 14/30]
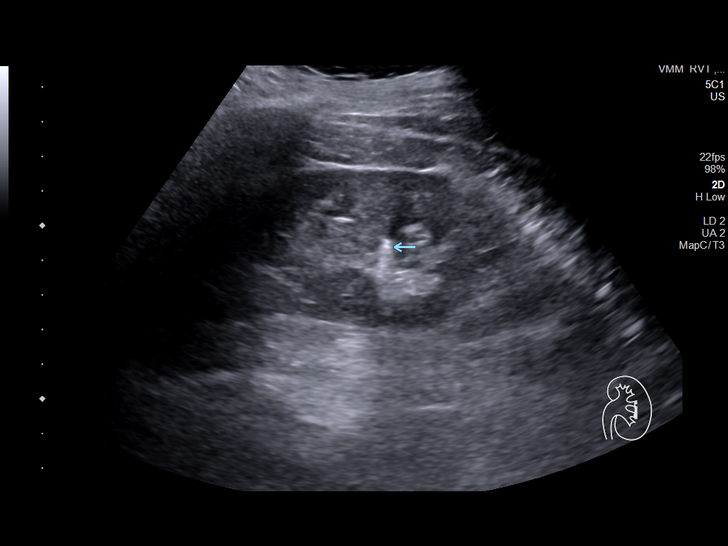
[im 16/30]
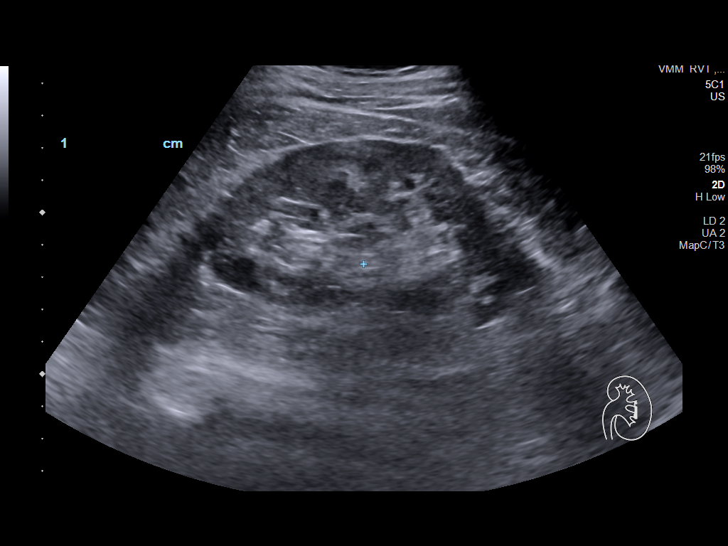
[im 19/30]
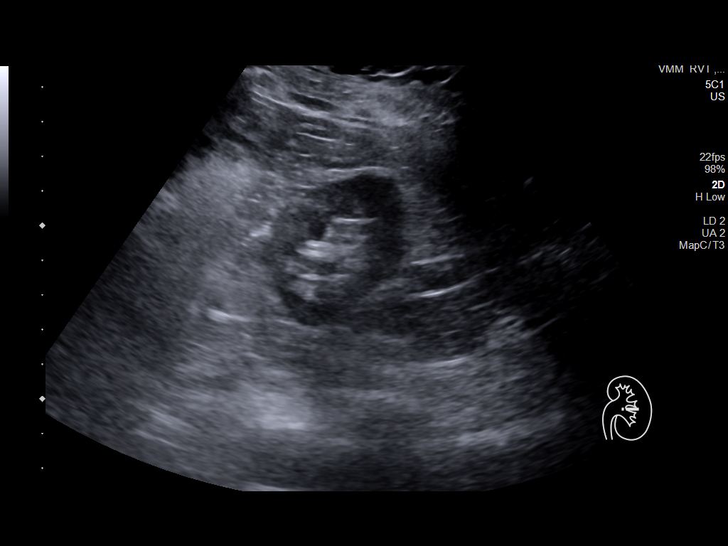
[im 20/30]
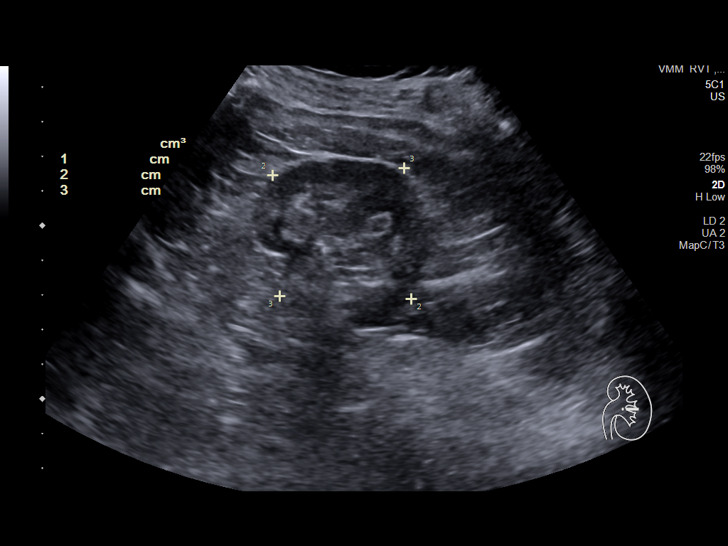
[im 22/30]
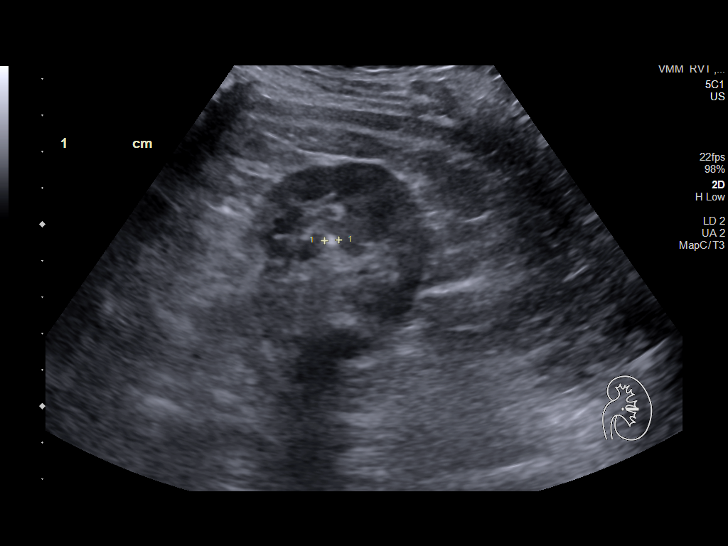
[im 25/30]
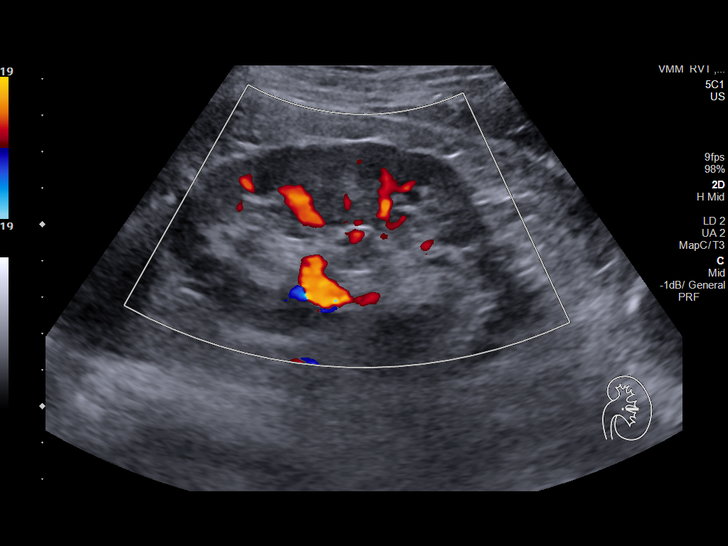
[im 27/30]
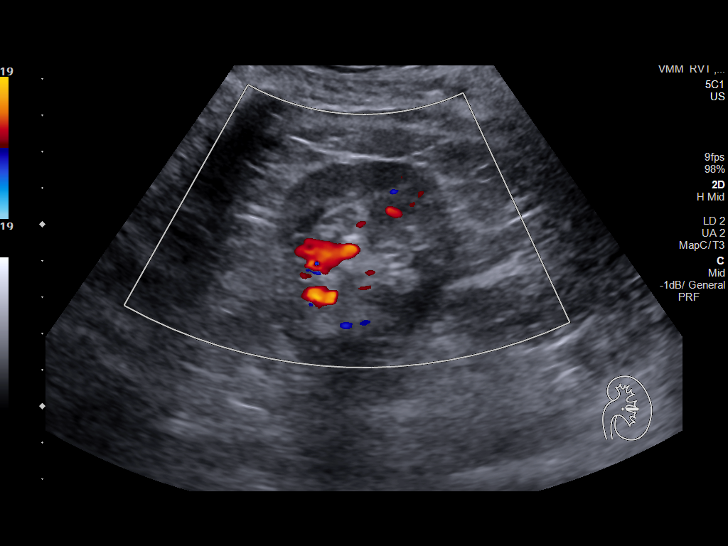
[im 30/30]
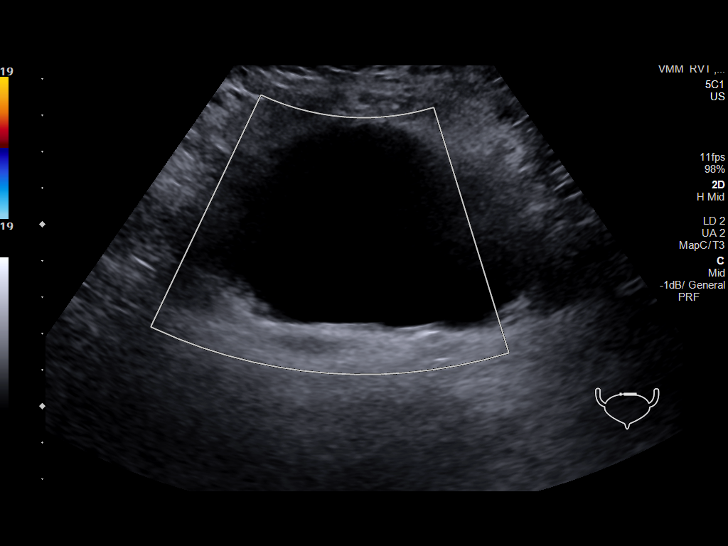

[14 of 25 positions shown; findings below may reference images not displayed]

FINDINGS: Right Kidney:

Surgically absent

Left Kidney:

Renal measurements: 11.0 x 5.3 x 5.1 cm = volume: 159 mL. Normal
echogenicity. Although no stones are seen on 12/02/2018 CT,
suspicion of left renal calculi at up to 4 mm in the lower pole. A
smaller interpolar calcification is also seen. No hydronephrosis.

Bladder:

Appears normal for degree of bladder distention.

Other:

None.
IMPRESSION: 1. Right nephrectomy.
2. Probable left renal calculi, without hydronephrosis.

## 2021-05-14 DIAGNOSIS — N289 Disorder of kidney and ureter, unspecified: Secondary | ICD-10-CM | POA: Diagnosis not present

## 2021-05-14 DIAGNOSIS — N200 Calculus of kidney: Secondary | ICD-10-CM | POA: Diagnosis not present

## 2021-05-14 DIAGNOSIS — G809 Cerebral palsy, unspecified: Secondary | ICD-10-CM | POA: Diagnosis not present

## 2021-06-14 DIAGNOSIS — G809 Cerebral palsy, unspecified: Secondary | ICD-10-CM | POA: Diagnosis not present

## 2021-06-14 DIAGNOSIS — N289 Disorder of kidney and ureter, unspecified: Secondary | ICD-10-CM | POA: Diagnosis not present

## 2021-06-14 DIAGNOSIS — N200 Calculus of kidney: Secondary | ICD-10-CM | POA: Diagnosis not present

## 2021-07-18 DIAGNOSIS — Z136 Encounter for screening for cardiovascular disorders: Secondary | ICD-10-CM | POA: Diagnosis not present

## 2021-07-18 DIAGNOSIS — Z1211 Encounter for screening for malignant neoplasm of colon: Secondary | ICD-10-CM | POA: Diagnosis not present

## 2021-07-18 DIAGNOSIS — R739 Hyperglycemia, unspecified: Secondary | ICD-10-CM | POA: Diagnosis not present

## 2021-07-18 DIAGNOSIS — D500 Iron deficiency anemia secondary to blood loss (chronic): Secondary | ICD-10-CM | POA: Diagnosis not present

## 2021-07-18 DIAGNOSIS — Z7189 Other specified counseling: Secondary | ICD-10-CM | POA: Diagnosis not present

## 2021-07-18 DIAGNOSIS — Z1159 Encounter for screening for other viral diseases: Secondary | ICD-10-CM | POA: Diagnosis not present

## 2021-07-18 DIAGNOSIS — G809 Cerebral palsy, unspecified: Secondary | ICD-10-CM | POA: Diagnosis not present

## 2021-07-18 DIAGNOSIS — E559 Vitamin D deficiency, unspecified: Secondary | ICD-10-CM | POA: Diagnosis not present

## 2021-07-18 DIAGNOSIS — Z993 Dependence on wheelchair: Secondary | ICD-10-CM | POA: Diagnosis not present

## 2021-07-18 DIAGNOSIS — Z905 Acquired absence of kidney: Secondary | ICD-10-CM | POA: Diagnosis not present

## 2021-07-18 DIAGNOSIS — Z0000 Encounter for general adult medical examination without abnormal findings: Secondary | ICD-10-CM | POA: Diagnosis not present

## 2021-07-18 DIAGNOSIS — Z13220 Encounter for screening for lipoid disorders: Secondary | ICD-10-CM | POA: Diagnosis not present

## 2021-08-09 DIAGNOSIS — E870 Hyperosmolality and hypernatremia: Secondary | ICD-10-CM | POA: Diagnosis not present

## 2021-09-27 ENCOUNTER — Ambulatory Visit (HOSPITAL_COMMUNITY)
Admission: RE | Admit: 2021-09-27 | Discharge: 2021-09-27 | Disposition: A | Discharge status: Home / Self Care | Payer: Medicare Other | Source: Ambulatory Visit | Attending: Urology | Admitting: Urology

## 2021-09-27 DIAGNOSIS — N1330 Unspecified hydronephrosis: Secondary | ICD-10-CM | POA: Diagnosis not present

## 2021-09-27 DIAGNOSIS — N200 Calculus of kidney: Secondary | ICD-10-CM | POA: Diagnosis not present

## 2021-09-27 IMAGING — US US RENAL
1 series · 14 of 25 positions shown · non-contrast
Comparison: Prior renal ultrasound from 06/06/2019.

CLINICAL DATA: Follow-up examination for nephrolithiasis.

EXAM:
RENAL / URINARY TRACT ULTRASOUND COMPLETE

[Series 1: us renal · 14 of 26 slices shown]
[im 1/26]
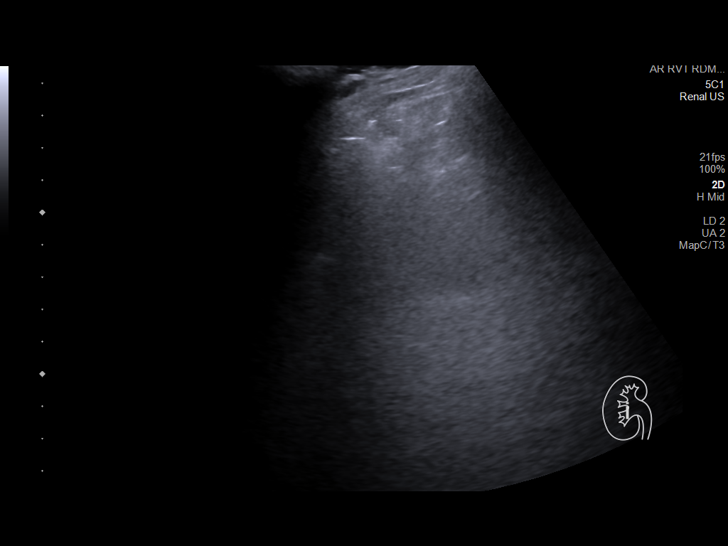
[im 3/26]
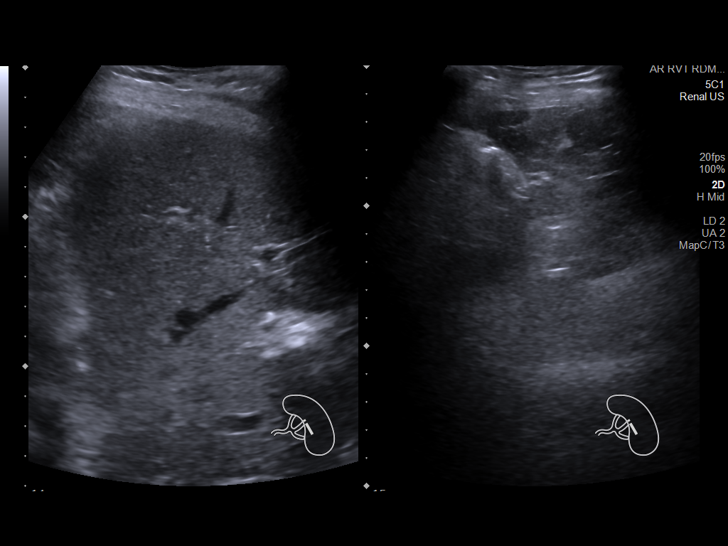
[im 5/26]
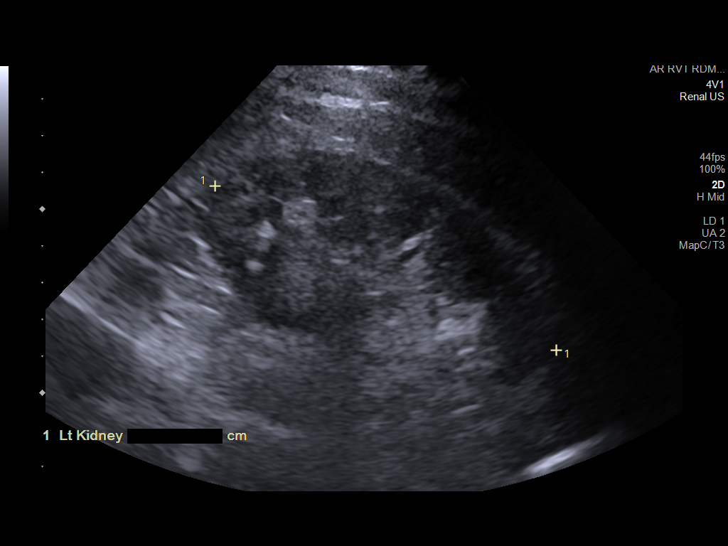
[im 7/26]
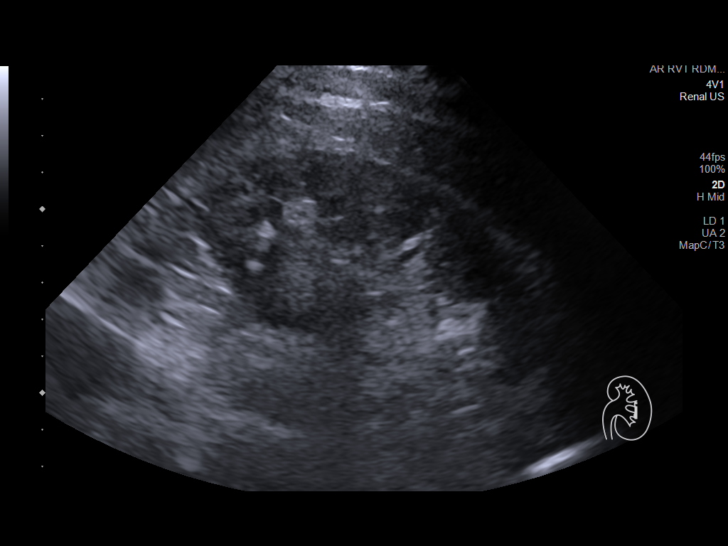
[im 9/26]
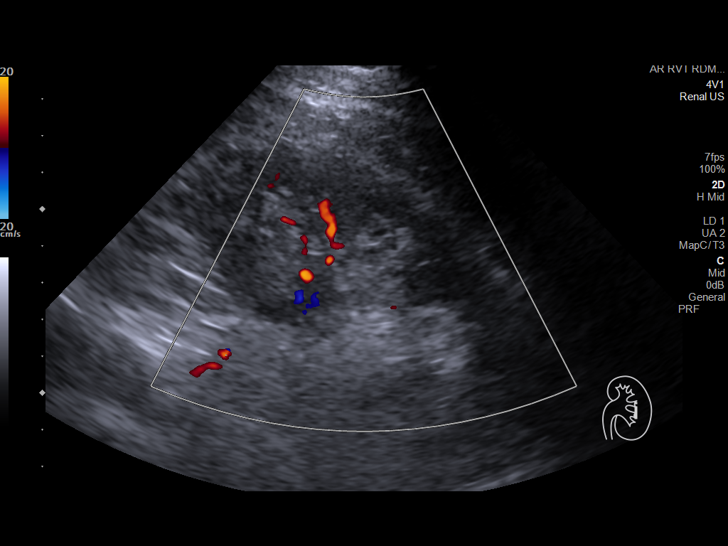
[im 10/26]
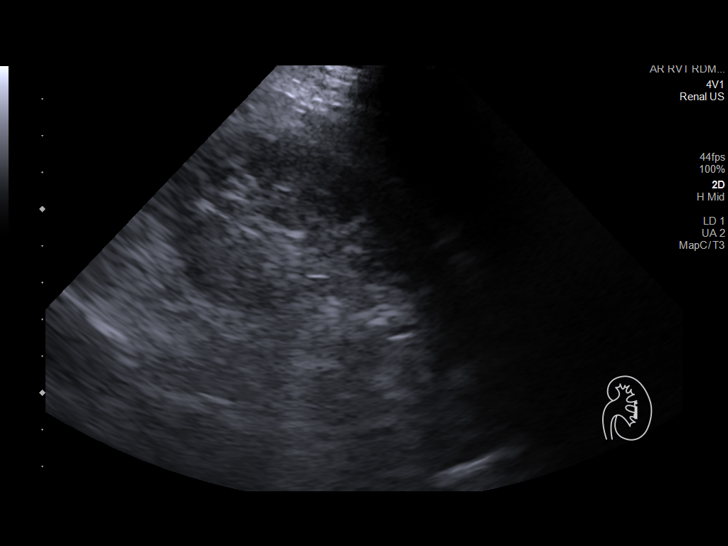
[im 12/26]
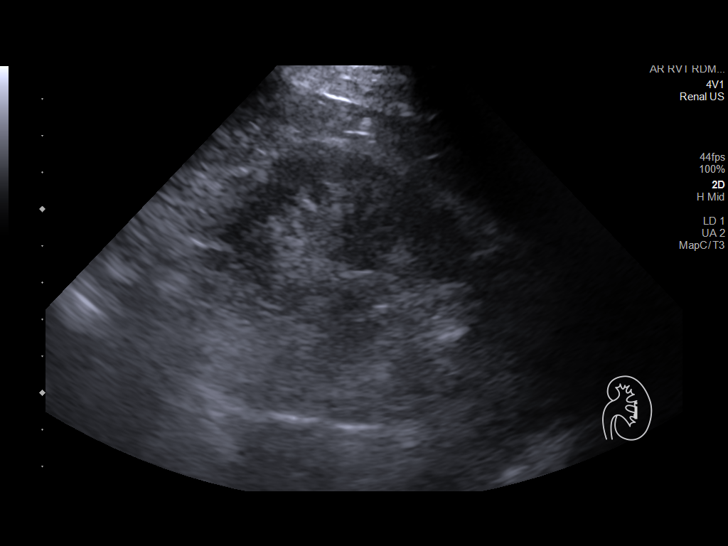
[im 14/26]
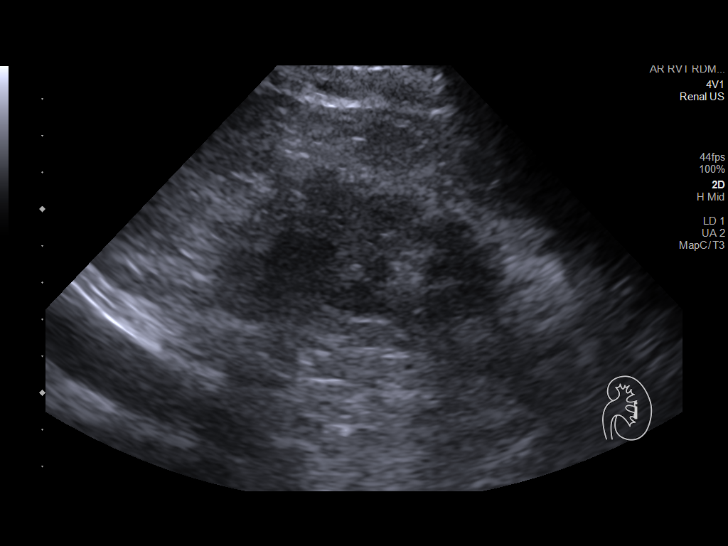
[im 16/26]
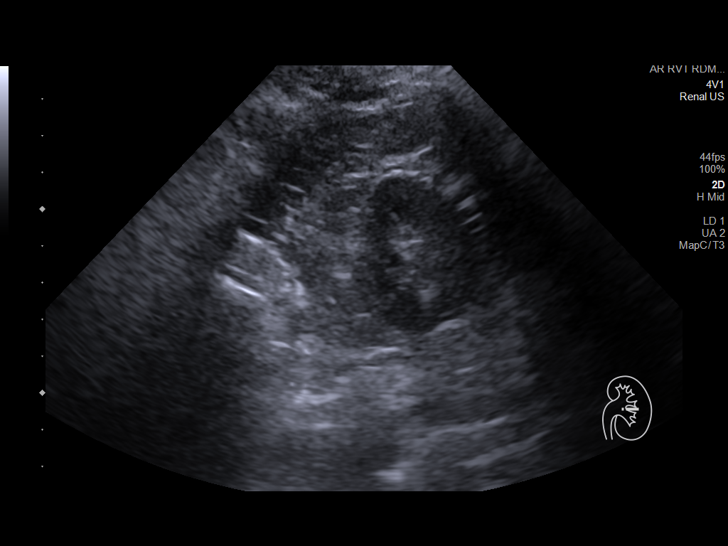
[im 17/26]
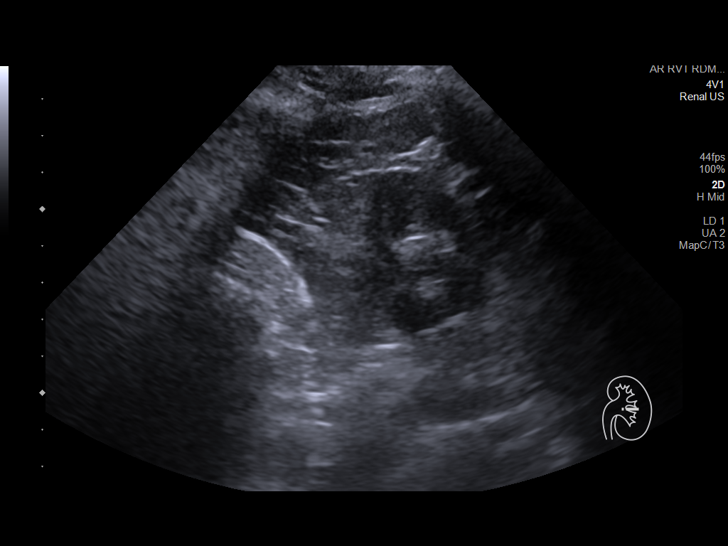
[im 19/26]
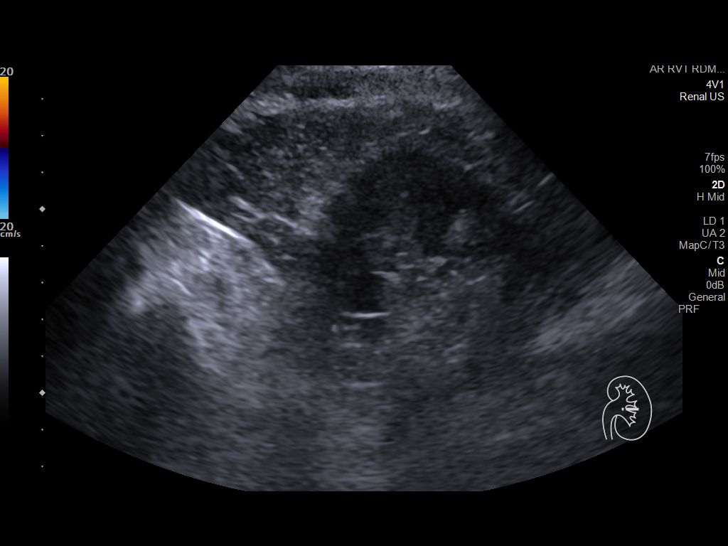
[im 21/26]
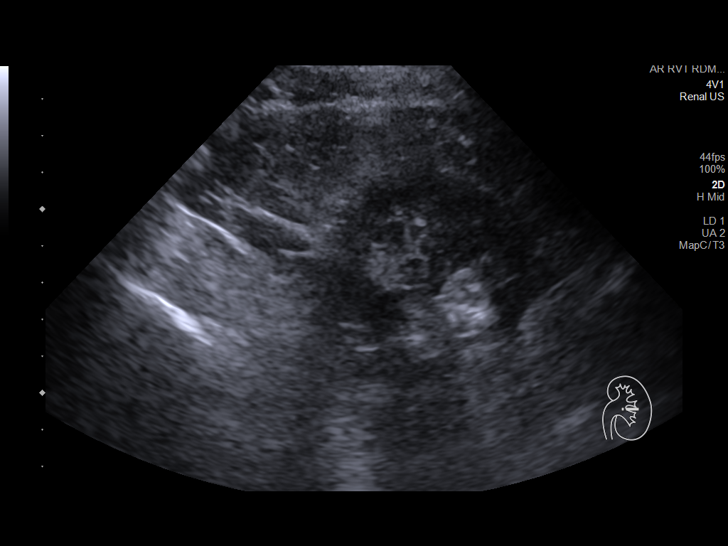
[im 23/26]
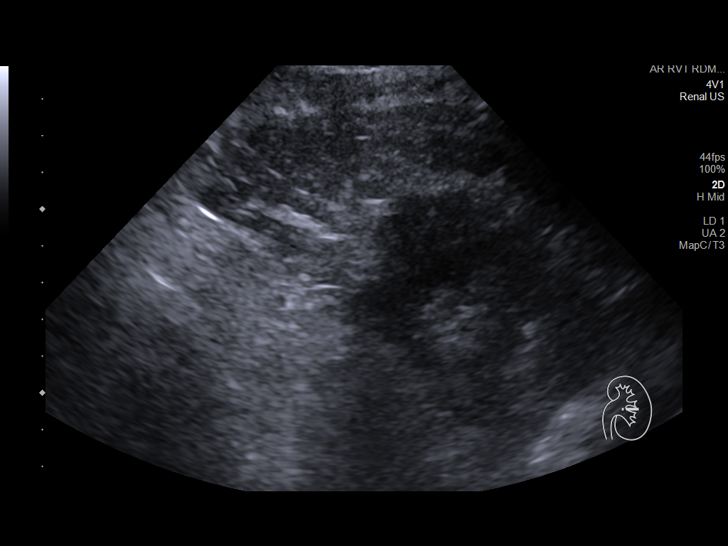
[im 26/26]
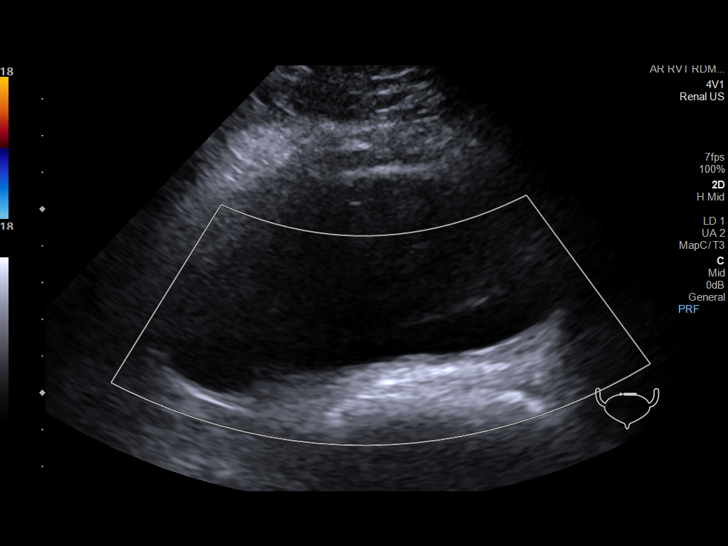

[14 of 25 positions shown; findings below may reference images not displayed]

FINDINGS: Right Kidney:

Prior right nephrectomy.  No abnormality about the right renal bed.

Left Kidney:

Renal measurements: 10.9 x 5.5 x 5.3 cm = volume: 168.4 mL. Renal
echogenicity remains within normal limits. No definite echogenic
calculi evident on today's exam. No hydronephrosis or obstructive
uropathy. No focal renal mass.

Bladder:

Appears normal for degree of bladder distention. A left ureteral jet
was not visualized.

Other:

None.
IMPRESSION: 1. Normal sonographic appearance of the left kidney. No
nephrolithiasis evident on today's exam.
2. Prior right nephrectomy.

## 2021-10-03 ENCOUNTER — Ambulatory Visit: Payer: Self-pay

## 2021-10-04 ENCOUNTER — Ambulatory Visit: Payer: Medicare Other | Admitting: Urology

## 2021-10-05 NOTE — Patient Outreach (Signed)
Spoke with the patient's sister Lowanda Foster Care Coordination   Initial Visit Note 10/03/21 Name: MAURISIO RUDDY MRN: [id number] DOB: [birth year]  KOHLTON GILPATRICK is a 61 y.o. year old male who sees Aliene Beams, MD for primary care. I  spoke with the patient's sister Herby Abraham  What matters to the patients health and wellness today?  The patient has no needs or concerns they are handles with primary and specialist if needed.    Goals Addressed               This Visit's Progress     COMPLETED: Care Coordination Activiteis -no follow up required (pt-stated)          SDOH assessments and interventions completed:  Yes  SDOH Interventions Today    Flowsheet Row Most Recent Value  SDOH Interventions   Food Insecurity Interventions Intervention Not Indicated  Housing Interventions Intervention Not Indicated  Transportation Interventions Intervention Not Indicated        Care Coordination Interventions Activated:  No  Care Coordination Interventions:  No, not indicated   Follow up plan: No further intervention required.   Encounter Outcome:  Pt. Visit Completed   Juanell Fairly RN, BSN, [facility] Care Coordinator [facility]   Phone: [phone]

## 2021-10-05 NOTE — Patient Instructions (Signed)
Visit Information  Thank you for taking time to visit with me today. Please don't hesitate to contact me if I can be of assistance to you.   Following are the goals we discussed today:   Goals Addressed               This Visit's Progress     COMPLETED: Care Coordination Activiteis -no follow up required (pt-stated)         Patient verbalizes understanding of instructions and care plan provided today and agrees to view in MyChart. Active MyChart status and patient understanding of how to access instructions and care plan via MyChart confirmed with patient.     Juanell Fairly RN, BSN, [facility] Care Coordinator [facility]   Phone: [phone]

## 2021-10-07 ENCOUNTER — Ambulatory Visit (HOSPITAL_COMMUNITY)
Admission: RE | Admit: 2021-10-07 | Discharge: 2021-10-07 | Disposition: A | Discharge status: Home / Self Care | Payer: Medicare Other | Source: Ambulatory Visit | Attending: Urology | Admitting: Urology

## 2021-10-07 ENCOUNTER — Ambulatory Visit (INDEPENDENT_AMBULATORY_CARE_PROVIDER_SITE_OTHER): Payer: Medicare Other | Admitting: Urology

## 2021-10-07 VITALS — BP 64/47 | HR 82 | Ht 68.0 in | Wt 128.0 lb

## 2021-10-07 DIAGNOSIS — N1330 Unspecified hydronephrosis: Secondary | ICD-10-CM | POA: Diagnosis not present

## 2021-10-07 DIAGNOSIS — N200 Calculus of kidney: Secondary | ICD-10-CM | POA: Insufficient documentation

## 2021-10-07 DIAGNOSIS — R339 Retention of urine, unspecified: Secondary | ICD-10-CM | POA: Diagnosis not present

## 2021-10-07 DIAGNOSIS — N3289 Other specified disorders of bladder: Secondary | ICD-10-CM | POA: Diagnosis not present

## 2021-10-07 DIAGNOSIS — Z87442 Personal history of urinary calculi: Secondary | ICD-10-CM | POA: Diagnosis not present

## 2021-10-07 DIAGNOSIS — M79604 Pain in right leg: Secondary | ICD-10-CM | POA: Diagnosis not present

## 2021-10-07 MED ORDER — TAMSULOSIN HCL 0.4 MG PO CAPS: 0.4000 mg | ORAL_CAPSULE | Freq: Every day | ORAL | 11 refills | Status: DC

## 2021-10-07 NOTE — Progress Notes (Signed)
10/07/2021 11:50 AM   [name] 1960/08/29 [id number]  Referring provider: Aliene Beams, MD [address] [place],  [place] [address]  Followup nephrolithiasis  HPI: [name] is a 61yo here for followup for nephrolithiasis. No stone events since last visit. He is complaining of new left leg/hip pain. Renal US 09/27/2021 shows new left moderate hydronephrosis. He is urinating frequently. No hematuria. CT stone study shows urinary retnetion with moderate hydronephrosis to the level of the UVJ.    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   PONV (postoperative nausea and vomiting)     Surgical History: Past Surgical History:  Procedure Laterality Date   COLONOSCOPY WITH PROPOFOL N/A 08/26/2020   Procedure: COLONOSCOPY WITH PROPOFOL;  Surgeon: Vida Rigger, MD;  Location: [facility];  Service: Endoscopy;  Laterality: N/A;   ESOPHAGOGASTRODUODENOSCOPY (EGD) WITH PROPOFOL N/A 08/26/2020   Procedure: ESOPHAGOGASTRODUODENOSCOPY (EGD) WITH PROPOFOL;  Surgeon: Vida Rigger, MD;  Location: [facility];  Service: Endoscopy;  Laterality: N/A;   IR NEPHROSTOMY PLACEMENT RIGHT  01/03/2019   ROBOT ASSISTED LAPAROSCOPIC NEPHRECTOMY Right 02/24/2019   Procedure: XI ROBOTIC ASSISTED LAPAROSCOPIC NEPHRECTOMY;  Surgeon: Malen Gauze, MD;  Location: [facility];  Service: Urology;  Laterality: Right;  3 HRS   TENDON RELEASE      Home Medications:  Allergies as of 10/07/2021   No Known Allergies      Medication List        Accurate as of October 07, 2021 11:50 AM. If you have any questions, ask your nurse or doctor.          acetaminophen 325 MG tablet Commonly known as: TYLENOL Take 325 mg by mouth every 6 (six) hours as needed for moderate pain or headache.   cetirizine 10 MG tablet Commonly known as:  ZYRTEC Take 10 mg by mouth daily.   Iron 325 (65 Fe) MG Tabs Take by mouth.   liver oil-zinc oxide 40 % ointment Commonly known as: DESITIN Apply 1 application topically daily as needed for irritation.   Desitin 40 % Pste Generic drug: Zinc Oxide See admin instructions.   MiraLax 17 g packet Generic drug: polyethylene glycol 1 packet mixed with 8 ounces of fluid   Vitamin D (Ergocalciferol) 1.25 MG (50000 UNIT) Caps capsule Commonly known as: DRISDOL Take 50,000 Units by mouth once a week.        Allergies: No Known Allergies  Family History: Family History  Problem Relation Age of Onset   Diabetes Mother    Heart failure Mother    Hypertension Mother    Diabetes Father    Heart failure Father    Hypertension Father    Cancer Father     Social History:  reports that he has never smoked. He has never used smokeless tobacco. He reports that he does not drink alcohol and does not use drugs.  ROS: All other review of systems were reviewed and are negative except what is noted above in HPI  Physical Exam: BP (!) 64/47   Pulse 82   Ht 5\' 8"  (1.727 m)   Wt 128 lb (58.1 kg)   BMI 19.46 kg/m   Constitutional:  Alert and oriented, No acute distress. HEENT: [place] AT, moist mucus membranes.  Trachea midline, no masses. Cardiovascular:  No clubbing, cyanosis, or edema. Respiratory: Normal respiratory effort, no increased work of breathing. GI: Abdomen is soft, nontender, nondistended, no abdominal masses GU: No CVA tenderness.  Lymph: No cervical or inguinal lymphadenopathy. Skin: No rashes, bruises or suspicious lesions. Neurologic: Grossly intact, no focal deficits, moving all 4 extremities. Psychiatric: Normal mood and affect.  Laboratory Data: Lab Results  Component Value Date   WBC 9.4 08/26/2020   HGB 8.2 (L) 08/26/2020   HCT 28.6 (L) 08/26/2020   MCV 80.8 08/26/2020   PLT 465 (H) 08/26/2020    Lab Results  Component Value Date   CREATININE 0.80  08/22/2020    No results found for: "PSA"  No results found for: "TESTOSTERONE"  No results found for: "HGBA1C"  Urinalysis    Component Value Date/Time   COLORURINE YELLOW 11/24/2014 1630   APPEARANCEUR CLEAR 11/24/2014 1630   LABSPEC 1.015 11/24/2014 1630   PHURINE 7.5 11/24/2014 1630   GLUCOSEU NEGATIVE 11/24/2014 1630   HGBUR LARGE (A) 11/24/2014 1630   BILIRUBINUR NEGATIVE 11/24/2014 1630   KETONESUR NEGATIVE 11/24/2014 1630   PROTEINUR 30 (A) 11/24/2014 1630   UROBILINOGEN 0.2 11/24/2014 1630   NITRITE POSITIVE (A) 11/24/2014 1630   LEUKOCYTESUR LARGE (A) 11/24/2014 1630    Lab Results  Component Value Date   BACTERIA FEW (A) 11/24/2014    Pertinent Imaging: CT stone study today: Images reviewed and discussed with the patient.  Results for orders placed during the hospital encounter of 02/24/19  DG Abd 1 View  Narrative CLINICAL DATA:  Abdominal distension.  EXAM: ABDOMEN - 1 VIEW  COMPARISON:  CT dated 12/02/2018  FINDINGS: There is gaseous distention of loops of small bowel and colon scattered throughout the abdomen. There is a drainage catheter projecting over the patient's right lower quadrant and midline abdomen. There is a large amount of stool at the level of the rectum.  IMPRESSION: 1. There are gaseous distended loops of small bowel and colon scattered throughout the abdomen. 2. Large amount of stool at the level of the rectum suggesting fecal impaction. 3. A drainage catheter projects over the patient's low abdomen. Correlation with history is recommended.   Electronically Signed By: Katherine Mantle M.D. On: 02/25/2019 21:38  No results found for this or any previous visit.  No results found for this or any previous visit.  No results found for this or any previous visit.  Results for orders placed during the hospital encounter of 10/19/20  Ultrasound renal complete  Narrative CLINICAL DATA:  Nephrolithiasis history.   Right nephrectomy in 2020.  EXAM: RENAL / URINARY TRACT ULTRASOUND COMPLETE  COMPARISON:  None.  FINDINGS: Right Kidney:  Surgically absent.  Left Kidney:  Renal measurements: 10.4 x 5.4 x 5.2 cm = volume: 151 mL. Echogenicity within normal limits. No mass or hydronephrosis visualized.  Bladder:  Appears normal for degree of bladder distention.  Other:  None.  IMPRESSION: 1. Status post right nephrectomy. 2. The left kidney and bladder are unremarkable.   Electronically Signed By: Gerome Sam III M.D. On: 10/20/2020 12:56  No results found for this or any previous visit.  No results found for this or any previous visit.  No results found for this or any previous visit.   Assessment & Plan:   - 1. Nephrolithiasis - CT RENAL STONE STUDY  2. Urinary retention    No follow-ups on file.  [name], MD  [facility] [place]

## 2021-10-07 NOTE — Progress Notes (Signed)
Simple Catheter Placement  Due to urinary retention patient is present today for a foley cath placement.  Patient was cleaned and prepped in a sterile fashion with betadine. A 16 FR foley catheter was inserted, urine return was noted  , urine was yellow in color.  The balloon was filled with 10cc of sterile water.  A bed bag was attached for drainage.  Patient was given instruction on proper catheter care.  Patient tolerated well, no complications were noted   Performed by: Guss Bunde, CMA  Additional notes/ Follow up: Follow up as scheduled.

## 2021-10-09 ENCOUNTER — Telehealth: Payer: Self-pay

## 2021-10-09 NOTE — Telephone Encounter (Signed)
Patient's sister called with concerns of blood tinged urine after having the catheter placed.  I returned her call to get more information with no answer.  Left voicemail for her to return call.

## 2021-10-14 ENCOUNTER — Ambulatory Visit (INDEPENDENT_AMBULATORY_CARE_PROVIDER_SITE_OTHER): Payer: Medicare Other | Admitting: Physician Assistant

## 2021-10-14 VITALS — BP 102/66 | HR 72

## 2021-10-14 DIAGNOSIS — K5641 Fecal impaction: Secondary | ICD-10-CM

## 2021-10-14 DIAGNOSIS — R339 Retention of urine, unspecified: Secondary | ICD-10-CM

## 2021-10-14 DIAGNOSIS — Z8719 Personal history of other diseases of the digestive system: Secondary | ICD-10-CM

## 2021-10-14 DIAGNOSIS — Z905 Acquired absence of kidney: Secondary | ICD-10-CM

## 2021-10-14 NOTE — Progress Notes (Signed)
Assessment: 1. Urinary retention  2. History of fecal impaction  3. Impacted stool in rectum (HCC)  4. Solitary kidney, acquired    Plan: Discussed CT findings and recommendations to evacuate this large fecal ball from the rectum before attempting voiding trial.  Patient's family contacted his primary care provider who has addressed this in the past for appointment as soon as possible.  They understand he may need gastroenterology intervention.  Once this large bolus has been evacuated, we will see him back for a morning appointment to attempt voiding trial.  Foley care discussed  Chief Complaint: No chief complaint on file.   HPI: [name] is a 61 y.o. male with h/o CP who presents for continued evaluation of urinary obstruction of solitary kidney with hydroureteronephrosis on the left.  He has been 1 week since Foley catheter placed following CT stone study which indicated no evidence of stone burden, but hydroureteronephrosis noted with significant stool burden and large rectal stool bolus.  He has been having some daily passage of stool but not a significant amount. No diarrhea. He has had problems with constipation in the past and takes daily MiraLAX for it.  Family reports that he is tolerating the Foley well and he denies pain. No gross hematuria. He is taking Flomax as Rxd  Portions of the above documentation were copied from a prior visit for review purposes only.  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   PONV (postoperative nausea and vomiting)     PSH: Past Surgical History:  Procedure Laterality Date   COLONOSCOPY WITH PROPOFOL N/A 08/26/2020   Procedure: COLONOSCOPY WITH PROPOFOL;  Surgeon: Vida Rigger, MD;  Location: [facility];  Service:  Endoscopy;  Laterality: N/A;   ESOPHAGOGASTRODUODENOSCOPY (EGD) WITH PROPOFOL N/A 08/26/2020   Procedure: ESOPHAGOGASTRODUODENOSCOPY (EGD) WITH PROPOFOL;  Surgeon: Vida Rigger, MD;  Location: [facility];  Service: Endoscopy;  Laterality: N/A;   IR NEPHROSTOMY PLACEMENT RIGHT  01/03/2019   ROBOT ASSISTED LAPAROSCOPIC NEPHRECTOMY Right 02/24/2019   Procedure: XI ROBOTIC ASSISTED LAPAROSCOPIC NEPHRECTOMY;  Surgeon: Malen Gauze, MD;  Location: [facility];  Service: Urology;  Laterality: Right;  3 HRS   TENDON RELEASE      SH: Social History   Tobacco Use   Smoking status: Never   Smokeless tobacco: Never  Vaping Use   Vaping Use: Never used  Substance Use Topics   Alcohol use: No   Drug use: No    ROS: All other review of systems were reviewed and are negative except what is noted above in HPI  PE: BP 102/66   Pulse 72  GENERAL APPEARANCE:  Well appearing,NAD HEENT:  Atraumatic, normocephalic NECK:  Supple. Trachea midline ABDOMEN:  Soft, non-tender, no masses EXTREMITIES:  Flexion contractures of upper and Les. Motorized wheelchair for mobilization NEUROLOGIC:  Alert and responsive to questions    Results: Laboratory Data: Lab Results  Component Value Date   WBC 9.4 08/26/2020   HGB 8.2 (L) 08/26/2020   HCT 28.6 (L) 08/26/2020   MCV 80.8 08/26/2020   PLT 465 (H) 08/26/2020    Lab Results  Component Value Date   CREATININE 0.80 08/22/2020      Urinalysis    Component Value Date/Time   COLORURINE YELLOW  11/24/2014 1630   APPEARANCEUR CLEAR 11/24/2014 1630   LABSPEC 1.015 11/24/2014 1630   PHURINE 7.5 11/24/2014 1630   GLUCOSEU NEGATIVE 11/24/2014 1630   HGBUR LARGE (A) 11/24/2014 1630   BILIRUBINUR NEGATIVE 11/24/2014 1630   KETONESUR NEGATIVE 11/24/2014 1630   PROTEINUR 30 (A) 11/24/2014 1630   UROBILINOGEN 0.2 11/24/2014 1630   NITRITE POSITIVE (A) 11/24/2014 1630   LEUKOCYTESUR LARGE (A) 11/24/2014 1630    Lab Results  Component Value Date    BACTERIA FEW (A) 11/24/2014    Pertinent Imaging: Results for orders placed during the hospital encounter of 02/24/19  DG Abd 1 View  Narrative CLINICAL DATA:  Abdominal distension.  EXAM: ABDOMEN - 1 VIEW  COMPARISON:  CT dated 12/02/2018  FINDINGS: There is gaseous distention of loops of small bowel and colon scattered throughout the abdomen. There is a drainage catheter projecting over the patient's right lower quadrant and midline abdomen. There is a large amount of stool at the level of the rectum.  IMPRESSION: 1. There are gaseous distended loops of small bowel and colon scattered throughout the abdomen. 2. Large amount of stool at the level of the rectum suggesting fecal impaction. 3. A drainage catheter projects over the patient's low abdomen. Correlation with history is recommended.   Electronically Signed By: Katherine Mantle M.D. On: 02/25/2019 21:38  No results found for this or any previous visit.  No results found for this or any previous visit.  No results found for this or any previous visit.  Results for orders placed during the hospital encounter of 10/19/20  Ultrasound renal complete  Narrative CLINICAL DATA:  Nephrolithiasis history.  Right nephrectomy in 2020.  EXAM: RENAL / URINARY TRACT ULTRASOUND COMPLETE  COMPARISON:  None.  FINDINGS: Right Kidney:  Surgically absent.  Left Kidney:  Renal measurements: 10.4 x 5.4 x 5.2 cm = volume: 151 mL. Echogenicity within normal limits. No mass or hydronephrosis visualized.  Bladder:  Appears normal for degree of bladder distention.  Other:  None.  IMPRESSION: 1. Status post right nephrectomy. 2. The left kidney and bladder are unremarkable.   Electronically Signed By: Gerome Sam III M.D. On: 10/20/2020 12:56  No results found for this or any previous visit.  No results found for this or any previous visit.  Results for orders placed in visit on 10/07/21  CT  RENAL STONE STUDY  Narrative CLINICAL DATA:  61 year old male presents to the emergency department with right leg pain. Has history of right-sided nephrolithiasis status post right nephrectomy  EXAM: CT ABDOMEN AND PELVIS WITHOUT CONTRAST  TECHNIQUE: Multidetector CT imaging of the abdomen and pelvis was performed following the standard protocol without IV contrast.  RADIATION DOSE REDUCTION: This exam was performed according to the departmental dose-optimization program which includes automated exposure control, adjustment of the mA and/or kV according to patient size and/or use of iterative reconstruction technique.  COMPARISON:  September 27, 2021, December 02, 2018  FINDINGS: Lower chest: Minimal dependent atelectasis.  No acute abnormality.  Hepatobiliary: No focal liver abnormality is seen. No gallstones, gallbladder wall thickening, or biliary dilatation.  Pancreas: Unremarkable. No pancreatic ductal dilatation or surrounding inflammatory changes.  Spleen: Normal in size without focal abnormality.  Adrenals/Urinary Tract: The adrenal glands are unremarkable. Status post right total nephrectomy. Moderate to severe left hydroureteronephrosis. Severe distention of the urinary bladder. No radiopaque nephrolithiasis.  Stomach/Bowel: Stomach is within normal limits. Large rectal stool ball. No evidence of bowel wall thickening or inflammatory changes.  Vascular/Lymphatic: No  significant vascular findings are present. No enlarged abdominal or pelvic lymph nodes.  Reproductive: Coarse calcifications in the prostate gland.  Other: No abdominal wall hernia or abnormality. No abdominopelvic ascites.  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 No acute or significant osseous findings.  IMPRESSION: 1. Findings suggestive of bladder outlet obstruction including marked distention of the bladder and moderate to severe left hydroureteronephrosis. No  nephrolithiasis. 2. Large rectal stool ball.   Electronically Signed By: Jacob Moores M.D. On: 10/07/2021 13:23  No results found for this or any previous visit (from the past 24 hour(s)).

## 2021-10-15 ENCOUNTER — Encounter: Payer: Self-pay | Admitting: Urology

## 2021-10-15 NOTE — Patient Instructions (Signed)
Acute Urinary Retention, Male  Acute urinary retention is when a person cannot pee (urinate) at all, or can only pee a little. This can come on all of a sudden. If it is not treated, it can lead to kidney problems or other serious problems. What are the causes? A problem with the tube that drains the bladder (urethra). Problems with the nerves in the bladder. Tumors. Certain medicines. An infection. Having trouble pooping (constipation). What increases the risk? Older men are more at risk because their prostate gland may become larger as they age. Other conditions also can increase risk. These include: Diseases, such as multiple sclerosis. Injury to the spinal cord. Diabetes. A condition that affects the way the brain works, such as dementia. Holding back urine due to trauma or because you do not want to use the bathroom. What are the signs or symptoms? Trouble peeing. Pain in the lower belly. How is this treated? Treatment for this condition may include: Medicines. Placing a thin, germ-free tube (catheter) into the bladder to drain pee out of the body. Therapy to treat mental health conditions. Treatment for conditions that may cause this. If needed, you may be treated in the hospital for kidney problems or to manage other problems. Follow these instructions at home: Medicines Take over-the-counter and prescription medicines only as told by your doctor. Ask your doctor what medicines you should stay away from. If you were given an antibiotic medicine, take it as told by your doctor. Do not stop taking it, even if you start to feel better. General instructions Do not smoke or use any products that contain nicotine or tobacco. If you need help quitting, ask your doctor. Drink enough fluid to keep your pee pale yellow. If you were sent home with a tube that drains the bladder, take care of it as told by your doctor. Watch for changes in your symptoms. Tell your doctor about them. If  told, keep track of changes in your blood pressure at home. Tell your doctor about them. Keep all follow-up visits. Contact a doctor if: You have spasms in your bladder that you cannot stop. You leak pee when you have spasms. Get help right away if: You have chills or a fever. You have blood in your pee. You have a tube that drains pee from the bladder and these things happen: The tube stops draining pee. The tube falls out. Summary Acute urinary retention is when you cannot pee at all or you pee too little. If this condition is not treated, it can lead to kidney problems or other serious problems. If you were sent home with a tube (catheter) that drains the bladder, take care of it as told by your doctor. Watch for changes in your symptoms. Tell your doctor about them. This information is not intended to replace advice given to you by your health care provider. Make sure you discuss any questions you have with your health care provider. Document Revised: 11/02/2019 Document Reviewed: 11/02/2019 Elsevier Patient Education  2023 [employer]  

## 2021-10-21 ENCOUNTER — Telehealth: Payer: Self-pay

## 2021-10-21 NOTE — Telephone Encounter (Signed)
FYI Pt's sister is asking if patient needs to have another CT before attempting voiding trial to remove catheter.  Verbal Noreene Larsson, we do not need any imaging she just wanted to be sure patient's bowels were moving before attempting the voiding trial.  His impaction could potentially cause him to fail the voiding trial.  Sister made aware and states that patient's bowels are moving regularly.  She also had questions as to how the voiding trial will work since patient is not able to communicate whether or not he as to go to the bathroom.

## 2021-10-23 ENCOUNTER — Ambulatory Visit: Payer: Medicare Other | Admitting: Physician Assistant

## 2021-10-23 DIAGNOSIS — N1339 Other hydronephrosis: Secondary | ICD-10-CM

## 2021-10-23 DIAGNOSIS — Z905 Acquired absence of kidney: Secondary | ICD-10-CM

## 2021-10-23 DIAGNOSIS — K5641 Fecal impaction: Secondary | ICD-10-CM

## 2021-10-23 DIAGNOSIS — R339 Retention of urine, unspecified: Secondary | ICD-10-CM | POA: Diagnosis not present

## 2021-10-23 DIAGNOSIS — N200 Calculus of kidney: Secondary | ICD-10-CM

## 2021-10-23 MED ORDER — SILODOSIN 8 MG PO CAPS: 8.0000 mg | ORAL_CAPSULE | Freq: Every day | ORAL | 11 refills | Status: AC

## 2021-10-23 NOTE — Addendum Note (Signed)
Addended by: Christoper Fabian R on: 10/23/2021 04:48 PM   Modules accepted: Orders

## 2021-10-23 NOTE — Progress Notes (Addendum)
Fill and Pull Catheter Removal  Patient is present today for a catheter removal.  Patient was cleaned and prepped in a sterile fashion 300 ml of sterile water/ saline was instilled into the bladder when the patient felt the urge to urinate. 10 ml of water was then drained from the balloon.  A 16 FR foley cath was removed from the bladder no complications were noted .  Patient as then given some time to void on their own.  Patient can void  50 ml on their own after some time.  Patient tolerated well.  Performed by: Kennyth Lose, CMA  Follow up/ Additional notes: Follow up as schedule

## 2021-10-23 NOTE — Progress Notes (Signed)
Simple Catheter Placement  Due to urinary retention patient is present today for a foley cath placement.  Patient was cleaned and prepped in a sterile fashion with betadine. A 16 FR foley catheter was inserted, urine return was noted  409 ml, urine was Yellow in color.  The balloon was filled with 10cc of sterile water.  A night bag was attached for drainage.  Patient was given instruction on proper catheter care.  Patient tolerated well, no complications were noted    Pt here today for bladder scan. Bladder was scanned and 409 was visualized.   Performed by: Kennyth Lose, CMA  Additional follow up Follow up as schedule

## 2021-10-23 NOTE — Addendum Note (Signed)
Addended by: Weyman Croon A on: 10/23/2021 05:16 PM   Modules accepted: Orders

## 2021-10-23 NOTE — Progress Notes (Addendum)
Assessment: 1. Other hydronephrosis - Ultrasound renal complete; Future - BLADDER SCAN AMB NON-IMAGING  2. Solitary kidney, acquired - Ultrasound renal complete; Future  3. Nephrolithiasis  4. Impacted stool in rectum [facility])    Plan: Voiding trial successful. Renal US in 2 weeks and FU in 1 month for PVR. If he is unable to void by mid afternoon, he will return for replacement of foley. Continue Flomax. Pt's sister wants to keep foley out as long as possible to decrease risk of UTI. She will consider SP tube if retention/hydro remain an issue. GI to eval constipation. Continue stone prevention diet.  12:29 PM Pt returned to the office unable to void since VT this morning. PVR=457ml Foley replaced. Will DC Flomax and start Rapaflo. Will try voiding trial again after colonoscopy and clearing of pt's stool retention. Appt scheduled for Nov 5th and will attempt to move this up. FU in 1 month for cath change  Chief Complaint: No chief complaint on file.   HPI: [name] is a 61 y.o. male who presents for continued evaluation of retention and scheduled for VT. Pt's sister states he has been doing well with foley and has colonoscopy scheduled to eval recent stool retention/impaction. He is having nl Bms since last vistit.  No fever, symptoms of illness, gross hematuria.  10/14/21 [name] is a 61 y.o. male with h/o CP who presents for continued evaluation of urinary obstruction of solitary kidney with hydroureteronephrosis on the left.  He has been 1 week since Foley catheter placed following CT stone study which indicated no evidence of stone burden, but hydroureteronephrosis noted with significant stool burden and large rectal stool bolus.  He has been having some daily passage of stool but not a significant amount. No diarrhea. He has had problems with constipation in the past and takes daily MiraLAX for it.  Family reports that he is tolerating the Foley well and he denies  pain. No gross hematuria. He is taking Flomax as Rxd  Portions of the above documentation were copied from a prior visit for review purposes only.  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   PONV (postoperative nausea and vomiting)     PSH: Past Surgical History:  Procedure Laterality Date   COLONOSCOPY WITH PROPOFOL N/A 08/26/2020   Procedure: COLONOSCOPY WITH PROPOFOL;  Surgeon: Vida Rigger, MD;  Location: [facility];  Service: Endoscopy;  Laterality: N/A;   ESOPHAGOGASTRODUODENOSCOPY (EGD) WITH PROPOFOL N/A 08/26/2020   Procedure: ESOPHAGOGASTRODUODENOSCOPY (EGD) WITH PROPOFOL;  Surgeon: Vida Rigger, MD;  Location: [facility];  Service: Endoscopy;  Laterality: N/A;   IR NEPHROSTOMY PLACEMENT RIGHT  01/03/2019   ROBOT ASSISTED LAPAROSCOPIC NEPHRECTOMY Right 02/24/2019   Procedure: XI ROBOTIC ASSISTED LAPAROSCOPIC NEPHRECTOMY;  Surgeon: Malen Gauze, MD;  Location: [facility];  Service: Urology;  Laterality: Right;  3 HRS   TENDON RELEASE      SH: Social History   Tobacco Use   Smoking status: Never   Smokeless tobacco: Never  Vaping Use   Vaping Use: Never used  Substance Use Topics   Alcohol use: No   Drug use: No    ROS: All other review of systems were reviewed and are negative except what is noted above in  HPI  PE: There were no vitals taken for this visit. GENERAL APPEARANCE:  Well appearing,, usual state of pleasant demeanor.  HEENT:  Atraumatic, normocephalic NECK:  Supple. Trachea midline ABDOMEN:  Soft, non-tender, no masses EXTREMITIES: Extremity contractures noted NEUROLOGIC:  Alert and responsive to commands SKIN:  Warm, dry, and intact   Results: Laboratory Data: Lab Results  Component Value Date   WBC 9.4 08/26/2020   HGB 8.2 (L)  08/26/2020   HCT 28.6 (L) 08/26/2020   MCV 80.8 08/26/2020   PLT 465 (H) 08/26/2020    Lab Results  Component Value Date   CREATININE 0.80 08/22/2020    No results found for: "HGBA1C"  Urinalysis    Component Value Date/Time   COLORURINE YELLOW 11/24/2014 1630   APPEARANCEUR CLEAR 11/24/2014 1630   LABSPEC 1.015 11/24/2014 1630   PHURINE 7.5 11/24/2014 1630   GLUCOSEU NEGATIVE 11/24/2014 1630   HGBUR LARGE (A) 11/24/2014 1630   BILIRUBINUR NEGATIVE 11/24/2014 1630   KETONESUR NEGATIVE 11/24/2014 1630   PROTEINUR 30 (A) 11/24/2014 1630   UROBILINOGEN 0.2 11/24/2014 1630   NITRITE POSITIVE (A) 11/24/2014 1630   LEUKOCYTESUR LARGE (A) 11/24/2014 1630    Lab Results  Component Value Date   BACTERIA FEW (A) 11/24/2014    Pertinent Imaging: Results for orders placed during the hospital encounter of 02/24/19  DG Abd 1 View  Narrative CLINICAL DATA:  Abdominal distension.  EXAM: ABDOMEN - 1 VIEW  COMPARISON:  CT dated 12/02/2018  FINDINGS: There is gaseous distention of loops of small bowel and colon scattered throughout the abdomen. There is a drainage catheter projecting over the patient's right lower quadrant and midline abdomen. There is a large amount of stool at the level of the rectum.  IMPRESSION: 1. There are gaseous distended loops of small bowel and colon scattered throughout the abdomen. 2. Large amount of stool at the level of the rectum suggesting fecal impaction. 3. A drainage catheter projects over the patient's low abdomen. Correlation with history is recommended.   Electronically Signed By: Katherine Mantle M.D. On: 02/25/2019 21:38  No results found for this or any previous visit.  No results found for this or any previous visit.  No results found for this or any previous visit.  Results for orders placed during the hospital encounter of 10/19/20  Ultrasound renal complete  Narrative CLINICAL DATA:  Nephrolithiasis history.   Right nephrectomy in 2020.  EXAM: RENAL / URINARY TRACT ULTRASOUND COMPLETE  COMPARISON:  None.  FINDINGS: Right Kidney:  Surgically absent.  Left Kidney:  Renal measurements: 10.4 x 5.4 x 5.2 cm = volume: 151 mL. Echogenicity within normal limits. No mass or hydronephrosis visualized.  Bladder:  Appears normal for degree of bladder distention.  Other:  None.  IMPRESSION: 1. Status post right nephrectomy. 2. The left kidney and bladder are unremarkable.   Electronically Signed By: Gerome Sam III M.D. On: 10/20/2020 12:56  No results found for this or any previous visit.  No results found for this or any previous visit.  Results for orders placed in visit on 10/07/21  CT RENAL STONE STUDY  Narrative CLINICAL DATA:  61 year old male presents to the emergency department with right leg pain. Has history of right-sided nephrolithiasis status post right nephrectomy  EXAM: CT ABDOMEN AND PELVIS WITHOUT CONTRAST  TECHNIQUE: Multidetector CT imaging of the abdomen and pelvis was performed following the standard protocol without IV contrast.  RADIATION DOSE REDUCTION: This exam was performed according to the departmental dose-optimization  program which includes automated exposure control, adjustment of the mA and/or kV according to patient size and/or use of iterative reconstruction technique.  COMPARISON:  September 27, 2021, December 02, 2018  FINDINGS: Lower chest: Minimal dependent atelectasis.  No acute abnormality.  Hepatobiliary: No focal liver abnormality is seen. No gallstones, gallbladder wall thickening, or biliary dilatation.  Pancreas: Unremarkable. No pancreatic ductal dilatation or surrounding inflammatory changes.  Spleen: Normal in size without focal abnormality.  Adrenals/Urinary Tract: The adrenal glands are unremarkable. Status post right total nephrectomy. Moderate to severe left hydroureteronephrosis. Severe distention of the  urinary bladder. No radiopaque nephrolithiasis.  Stomach/Bowel: Stomach is within normal limits. Large rectal stool ball. No evidence of bowel wall thickening or inflammatory changes.  Vascular/Lymphatic: No significant vascular findings are present. No enlarged abdominal or pelvic lymph nodes.  Reproductive: Coarse calcifications in the prostate gland.  Other: No abdominal wall hernia or abnormality. No abdominopelvic ascites.  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 No acute or significant osseous findings.  IMPRESSION: 1. Findings suggestive of bladder outlet obstruction including marked distention of the bladder and moderate to severe left hydroureteronephrosis. No nephrolithiasis. 2. Large rectal stool ball.   Electronically Signed By: Jacob Moores M.D. On: 10/07/2021 13:23  No results found for this or any previous visit (from the past 24 hour(s)).

## 2021-11-06 ENCOUNTER — Ambulatory Visit (HOSPITAL_COMMUNITY)
Admission: RE | Admit: 2021-11-06 | Discharge: 2021-11-06 | Disposition: A | Discharge status: Home / Self Care | Payer: Medicare Other | Source: Ambulatory Visit | Attending: Physician Assistant | Admitting: Physician Assistant

## 2021-11-06 DIAGNOSIS — Z905 Acquired absence of kidney: Secondary | ICD-10-CM | POA: Insufficient documentation

## 2021-11-06 DIAGNOSIS — N1330 Unspecified hydronephrosis: Secondary | ICD-10-CM | POA: Diagnosis not present

## 2021-11-06 DIAGNOSIS — N1339 Other hydronephrosis: Secondary | ICD-10-CM | POA: Insufficient documentation

## 2021-11-11 ENCOUNTER — Telehealth: Payer: Self-pay

## 2021-11-11 NOTE — Telephone Encounter (Signed)
Made pt's sister aware that his renal US show no more hydro of the kidney and that foley os doing it's job to protect the patient's kidney. Made pt sister aware that catheter removal vs cath change at patient next scheduled visit. Patient's sister voiced understanding.

## 2021-11-11 NOTE — Telephone Encounter (Signed)
-----   Message from Reynaldo Minium, [place] sent at 11/11/2021 10:33 AM EDT ----- Please let pt's sister know that the renal US shows no more hydro of the kidney so the foley is doing it's job to protect his remaining kidney. This is great news! We will discuss catheter removal vs cath changes at his next visit. ----- Message ----- From: Interface, Rad Results In Sent: 11/08/2021  10:12 AM EDT To: Reynaldo Minium, PA-C

## 2021-11-20 ENCOUNTER — Ambulatory Visit: Payer: Medicare Other | Admitting: Physician Assistant

## 2021-11-20 VITALS — BP 158/98 | HR 79

## 2021-11-20 DIAGNOSIS — N1339 Other hydronephrosis: Secondary | ICD-10-CM | POA: Diagnosis not present

## 2021-11-20 DIAGNOSIS — Z8719 Personal history of other diseases of the digestive system: Secondary | ICD-10-CM

## 2021-11-20 DIAGNOSIS — N289 Disorder of kidney and ureter, unspecified: Secondary | ICD-10-CM | POA: Diagnosis not present

## 2021-11-20 DIAGNOSIS — R339 Retention of urine, unspecified: Secondary | ICD-10-CM

## 2021-11-20 LAB — MICROSCOPIC EXAMINATION

## 2021-11-20 LAB — URINALYSIS, ROUTINE W REFLEX MICROSCOPIC
Bilirubin, UA: NEGATIVE
Glucose, UA: NEGATIVE
Ketones, UA: NEGATIVE
Nitrite, UA: NEGATIVE
Specific Gravity, UA: 1.005 (ref 1.005–1.030)
Urobilinogen, Ur: 0.2 mg/dL (ref 0.2–1.0)
pH, UA: 7 (ref 5.0–7.5)

## 2021-11-20 NOTE — Progress Notes (Signed)
Assessment: 1. Other hydronephrosis - Bladder Voiding Trial  2. Nonfunctioning kidney  3. Urinary retention  4. History of fecal impaction    Plan: Patient is unable to void.  Foley exchange today and will follow-up in 1 month for Foley exchange and sterile urine collection for UA.  Following colonoscopy and visit with GI in early November, patient will return for possible voiding trial.  We will discontinue Rapaflo at this time until 2 to 3 weeks before November visit.   Chief Complaint: No chief complaint on file.   HPI: [name] is a 61 y.o. male who presents for continued evaluation of urinary retention, solitary kidney with history of hydroureteronephrosis.  Patient failed voiding trial at his last visit.  He has remained on Rapaflo since. Colonoscopy scheduled in November. Renal US on 828 after Foley placement indicates hydro of solitary kidney has resolved. Sister states he has done well with foley and urine has been clear.  UA- RBCs, no bacteria or WBCs  10/23/21 [name] is a 61 y.o. male who presents for continued evaluation of retention and scheduled for VT. Pt's sister states he has been doing well with foley and has colonoscopy scheduled to eval recent stool retention/impaction. He is having nl Bms since last vistit.  No fever, symptoms of illness, gross hematuria.   10/14/21 [name] is a 61 y.o. male with h/o CP who presents for continued evaluation of urinary obstruction of solitary kidney with hydroureteronephrosis on the left.  He has been 1 week since Foley catheter placed following CT stone study which indicated no evidence of stone burden, but hydroureteronephrosis noted with significant stool burden and large rectal stool bolus.  He has been having some daily passage of stool but not a significant amount. No diarrhea. He has had problems with constipation in the past and takes daily MiraLAX for it.  Family reports that he is tolerating the Foley  well and he denies pain. No gross hematuria. He is taking Flomax as Rxd   Portions of the above documentation were copied from a prior visit for review purposes only.  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   PONV (postoperative nausea and vomiting)     PSH: Past Surgical History:  Procedure Laterality Date   COLONOSCOPY WITH PROPOFOL N/A 08/26/2020   Procedure: COLONOSCOPY WITH PROPOFOL;  Surgeon: Vida Rigger, MD;  Location: [facility];  Service: Endoscopy;  Laterality: N/A;   ESOPHAGOGASTRODUODENOSCOPY (EGD) WITH PROPOFOL N/A 08/26/2020   Procedure: ESOPHAGOGASTRODUODENOSCOPY (EGD) WITH PROPOFOL;  Surgeon: Vida Rigger, MD;  Location: [facility];  Service: Endoscopy;  Laterality: N/A;   IR NEPHROSTOMY PLACEMENT RIGHT  01/03/2019   ROBOT ASSISTED LAPAROSCOPIC NEPHRECTOMY Right 02/24/2019   Procedure: XI ROBOTIC ASSISTED LAPAROSCOPIC NEPHRECTOMY;  Surgeon: Malen Gauze, MD;  Location: [facility];  Service: Urology;  Laterality: Right;  3 HRS   TENDON RELEASE      SH: Social History   Tobacco Use   Smoking status: Never   Smokeless tobacco: Never  Vaping Use   Vaping Use: Never used  Substance Use Topics   Alcohol use: No   Drug use: No    ROS: All other review of systems were reviewed and are negative except what is  noted above in HPI  PE: BP (!) 158/98   Pulse 79  GENERAL APPEARANCE:  NAD HEENT:  Atraumatic, normocephalic NECK:  Supple. Trachea midline ABDOMEN:  Soft, non-tender, no masses EXTREMITIES: contractures stable NEUROLOGIC:  Alert and pleasant MENTAL STATUS:  non-verbal per his normal BACK:  Non-tender to palpation, No CVAT SKIN:  Warm, dry, and intact   Results: Laboratory Data: Lab Results  Component Value Date   WBC 9.4 08/26/2020    HGB 8.2 (L) 08/26/2020   HCT 28.6 (L) 08/26/2020   MCV 80.8 08/26/2020   PLT 465 (H) 08/26/2020    Lab Results  Component Value Date   CREATININE 0.80 08/22/2020      Urinalysis    Component Value Date/Time   COLORURINE YELLOW 11/24/2014 1630   APPEARANCEUR CLEAR 11/24/2014 1630   LABSPEC 1.015 11/24/2014 1630   PHURINE 7.5 11/24/2014 1630   GLUCOSEU NEGATIVE 11/24/2014 1630   HGBUR LARGE (A) 11/24/2014 1630   BILIRUBINUR NEGATIVE 11/24/2014 1630   KETONESUR NEGATIVE 11/24/2014 1630   PROTEINUR 30 (A) 11/24/2014 1630   UROBILINOGEN 0.2 11/24/2014 1630   NITRITE POSITIVE (A) 11/24/2014 1630   LEUKOCYTESUR LARGE (A) 11/24/2014 1630    Lab Results  Component Value Date   BACTERIA FEW (A) 11/24/2014    Pertinent Imaging: Results for orders placed during the hospital encounter of 02/24/19  DG Abd 1 View  Narrative CLINICAL DATA:  Abdominal distension.  EXAM: ABDOMEN - 1 VIEW  COMPARISON:  CT dated 12/02/2018  FINDINGS: There is gaseous distention of loops of small bowel and colon scattered throughout the abdomen. There is a drainage catheter projecting over the patient's right lower quadrant and midline abdomen. There is a large amount of stool at the level of the rectum.  IMPRESSION: 1. There are gaseous distended loops of small bowel and colon scattered throughout the abdomen. 2. Large amount of stool at the level of the rectum suggesting fecal impaction. 3. A drainage catheter projects over the patient's low abdomen. Correlation with history is recommended.   Electronically Signed By: Katherine Mantle M.D. On: 02/25/2019 21:38  No results found for this or any previous visit.  No results found for this or any previous visit.  No results found for this or any previous visit.  Results for orders placed during the hospital encounter of 11/06/21  Ultrasound renal complete  Narrative CLINICAL DATA:  61 year old male for follow-up of  LEFT hydronephrosis. History of RIGHT nephrectomy.  EXAM: RENAL / URINARY TRACT ULTRASOUND COMPLETE  COMPARISON:  10/07/2021 CT and prior studies  FINDINGS: Right Kidney:  Not visualized compatible with nephrectomy.  Left Kidney:  Renal measurements: 11.3 x 5.3 x 4.7 cm. = volume: 146 mL. Echogenicity within normal limits. No mass or hydronephrosis visualized.  Bladder:  A Foley catheter is present in the region of the bladder.  Other:  None.  IMPRESSION: 1. Interval resolution of LEFT hydronephrosis since 10/07/2021 CT. No LEFT renal abnormalities identified. 2. Status post RIGHT nephrectomy. 3. Foley catheter.   Electronically Signed By: Harmon Pier M.D. On: 11/08/2021 10:10  No valid procedures specified. No results found for this or any previous visit.  Results for orders placed in visit on 10/07/21  CT RENAL STONE STUDY  Narrative CLINICAL DATA:  61 year old male presents to the emergency department with right leg pain. Has history of right-sided nephrolithiasis status post right nephrectomy  EXAM: CT ABDOMEN AND PELVIS WITHOUT CONTRAST  TECHNIQUE: Multidetector CT imaging of the abdomen and pelvis was performed  following the standard protocol without IV contrast.  RADIATION DOSE REDUCTION: This exam was performed according to the departmental dose-optimization program which includes automated exposure control, adjustment of the mA and/or kV according to patient size and/or use of iterative reconstruction technique.  COMPARISON:  September 27, 2021, December 02, 2018  FINDINGS: Lower chest: Minimal dependent atelectasis.  No acute abnormality.  Hepatobiliary: No focal liver abnormality is seen. No gallstones, gallbladder wall thickening, or biliary dilatation.  Pancreas: Unremarkable. No pancreatic ductal dilatation or surrounding inflammatory changes.  Spleen: Normal in size without focal abnormality.  Adrenals/Urinary Tract: The adrenal  glands are unremarkable. Status post right total nephrectomy. Moderate to severe left hydroureteronephrosis. Severe distention of the urinary bladder. No radiopaque nephrolithiasis.  Stomach/Bowel: Stomach is within normal limits. Large rectal stool ball. No evidence of bowel wall thickening or inflammatory changes.  Vascular/Lymphatic: No significant vascular findings are present. No enlarged abdominal or pelvic lymph nodes.  Reproductive: Coarse calcifications in the prostate gland.  Other: No abdominal wall hernia or abnormality. No abdominopelvic ascites.  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 No acute or significant osseous findings.  IMPRESSION: 1. Findings suggestive of bladder outlet obstruction including marked distention of the bladder and moderate to severe left hydroureteronephrosis. No nephrolithiasis. 2. Large rectal stool ball.   Electronically Signed By: Beryle Flock M.D. On: 10/07/2021 13:23  No results found for this or any previous visit (from the past 24 hour(s)).

## 2021-11-20 NOTE — Patient Instructions (Signed)
Hold Rapaflo until 2-3 weeks before November voiding trial

## 2021-11-20 NOTE — Progress Notes (Signed)
Fill and Pull Catheter Removal  Patient is present today for a catheter removal.  Patient was cleaned and prepped in a sterile fashion 300 ml of sterile water/ saline was instilled into the bladder when the patient felt the urge to urinate. 10 ml of water was then drained from the balloon.  A 16 FR foley cath was removed from the bladder no complications were noted .  Patient as then given some time to void on their own.  Patient cannot void  0 ml on their own after some time.  Patient tolerated well.  Performed by: Marisue Brooklyn, CMA  Follow up/ Additional notes: Follow up as scheduled

## 2021-11-20 NOTE — Progress Notes (Signed)
Simple Catheter Placement  Due to urinary retention patient is present today for a foley cath placement.  Patient was cleaned and prepped in a sterile fashion with betadine. A 16 FR foley catheter was inserted, urine return was noted  240ml, urine was yellow in color.  The balloon was filled with 10cc of sterile water.  A bed bag was attached for drainage. Patient was given instruction on proper catheter care.  Patient tolerated well, no complications were noted   Performed by: Levi Aland, CMA  Additional notes/ Follow up: Follow up as scheduled.

## 2021-12-18 ENCOUNTER — Ambulatory Visit (INDEPENDENT_AMBULATORY_CARE_PROVIDER_SITE_OTHER): Payer: Medicare Other | Admitting: Urology

## 2021-12-18 DIAGNOSIS — N289 Disorder of kidney and ureter, unspecified: Secondary | ICD-10-CM | POA: Diagnosis not present

## 2021-12-18 DIAGNOSIS — R339 Retention of urine, unspecified: Secondary | ICD-10-CM | POA: Diagnosis not present

## 2021-12-18 NOTE — Progress Notes (Signed)
Cath Change/ Replacement  Patient is present today for a catheter change due to urinary retention.  53ml of water was removed from the balloon, a 16FR foley cath was removed without difficulty.  Patient was cleaned and prepped in a sterile fashion with betadine and 2% lidocaine jelly was instilled into the urethra. A 16 FR foley cath was replaced into the bladder, no complications were noted. Urine return was noted 138ml and urine was yellow in color. The balloon was filled with 44ml of sterile water. A bed bag was attached for drainage.  A night bag was also given to the patient and patient was given instruction on how to change from one bag to another. Patient was given proper instruction on catheter care.    Performed by: Levi Aland, CMA  Follow up: Follow up as scheduled.

## 2021-12-19 LAB — URINALYSIS, ROUTINE W REFLEX MICROSCOPIC
Bilirubin, UA: NEGATIVE
Glucose, UA: NEGATIVE
Ketones, UA: NEGATIVE
Nitrite, UA: NEGATIVE
Specific Gravity, UA: 1.015 (ref 1.005–1.030)
Urobilinogen, Ur: 2 mg/dL — ABNORMAL HIGH (ref 0.2–1.0)
pH, UA: 6.5 (ref 5.0–7.5)

## 2021-12-19 LAB — MICROSCOPIC EXAMINATION: WBC, UA: >30 /hpf — AB (ref 0–5)

## 2021-12-20 LAB — URINE CULTURE

## 2021-12-24 NOTE — Progress Notes (Signed)
Letter sent.

## 2022-01-04 ENCOUNTER — Emergency Department (HOSPITAL_COMMUNITY): Payer: Medicare Other

## 2022-01-04 ENCOUNTER — Other Ambulatory Visit: Payer: Self-pay

## 2022-01-04 ENCOUNTER — Encounter (HOSPITAL_COMMUNITY): Payer: Self-pay

## 2022-01-04 ENCOUNTER — Inpatient Hospital Stay (HOSPITAL_COMMUNITY)
Admission: EM | Admit: 2022-01-04 | Discharge: 2022-01-13 | DRG: 539 | Disposition: A | Discharge status: Home Health | Payer: Medicare Other | Attending: Internal Medicine | Admitting: Internal Medicine

## 2022-01-04 DIAGNOSIS — M4628 Osteomyelitis of vertebra, sacral and sacrococcygeal region: Secondary | ICD-10-CM | POA: Diagnosis not present

## 2022-01-04 DIAGNOSIS — R339 Retention of urine, unspecified: Principal | ICD-10-CM

## 2022-01-04 DIAGNOSIS — Z993 Dependence on wheelchair: Secondary | ICD-10-CM

## 2022-01-04 DIAGNOSIS — G800 Spastic quadriplegic cerebral palsy: Secondary | ICD-10-CM

## 2022-01-04 DIAGNOSIS — L89324 Pressure ulcer of left buttock, stage 4: Secondary | ICD-10-CM | POA: Diagnosis present

## 2022-01-04 DIAGNOSIS — Z66 Do not resuscitate: Secondary | ICD-10-CM | POA: Diagnosis not present

## 2022-01-04 DIAGNOSIS — Z905 Acquired absence of kidney: Secondary | ICD-10-CM | POA: Diagnosis not present

## 2022-01-04 DIAGNOSIS — Y738 Miscellaneous gastroenterology and urology devices associated with adverse incidents, not elsewhere classified: Secondary | ICD-10-CM | POA: Diagnosis present

## 2022-01-04 DIAGNOSIS — Y846 Urinary catheterization as the cause of abnormal reaction of the patient, or of later complication, without mention of misadventure at the time of the procedure: Secondary | ICD-10-CM | POA: Diagnosis present

## 2022-01-04 DIAGNOSIS — L89150 Pressure ulcer of sacral region, unstageable: Secondary | ICD-10-CM | POA: Diagnosis not present

## 2022-01-04 DIAGNOSIS — T148XXA Other injury of unspecified body region, initial encounter: Secondary | ICD-10-CM

## 2022-01-04 DIAGNOSIS — G839 Paralytic syndrome, unspecified: Secondary | ICD-10-CM | POA: Diagnosis present

## 2022-01-04 DIAGNOSIS — K5641 Fecal impaction: Secondary | ICD-10-CM | POA: Diagnosis not present

## 2022-01-04 DIAGNOSIS — K5289 Other specified noninfective gastroenteritis and colitis: Secondary | ICD-10-CM | POA: Diagnosis present

## 2022-01-04 DIAGNOSIS — L89320 Pressure ulcer of left buttock, unstageable: Secondary | ICD-10-CM | POA: Diagnosis not present

## 2022-01-04 DIAGNOSIS — N390 Urinary tract infection, site not specified: Secondary | ICD-10-CM | POA: Diagnosis present

## 2022-01-04 DIAGNOSIS — L98499 Non-pressure chronic ulcer of skin of other sites with unspecified severity: Secondary | ICD-10-CM | POA: Diagnosis not present

## 2022-01-04 DIAGNOSIS — R944 Abnormal results of kidney function studies: Secondary | ICD-10-CM | POA: Diagnosis not present

## 2022-01-04 DIAGNOSIS — L089 Local infection of the skin and subcutaneous tissue, unspecified: Secondary | ICD-10-CM | POA: Diagnosis not present

## 2022-01-04 DIAGNOSIS — L89329 Pressure ulcer of left buttock, unspecified stage: Secondary | ICD-10-CM

## 2022-01-04 DIAGNOSIS — M869 Osteomyelitis, unspecified: Secondary | ICD-10-CM | POA: Diagnosis present

## 2022-01-04 DIAGNOSIS — N3000 Acute cystitis without hematuria: Secondary | ICD-10-CM | POA: Diagnosis not present

## 2022-01-04 DIAGNOSIS — L89159 Pressure ulcer of sacral region, unspecified stage: Secondary | ICD-10-CM

## 2022-01-04 DIAGNOSIS — S31829A Unspecified open wound of left buttock, initial encounter: Secondary | ICD-10-CM | POA: Diagnosis not present

## 2022-01-04 DIAGNOSIS — B9620 Unspecified Escherichia coli [E. coli] as the cause of diseases classified elsewhere: Secondary | ICD-10-CM | POA: Diagnosis present

## 2022-01-04 DIAGNOSIS — K5900 Constipation, unspecified: Secondary | ICD-10-CM | POA: Diagnosis not present

## 2022-01-04 DIAGNOSIS — R7989 Other specified abnormal findings of blood chemistry: Secondary | ICD-10-CM | POA: Diagnosis present

## 2022-01-04 DIAGNOSIS — R600 Localized edema: Secondary | ICD-10-CM | POA: Diagnosis not present

## 2022-01-04 DIAGNOSIS — N3289 Other specified disorders of bladder: Secondary | ICD-10-CM | POA: Diagnosis not present

## 2022-01-04 DIAGNOSIS — T83518A Infection and inflammatory reaction due to other urinary catheter, initial encounter: Secondary | ICD-10-CM | POA: Diagnosis not present

## 2022-01-04 DIAGNOSIS — G809 Cerebral palsy, unspecified: Secondary | ICD-10-CM | POA: Diagnosis not present

## 2022-01-04 DIAGNOSIS — M8618 Other acute osteomyelitis, other site: Secondary | ICD-10-CM | POA: Diagnosis not present

## 2022-01-04 DIAGNOSIS — L89154 Pressure ulcer of sacral region, stage 4: Secondary | ICD-10-CM | POA: Diagnosis not present

## 2022-01-04 DIAGNOSIS — B964 Proteus (mirabilis) (morganii) as the cause of diseases classified elsewhere: Secondary | ICD-10-CM | POA: Diagnosis present

## 2022-01-04 DIAGNOSIS — K6389 Other specified diseases of intestine: Secondary | ICD-10-CM | POA: Diagnosis not present

## 2022-01-04 DIAGNOSIS — L8990 Pressure ulcer of unspecified site, unspecified stage: Secondary | ICD-10-CM | POA: Diagnosis present

## 2022-01-04 DIAGNOSIS — M86152 Other acute osteomyelitis, left femur: Secondary | ICD-10-CM | POA: Diagnosis not present

## 2022-01-04 LAB — URINALYSIS, ROUTINE W REFLEX MICROSCOPIC
Bilirubin Urine: NEGATIVE
Glucose, UA: NEGATIVE mg/dL
Hgb urine dipstick: NEGATIVE
Ketones, ur: 5 mg/dL — AB
Nitrite: POSITIVE — AB
Protein, ur: >=300 mg/dL — AB
Specific Gravity, Urine: 1.013 (ref 1.005–1.030)
WBC, UA: >50 WBC/hpf — ABNORMAL HIGH (ref 0–5)
pH: 9 — ABNORMAL HIGH (ref 5.0–8.0)

## 2022-01-04 LAB — CBC WITH DIFFERENTIAL/PLATELET
Abs Immature Granulocytes: 0.08 10*3/uL — ABNORMAL HIGH (ref 0.00–0.07)
Basophils Absolute: 0.1 10*3/uL (ref 0.0–0.1)
Basophils Relative: 0 %
Eosinophils Absolute: 0.1 10*3/uL (ref 0.0–0.5)
Eosinophils Relative: 1 %
HCT: 40 % (ref 39.0–52.0)
Hemoglobin: 12.3 g/dL — ABNORMAL LOW (ref 13.0–17.0)
Immature Granulocytes: 1 %
Lymphocytes Relative: 8 %
Lymphs Abs: 1.3 10*3/uL (ref 0.7–4.0)
MCH: 27.3 pg (ref 26.0–34.0)
MCHC: 30.8 g/dL (ref 30.0–36.0)
MCV: 88.7 fL (ref 80.0–100.0)
Monocytes Absolute: 0.9 10*3/uL (ref 0.1–1.0)
Monocytes Relative: 5 %
Neutro Abs: 13.7 10*3/uL — ABNORMAL HIGH (ref 1.7–7.7)
Neutrophils Relative %: 85 %
Platelets: 502 10*3/uL — ABNORMAL HIGH (ref 150–400)
RBC: 4.51 MIL/uL (ref 4.22–5.81)
RDW: 12.7 % (ref 11.5–15.5)
WBC: 16.2 10*3/uL — ABNORMAL HIGH (ref 4.0–10.5)
nRBC: 0 % (ref 0.0–0.2)

## 2022-01-04 LAB — BASIC METABOLIC PANEL
Anion gap: 13 (ref 5–15)
BUN: 25 mg/dL — ABNORMAL HIGH (ref 6–20)
CO2: 25 mmol/L (ref 22–32)
Calcium: 8.7 mg/dL — ABNORMAL LOW (ref 8.9–10.3)
Chloride: 98 mmol/L (ref 98–111)
Creatinine, Ser: 1 mg/dL (ref 0.61–1.24)
GFR, Estimated: >60 mL/min (ref >60)
Glucose, Bld: 110 mg/dL — ABNORMAL HIGH (ref 70–99)
Potassium: 4 mmol/L (ref 3.5–5.1)
Sodium: 136 mmol/L (ref 135–145)

## 2022-01-04 LAB — LACTIC ACID, PLASMA: Lactic Acid, Venous: 1.8 mmol/L (ref 0.5–1.9)

## 2022-01-04 MED ORDER — SODIUM CHLORIDE 0.9 % IV SOLN
1.0000 g | INTRAVENOUS | Status: DC
  Administered 2022-01-05: 1 g via INTRAVENOUS
  Filled 2022-01-04: qty 10

## 2022-01-04 MED ORDER — ENOXAPARIN SODIUM 40 MG/0.4ML IJ SOSY
40.0000 mg | PREFILLED_SYRINGE | INTRAMUSCULAR | Status: DC
  Administered 2022-01-05 – 2022-01-13 (×9): 40 mg via SUBCUTANEOUS
  Filled 2022-01-04 (×9): qty 0.4

## 2022-01-04 MED ORDER — VANCOMYCIN HCL IN DEXTROSE 1-5 GM/200ML-% IV SOLN
1000.0000 mg | Freq: Once | INTRAVENOUS | Status: AC
  Administered 2022-01-04: 1000 mg via INTRAVENOUS
  Filled 2022-01-04: qty 200

## 2022-01-04 MED ORDER — SODIUM CHLORIDE 0.9 % IV SOLN
2.0000 g | Freq: Once | INTRAVENOUS | Status: AC
  Administered 2022-01-04: 2 g via INTRAVENOUS
  Filled 2022-01-04: qty 12.5

## 2022-01-04 MED ORDER — DOCUSATE SODIUM 100 MG PO CAPS
100.0000 mg | ORAL_CAPSULE | Freq: Two times a day (BID) | ORAL | Status: DC
  Administered 2022-01-05 (×2): 100 mg via ORAL
  Filled 2022-01-04 (×2): qty 1

## 2022-01-04 MED ORDER — BISACODYL 5 MG PO TBEC: 5.0000 mg | DELAYED_RELEASE_TABLET | Freq: Every day | ORAL | Status: DC | PRN

## 2022-01-04 MED ORDER — SENNA 8.6 MG PO TABS
1.0000 | ORAL_TABLET | Freq: Two times a day (BID) | ORAL | Status: DC
  Administered 2022-01-05 (×2): 8.6 mg via ORAL
  Filled 2022-01-04 (×2): qty 1

## 2022-01-04 NOTE — Assessment & Plan Note (Signed)
Decubitus ulcer of L buttock with infection, may or may not have osteomyelitis based on CT scan. Wound care consult Air mattress Got cefepime + vanc in ED Will put on rocephin for the moment MRSA PCR nares Wound culture Likely needs surgical wound care consult tomorrow in AM (gen surg vs plastic surg). Ordering MRI of pelvis to look for osteomyelitis

## 2022-01-04 NOTE — H&P (Incomplete)
History and Physical    Patient: [name] KWI:[id number] DOB: 1960/07/27 DOA: 01/04/2022 DOS: the patient was seen and examined on 01/04/2022 PCP: Aliene Beams, MD  Patient coming from: Home  Chief Complaint:  Chief Complaint  Patient presents with  . Urinary Retention   HPI: [name] is a 61 y.o. 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  1 functioning kidney.  Pt in to ED today with: 1) urinary retention and 2) left buttock decubitus ulcer.  Patient's caregiver states that he normally has around 460 mL of output during the day and today only had approximately 250 mL.  Caregiver states that the patient indicates that his stomach hurts.  Caregiver is also concerned about a pressure ulcer on the left buttock confirmed to be approximately 34 month old.  Caregiver states that the pressure ulcer is draining and is large.    Review of Systems: As mentioned in the history of present illness. All other systems reviewed and are negative. Past Medical History:  Diagnosis Date  . Cerebral palsy (HCC)   . Complication of anesthesia    Nausated for 1 1/2 days after the procedure  . 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 PONV (postoperative nausea and vomiting)    Past Surgical History:  Procedure Laterality Date  . COLONOSCOPY WITH PROPOFOL N/A 08/26/2020   Procedure: COLONOSCOPY WITH PROPOFOL;  Surgeon: Vida Rigger, MD;  Location: [facility];  Service: Endoscopy;  Laterality: N/A;  . ESOPHAGOGASTRODUODENOSCOPY (EGD) WITH PROPOFOL N/A 08/26/2020   Procedure: ESOPHAGOGASTRODUODENOSCOPY (EGD) WITH PROPOFOL;  Surgeon: Vida Rigger, MD;  Location: [facility];  Service: Endoscopy;  Laterality: N/A;  . IR NEPHROSTOMY PLACEMENT RIGHT  01/03/2019  . ROBOT ASSISTED LAPAROSCOPIC NEPHRECTOMY Right 02/24/2019   Procedure: XI ROBOTIC ASSISTED LAPAROSCOPIC NEPHRECTOMY;  Surgeon:  Malen Gauze, MD;  Location: [facility];  Service: Urology;  Laterality: Right;  3 HRS  . TENDON RELEASE     Social History:  reports that he has never smoked. He has never used smokeless tobacco. He reports that he does not drink alcohol and does not use drugs.  No Known Allergies  Family History  Problem Relation Age of Onset  . Diabetes Mother   . Heart failure Mother   . Hypertension Mother   . Diabetes Father   . Heart failure Father   . Hypertension Father   . Cancer Father     Prior to Admission medications   Medication Sig Start Date End Date Taking? Authorizing Provider  acetaminophen (TYLENOL) 325 MG tablet Take 325 mg by mouth every 6 (six) hours as needed for moderate pain or headache.   Yes [provider]  silodosin (RAPAFLO) 8 MG CAPS capsule Take 1 capsule (8 mg total) by mouth daily with breakfast. Patient not taking: Reported on 01/04/2022 10/23/21   Summerlin, Regan Rakers, PA-C    Physical Exam: Vitals:   01/04/22 1744  BP: 105/79  Pulse: 78  Resp: 18  Temp: 97.7 F (36.5 C)  TempSrc: Oral  SpO2: 92%   Constitutional: NAD, calm, comfortable Eyes: PERRL, lids and conjunctivae normal ENMT: Mucous membranes are moist. Posterior pharynx clear of any exudate or lesions.Normal dentition.  Neck: normal, supple, no masses, no thyromegaly Respiratory: clear to auscultation bilaterally, no wheezing, no crackles. Normal respiratory effort. No accessory muscle use.  Cardiovascular: Regular rate and rhythm, no murmurs / rubs /  gallops. No extremity edema. 2+ pedal pulses. No carotid bruits.  Abdomen: no tenderness, no masses palpated. No hepatosplenomegaly. Bowel sounds positive.  Musculoskeletal: no clubbing / cyanosis. No joint deformity upper and lower extremities. Good ROM, no contractures. Normal muscle tone.  Skin:  7.5 cm x 5 cm x 2.5 cm pressure ulcer left buttock.  5 cm x 5 cm x 1.5 cm pressure ulcer, sacrum.    Neurologic: CN 2-12 grossly  intact. Sensation intact, DTR normal. Strength 5/5 in all 4.  Psychiatric: Normal judgment and insight. Alert and oriented x 3. Normal mood.   Data Reviewed: {Tip this will not be part of the note when signed- Document your independent interpretation of telemetry tracing, EKG, lab, Radiology test or any other diagnostic tests. Add any new diagnostic test ordered today. (Optional):[id number]}   CBC    Component Value Date/Time   WBC 16.2 (H) 01/04/2022 2005   RBC 4.51 01/04/2022 2005   HGB 12.3 (L) 01/04/2022 2005   HCT 40.0 01/04/2022 2005   PLT 502 (H) 01/04/2022 2005   MCV 88.7 01/04/2022 2005   MCH 27.3 01/04/2022 2005   MCHC 30.8 01/04/2022 2005   RDW 12.7 01/04/2022 2005   LYMPHSABS 1.3 01/04/2022 2005   MONOABS 0.9 01/04/2022 2005   EOSABS 0.1 01/04/2022 2005   BASOSABS 0.1 01/04/2022 2005   CMP     Component Value Date/Time   NA 136 01/04/2022 2005   K 4.0 01/04/2022 2005   CL 98 01/04/2022 2005   CO2 25 01/04/2022 2005   GLUCOSE 110 (H) 01/04/2022 2005   BUN 25 (H) 01/04/2022 2005   CREATININE 1.00 01/04/2022 2005   CALCIUM 8.7 (L) 01/04/2022 2005   PROT 6.6 08/22/2020 1804   ALBUMIN 3.3 (L) 08/22/2020 1804   AST 19 08/22/2020 1804   ALT 23 08/22/2020 1804   ALKPHOS 86 08/22/2020 1804   BILITOT 0.3 08/22/2020 1804   GFRNONAA >60 01/04/2022 2005   GFRAA >60 02/25/2019 2136   Urinalysis    Component Value Date/Time   COLORURINE AMBER (A) 01/04/2022 1955   APPEARANCEUR CLOUDY (A) 01/04/2022 1955   APPEARANCEUR Cloudy (A) 12/18/2021 1504   LABSPEC 1.013 01/04/2022 1955   PHURINE 9.0 (H) 01/04/2022 1955   GLUCOSEU NEGATIVE 01/04/2022 1955   HGBUR NEGATIVE 01/04/2022 1955   BILIRUBINUR NEGATIVE 01/04/2022 1955   BILIRUBINUR Negative 12/18/2021 1504   KETONESUR 5 (A) 01/04/2022 1955   PROTEINUR >=300 (A) 01/04/2022 1955   UROBILINOGEN 0.2 11/24/2014 1630   NITRITE POSITIVE (A) 01/04/2022 1955   LEUKOCYTESUR LARGE (A) 01/04/2022 1955    CT AP: IMPRESSION: 1.  Foley catheter balloon within a decompressed bladder lumen. Marked circumferential bladder wall thickening and perivesicular inflammatory stranding suggesting a superimposed infectious or inflammatory cystitis. Correlation with urinalysis and urine culture may be helpful. 2. Large volume stool within the rectosigmoid colon and rectal vault suggesting changes of fecal impaction. 3. Left gluteal decubitus wound not clearly identified on this examination, possibly excluded from view. If indicated, repeat imaging may be performed at no charge to better assess the gluteal soft tissues.  CT pelvis: IMPRESSION: 1. Large deep skin wound about the left ischial tuberosity with necrotic tissue in the ulcer base. The ulcer crater measures approximately 4.0 x 3.4 cm. There are small pockets of gas without evidence of osseous erosion. Osteomyelitis of the ischial tuberosity can not be excluded. 2. Large amount of stool in the rectum suggesting fecal impaction/constipation. 3. Thick walled urinary bladder with Foley's catheter in place.  Cystitis/urinary tract infection can not be excluded. Correlate with urinalysis.  Assessment and Plan: * Wound infection Decubitus ulcer of L buttock with infection, may or may not have osteomyelitis based on CT scan. Wound care consult Air mattress Got cefepime + vanc in ED Will put on rocephin for the moment MRSA PCR nares Wound culture Likely needs surgical wound care consult tomorrow in AM (gen surg vs plastic surg).  UTI (urinary tract infection) Rocephin as above UCx pending Foley in place for acute urinary retention.  Cerebral palsy (HCC) Non-verbal at baseline.      Advance Care Planning:   Code Status: DNR  Consults: ***  Family Communication: ***  Severity of Illness: {Observation/Inpatient:21159}  Author: Hillary Bow., DO 01/04/2022 11:58 PM  For on call review [email or web address].

## 2022-01-04 NOTE — ED Provider Notes (Signed)
[facility] DEPT Provider Note   CSN: [id number] Arrival date & time: 01/04/22  1733     History  Chief Complaint  Patient presents with   Urinary Retention    [name] is a 61 y.o. male.  Patient with history of cerebral palsy, paralysis, nonfunctioning kidney presents to the emergency department due to urinary retention.  Patient's caregiver states that he normally has around 460 mL of output during the day and today only had approximately 250 mL.  Caregiver states that the patient indicates that his stomach hurts.  Caregiver is also concerned about a pressure ulcer on the left buttock confirmed to be approximately 57 month old.  Caregiver states that the pressure ulcer is draining and is large.  HPI     Home Medications Prior to Admission medications   Medication Sig Start Date End Date Taking? Authorizing Provider  acetaminophen (TYLENOL) 325 MG tablet Take 325 mg by mouth every 6 (six) hours as needed for moderate pain or headache.    [provider]  cetirizine (ZYRTEC) 10 MG tablet Take 10 mg by mouth daily.    [provider]  Ferrous Sulfate (IRON) 325 (65 Fe) MG TABS Take by mouth.    [provider]  liver oil-zinc oxide (DESITIN) 40 % ointment Apply 1 application topically daily as needed for irritation.    [provider]  polyethylene glycol (MIRALAX) 17 g packet 1 packet mixed with 8 ounces of fluid    [provider]  silodosin (RAPAFLO) 8 MG CAPS capsule Take 1 capsule (8 mg total) by mouth daily with breakfast. 10/23/21   Summerlin, Berneice Heinrich, PA-C  Vitamin D, Ergocalciferol, (DRISDOL) 1.25 MG (50000 UNIT) CAPS capsule Take 50,000 Units by mouth once a week. 08/23/20   [provider]  Zinc Oxide (DESITIN) 40 % PSTE See admin instructions.    [provider]      Allergies    Patient has no known allergies.    Review of Systems   Review of Systems  Reason  unable to perform ROS: Patient nonverbal.    Physical Exam Updated Vital Signs BP 105/79 (BP Location: Left Arm)   Pulse 78   Temp 97.7 F (36.5 C) (Oral)   Resp 18   SpO2 92%  Physical Exam Vitals and nursing note reviewed.  Constitutional:      General: He is not in acute distress. HENT:     Nose: Nose normal.     Mouth/Throat:     Mouth: Mucous membranes are moist.  Eyes:     Conjunctiva/sclera: Conjunctivae normal.  Cardiovascular:     Rate and Rhythm: Normal rate and regular rhythm.     Pulses: Normal pulses.  Pulmonary:     Effort: Pulmonary effort is normal.     Breath sounds: Normal breath sounds.  Abdominal:     Palpations: Abdomen is soft.     Tenderness: There is abdominal tenderness (Generalized).  Musculoskeletal:        General: No deformity.     Cervical back: Normal range of motion and neck supple.  Skin:    Comments: 7.5 cm x 5 cm x 2.5 cm pressure ulcer left buttock.  5 cm x 5 cm x 1.5 cm pressure ulcer, sacrum.   Neurological:     Mental Status: He is alert.     ED Results / Procedures / Treatments   Labs (all labs ordered are listed, but only abnormal results are displayed)  Labs Reviewed  BASIC METABOLIC PANEL - Abnormal; Notable for the following components:      Result Value   Glucose, Bld 110 (*)    BUN 25 (*)    Calcium 8.7 (*)    All other components within normal limits  CBC WITH DIFFERENTIAL/PLATELET - Abnormal; Notable for the following components:   WBC 16.2 (*)    Hemoglobin 12.3 (*)    Platelets 502 (*)    Neutro Abs 13.7 (*)    Abs Immature Granulocytes 0.08 (*)    All other components within normal limits  URINALYSIS, ROUTINE W REFLEX MICROSCOPIC - Abnormal; Notable for the following components:   Color, Urine AMBER (*)    APPearance CLOUDY (*)    pH 9.0 (*)    Ketones, ur 5 (*)    Protein, ur >=300 (*)    Nitrite POSITIVE (*)    Leukocytes,Ua LARGE (*)    WBC, UA >50 (*)    Bacteria, UA RARE (*)    All other  components within normal limits  LACTIC ACID, PLASMA    EKG None  Radiology CT ABDOMEN PELVIS WO CONTRAST  Result Date: 01/04/2022 CLINICAL DATA:  Acute nonlocalized abdominal pain. Left buttock pressure ulcer. Foley catheter malfunction EXAM: CT ABDOMEN AND PELVIS WITHOUT CONTRAST TECHNIQUE: Multidetector CT imaging of the abdomen and pelvis was performed following the standard protocol without IV contrast. RADIATION DOSE REDUCTION: This exam was performed according to the departmental dose-optimization program which includes automated exposure control, adjustment of the mA and/or kV according to patient size and/or use of iterative reconstruction technique. COMPARISON:  10/07/2021 FINDINGS: Lower chest: Elevation of the right hemidiaphragm noted. Visualized lung bases are clear. Visualized heart and pericardium are unremarkable. Hepatobiliary: No focal liver abnormality is seen. No gallstones, gallbladder wall thickening, or biliary dilatation. Pancreas: Unremarkable Spleen: Unremarkable Adrenals/Urinary Tract: The adrenal glands are unremarkable. Status post right nephrectomy. The Jew[place] left kidney is unremarkable on this noncontrast examination. Foley catheter balloon seen within a decompressed bladder lumen. There is marked circumferential bladder wall thickening and perivesicular inflammatory stranding suggesting a superimposed infectious or inflammatory cystitis. No perivesicular fluid collections are identified. Stomach/Bowel: There is large volume stool within the a rectosigmoid colon and rectal vault suggesting changes of fecal impaction. Stomach, small bowel, and large bowel are otherwise unremarkable. The appendix is not clearly identified. No free intraperitoneal gas or fluid. Vascular/Lymphatic: No significant vascular findings are present. No enlarged abdominal or pelvic lymph nodes. Reproductive: Prostate gland is unremarkable. Other: Tiny fat containing umbilical hernia. Musculoskeletal:  The pelvis and gluteal soft tissues are incompletely included on this examination. Reported left gluteal ulcer is not clearly identified on this exam. Visualized osseous structures are unremarkable. No acute bone abnormality. IMPRESSION: 1. Foley catheter balloon within a decompressed bladder lumen. Marked circumferential bladder wall thickening and perivesicular inflammatory stranding suggesting a superimposed infectious or inflammatory cystitis. Correlation with urinalysis and urine culture may be helpful. 2. Large volume stool within the rectosigmoid colon and rectal vault suggesting changes of fecal impaction. 3. Left gluteal decubitus wound not clearly identified on this examination, possibly excluded from view. If indicated, repeat imaging may be performed at no charge to better assess the gluteal soft tissues. Electronically Signed   By: Fidela Salisbury M.D.   On: 01/04/2022 21:35    Procedures Procedures    Medications Ordered in ED Medications  vancomycin (VANCOCIN) IVPB 1000 mg/200 mL premix (1,000 mg Intravenous New Bag/Given 01/04/22 2206)  ceFEPIme (MAXIPIME) 2 g in sodium  chloride 0.9 % 100 mL IVPB (0 g Intravenous Stopped 01/04/22 2203)    ED Course/ Medical Decision Making/ A&P                           Medical Decision Making Amount and/or Complexity of Data Reviewed Labs: ordered. Radiology: ordered.   This patient presents to the ED for concern of urinary retention, this involves an extensive number of treatment options, and is a complaint that carries with it a high risk of complications and morbidity.  The differential diagnosis includes blood clots, malignancy, failed catheter, decreased urine production, and others  Patient also has concern over pressure ulcer.  Differential includes pressure ulcer, cellulitis, osteomyelitis   Co morbidities that complicate the patient evaluation  Cerebral palsy   Additional history obtained:  Additional history obtained from  caregivers at bedside External records from outside source obtained and reviewed including urology notes with Dr. Ronne Binning from October 25 showing a visit for a catheter change due to urinary retention.   Lab Tests:  I Ordered, and personally interpreted labs.  The pertinent results include: WBC 16.2, UA with positive nitrite, large leukocytes, greater than 50 WBC, rare bacteria;   Imaging Studies ordered:  I ordered imaging studies including CT abdomen pelvis without contrast due to patient having 1 kidney I independently visualized and interpreted imaging which showed  1. Foley catheter balloon within a decompressed bladder lumen.  Marked circumferential bladder wall thickening and perivesicular  inflammatory stranding suggesting a superimposed infectious or  inflammatory cystitis. Correlation with urinalysis and urine culture  may be helpful.  2. Large volume stool within the rectosigmoid colon and rectal vault  suggesting changes of fecal impaction.  3. Left gluteal decubitus wound not clearly identified on this  examination, possibly excluded from view. If indicated, repeat  imaging may be performed at no charge to better assess the gluteal  soft tissues.   I agree with the radiologist interpretation    Consultations Obtained:  I requested consultation with the hospitalist,  and discussed lab and imaging findings as well as pertinent plan - they recommend: repeat CT, likely admission   Problem List / ED Course / Critical interventions / Medication management   I ordered medication including cefepime and vancomycin for wound infection  Reevaluation of the patient after these medicines showed that the patient stayed the same I have reviewed the patients home medicines and have made adjustments as needed   Test / Admission - Considered:  Patient has large pressure wounds and a urinary tract infection. Patient would benefit from admission for IV antibiotics and further  evaluation as warranted.  Patient care transferred to [facility], PA-C at shift handoff. Dr.Gardner agrees to plan on admission pending repeat CT to better evaluate buttock wound.         Final Clinical Impression(s) / ED Diagnoses Final diagnoses:  Urinary retention  Urinary tract infection associated with catheterization of urinary tract, unspecified indwelling urinary catheter type, initial encounter (HCC)  Pressure injury of skin of left buttock, unspecified injury stage  Pressure injury of skin of sacral region, unspecified injury stage    Rx / DC Orders ED Discharge Orders     None         Pamala Duffel 01/04/22 2216    Rolan Bucco, MD 01/04/22 2217

## 2022-01-04 NOTE — Assessment & Plan Note (Signed)
Rocephin as above UCx pending Foley in place for acute urinary retention.

## 2022-01-04 NOTE — Assessment & Plan Note (Signed)
Non verbal at baseline

## 2022-01-04 NOTE — H&P (Addendum)
History and Physical    Patient: [name] [id number] DOB: [birth year] DOA: 01/04/2022 DOS: the patient was seen and examined on 01/04/2022 PCP: Caren Macadam, MD  Patient coming from: Home  Chief Complaint:  Chief Complaint  Patient presents with   Urinary Retention   HPI: [name] is a 61 y.o. 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  1 functioning kidney.  Pt in to ED today with: 1) urinary retention and 2) left buttock decubitus ulcer.  Patient's caregiver states that he normally has around 460 mL of output during the day and today only had approximately 250 mL.  Caregiver states that the patient indicates that his stomach hurts.  Caregiver is also concerned about a pressure ulcer on the left buttock confirmed to be approximately 45 month old.  Caregiver states that the pressure ulcer is draining and is large.   Eats regular diet per family members.   Review of Systems: As mentioned in the history of present illness. All other systems reviewed and are negative. 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   PONV (postoperative nausea and vomiting)    Past Surgical History:  Procedure Laterality Date   COLONOSCOPY WITH PROPOFOL N/A 08/26/2020   Procedure: COLONOSCOPY WITH PROPOFOL;  Surgeon: Clarene Essex, MD;  Location: [facility];  Service: Endoscopy;  Laterality: N/A;   ESOPHAGOGASTRODUODENOSCOPY (EGD) WITH PROPOFOL N/A 08/26/2020   Procedure: ESOPHAGOGASTRODUODENOSCOPY (EGD) WITH PROPOFOL;  Surgeon: Clarene Essex, MD;  Location: [facility];  Service: Endoscopy;  Laterality: N/A;   IR NEPHROSTOMY PLACEMENT RIGHT  01/03/2019   ROBOT ASSISTED LAPAROSCOPIC NEPHRECTOMY Right 02/24/2019   Procedure: XI ROBOTIC ASSISTED  LAPAROSCOPIC NEPHRECTOMY;  Surgeon: Cleon Gustin, MD;  Location: [facility];  Service: Urology;  Laterality: Right;  3 HRS   TENDON RELEASE     Social History:  reports that he has never smoked. He has never used smokeless tobacco. He reports that he does not drink alcohol and does not use drugs.  No Known Allergies  Family History  Problem Relation Age of Onset   Diabetes Mother    Heart failure Mother    Hypertension Mother    Diabetes Father    Heart failure Father    Hypertension Father    Cancer Father     Prior to Admission medications   Medication Sig Start Date End Date Taking? Authorizing Provider  acetaminophen (TYLENOL) 325 MG tablet Take 325 mg by mouth every 6 (six) hours as needed for moderate pain or headache.   Yes [provider]  silodosin (RAPAFLO) 8 MG CAPS capsule Take 1 capsule (8 mg total) by mouth daily with breakfast. Patient not taking: Reported on 01/04/2022 10/23/21   [place], Berneice Heinrich, PA-C    Physical Exam: Vitals:   01/04/22 1744  BP: 105/79  Pulse: 78  Resp: 18  Temp: 97.7 F (36.5 C)  TempSrc: Oral  SpO2: 92%   Constitutional: NAD, calm, comfortable Eyes: PERRL, lids and conjunctivae normal ENMT: Mucous membranes are moist. Posterior pharynx clear of any exudate or lesions.Normal dentition.  Neck: normal, supple, no masses, no thyromegaly Respiratory: clear to auscultation bilaterally, no wheezing, no crackles. Normal respiratory effort. No accessory muscle use.  Cardiovascular: Regular rate  and rhythm, no murmurs / rubs / gallops. No extremity edema. 2+ pedal pulses. No carotid bruits.  Abdomen: no tenderness, no masses palpated. No hepatosplenomegaly. Bowel sounds positive.  Musculoskeletal: no clubbing / cyanosis. No joint deformity upper and lower extremities. Good ROM, no contractures. Normal muscle tone.  Skin:  7.5 cm x 5 cm x 2.5 cm pressure ulcer left buttock.  5 cm x 5 cm x 1.5 cm pressure ulcer, sacrum.     Neurologic: CN 2-12 grossly intact. Sensation intact, DTR normal. Strength 5/5 in all 4.  Psychiatric: Normal judgment and insight. Alert and oriented x 3. Normal mood.   Data Reviewed:    CBC    Component Value Date/Time   WBC 16.2 (H) 01/04/2022 2005   RBC 4.51 01/04/2022 2005   HGB 12.3 (L) 01/04/2022 2005   HCT 40.0 01/04/2022 2005   PLT 502 (H) 01/04/2022 2005   MCV 88.7 01/04/2022 2005   MCH 27.3 01/04/2022 2005   MCHC 30.8 01/04/2022 2005   RDW 12.7 01/04/2022 2005   LYMPHSABS 1.3 01/04/2022 2005   MONOABS 0.9 01/04/2022 2005   EOSABS 0.1 01/04/2022 2005   BASOSABS 0.1 01/04/2022 2005   CMP     Component Value Date/Time   NA 136 01/04/2022 2005   K 4.0 01/04/2022 2005   CL 98 01/04/2022 2005   CO2 25 01/04/2022 2005   GLUCOSE 110 (H) 01/04/2022 2005   BUN 25 (H) 01/04/2022 2005   CREATININE 1.00 01/04/2022 2005   CALCIUM 8.7 (L) 01/04/2022 2005   PROT 6.6 08/22/2020 1804   ALBUMIN 3.3 (L) 08/22/2020 1804   AST 19 08/22/2020 1804   ALT 23 08/22/2020 1804   ALKPHOS 86 08/22/2020 1804   BILITOT 0.3 08/22/2020 1804   GFRNONAA >60 01/04/2022 2005   GFRAA >60 02/25/2019 2136   Urinalysis    Component Value Date/Time   COLORURINE AMBER (A) 01/04/2022 1955   APPEARANCEUR CLOUDY (A) 01/04/2022 1955   APPEARANCEUR Cloudy (A) 12/18/2021 1504   LABSPEC 1.013 01/04/2022 1955   PHURINE 9.0 (H) 01/04/2022 1955   GLUCOSEU NEGATIVE 01/04/2022 1955   HGBUR NEGATIVE 01/04/2022 1955   BILIRUBINUR NEGATIVE 01/04/2022 1955   BILIRUBINUR Negative 12/18/2021 1504   KETONESUR 5 (A) 01/04/2022 1955   PROTEINUR >=300 (A) 01/04/2022 1955   UROBILINOGEN 0.2 11/24/2014 1630   NITRITE POSITIVE (A) 01/04/2022 1955   LEUKOCYTESUR LARGE (A) 01/04/2022 1955    CT AP: IMPRESSION: 1. Foley catheter balloon within a decompressed bladder lumen. Marked circumferential bladder wall thickening and perivesicular inflammatory stranding suggesting a superimposed infectious  or inflammatory cystitis. Correlation with urinalysis and urine culture may be helpful. 2. Large volume stool within the rectosigmoid colon and rectal vault suggesting changes of fecal impaction. 3. Left gluteal decubitus wound not clearly identified on this examination, possibly excluded from view. If indicated, repeat imaging may be performed at no charge to better assess the gluteal soft tissues.  CT pelvis: IMPRESSION: 1. Large deep skin wound about the left ischial tuberosity with necrotic tissue in the ulcer base. The ulcer crater measures approximately 4.0 x 3.4 cm. There are small pockets of gas without evidence of osseous erosion. Osteomyelitis of the ischial tuberosity can not be excluded. 2. Large amount of stool in the rectum suggesting fecal impaction/constipation. 3. Thick walled urinary bladder with Foley's catheter in place. Cystitis/urinary tract infection can not be excluded. Correlate with urinalysis.  Assessment and Plan: * Wound infection Decubitus ulcer of L buttock with infection, may or may  not have osteomyelitis based on CT scan. Wound care consult Air mattress Got cefepime + vanc in ED Will put on rocephin for the moment MRSA PCR nares Wound culture Likely needs surgical wound care consult tomorrow in AM (gen surg vs plastic surg). Ordering MRI of pelvis to look for osteomyelitis  UTI (urinary tract infection) Rocephin as above UCx pending Foley in place for acute urinary retention.  Cerebral palsy ([place]) Non-verbal at baseline.      Advance Care Planning:   Code Status: DNR  Consults: None  Family Communication: Family at bedside  Severity of Illness: The appropriate patient status for this patient is OBSERVATION. Observation status is judged to be reasonable and necessary in order to provide the required intensity of service to ensure the patient's safety. The patient's presenting symptoms, physical exam findings, and initial radiographic  and laboratory data in the context of their medical condition is felt to place them at decreased risk for further clinical deterioration. Furthermore, it is anticipated that the patient will be medically stable for discharge from the hospital within 2 midnights of admission.   Author: Etta Quill., DO 01/04/2022 11:58 PM  For on call review [email or web address].

## 2022-01-04 NOTE — ED Triage Notes (Addendum)
Patient has a foley catheter in place.[name] it drained today only. Caregiver said during the day he usually has around 400-66mL. Caregiver said he has said his belly hurts. Stated there is sediment in the urine. Caregiver changed foley bag before arrival and the catheter was changed 2 weeks ago. Has had a foley for 4 months.

## 2022-01-04 NOTE — ED Provider Triage Note (Signed)
Emergency Medicine Provider Triage Evaluation Note  [name] , a 61 y.o. male  was evaluated in triage.  Pt complains of not making urine for the last day. Per mother, patient has chronic indwelling catheter and has not made urine in past day. Appropriate eating and drinking. Mother is concerned he pulled catheter partially out. Also concerned for buttocks wound. .  Review of Systems  Positive: Wound on buttocks Negative: fever  Physical Exam  There were no vitals taken for this visit. Gen:   Awake, no distress   Resp:  Normal effort   Medical Decision Making  Medically screening exam initiated at 5:44 PM.  Appropriate orders placed.  Geanie Kenning was informed that the remainder of the evaluation will be completed by another provider, this initial triage assessment does not replace that evaluation, and the importance of remaining in the ED until their evaluation is complete.    [name], [place] 01/04/22 1750

## 2022-01-04 NOTE — Progress Notes (Signed)
A consult was received from an ED physician for vanc/cefepime per pharmacy dosing.  The patient's profile has been reviewed for ht/wt/allergies/indication/available labs.   A one time order has been placed for vanc 1g and cefepime 2g.  Further antibiotics/pharmacy consults should be ordered by admitting physician if indicated.                       Thank you, Berkley Harvey 01/04/2022  8:57 PM

## 2022-01-05 ENCOUNTER — Observation Stay (HOSPITAL_COMMUNITY): Payer: Medicare Other

## 2022-01-05 DIAGNOSIS — M86152 Other acute osteomyelitis, left femur: Secondary | ICD-10-CM | POA: Diagnosis not present

## 2022-01-05 DIAGNOSIS — R600 Localized edema: Secondary | ICD-10-CM | POA: Diagnosis not present

## 2022-01-05 DIAGNOSIS — T148XXA Other injury of unspecified body region, initial encounter: Secondary | ICD-10-CM | POA: Diagnosis not present

## 2022-01-05 DIAGNOSIS — L089 Local infection of the skin and subcutaneous tissue, unspecified: Secondary | ICD-10-CM | POA: Diagnosis not present

## 2022-01-05 DIAGNOSIS — L89154 Pressure ulcer of sacral region, stage 4: Secondary | ICD-10-CM | POA: Diagnosis not present

## 2022-01-05 LAB — CBC WITH DIFFERENTIAL/PLATELET
Abs Immature Granulocytes: 0.02 10*3/uL (ref 0.00–0.07)
Basophils Absolute: 0.1 10*3/uL (ref 0.0–0.1)
Basophils Relative: 1 %
Eosinophils Absolute: 0.2 10*3/uL (ref 0.0–0.5)
Eosinophils Relative: 2 %
HCT: 34.7 % — ABNORMAL LOW (ref 39.0–52.0)
Hemoglobin: 10.6 g/dL — ABNORMAL LOW (ref 13.0–17.0)
Immature Granulocytes: 0 %
Lymphocytes Relative: 11 %
Lymphs Abs: 1 10*3/uL (ref 0.7–4.0)
MCH: 27.1 pg (ref 26.0–34.0)
MCHC: 30.5 g/dL (ref 30.0–36.0)
MCV: 88.7 fL (ref 80.0–100.0)
Monocytes Absolute: 0.6 10*3/uL (ref 0.1–1.0)
Monocytes Relative: 6 %
Neutro Abs: 7.2 10*3/uL (ref 1.7–7.7)
Neutrophils Relative %: 80 %
Platelets: 433 10*3/uL — ABNORMAL HIGH (ref 150–400)
RBC: 3.91 MIL/uL — ABNORMAL LOW (ref 4.22–5.81)
RDW: 12.8 % (ref 11.5–15.5)
WBC: 9 10*3/uL (ref 4.0–10.5)
nRBC: 0 % (ref 0.0–0.2)

## 2022-01-05 LAB — RENAL FUNCTION PANEL
Albumin: 2.5 g/dL — ABNORMAL LOW (ref 3.5–5.0)
Anion gap: 8 (ref 5–15)
BUN: 23 mg/dL — ABNORMAL HIGH (ref 6–20)
CO2: 26 mmol/L (ref 22–32)
Calcium: 8.2 mg/dL — ABNORMAL LOW (ref 8.9–10.3)
Chloride: 102 mmol/L (ref 98–111)
Creatinine, Ser: 0.85 mg/dL (ref 0.61–1.24)
GFR, Estimated: >60 mL/min (ref >60)
Glucose, Bld: 91 mg/dL (ref 70–99)
Phosphorus: 2.7 mg/dL (ref 2.5–4.6)
Potassium: 3.8 mmol/L (ref 3.5–5.1)
Sodium: 136 mmol/L (ref 135–145)

## 2022-01-05 LAB — CBC
HCT: 36.8 % — ABNORMAL LOW (ref 39.0–52.0)
Hemoglobin: 11.3 g/dL — ABNORMAL LOW (ref 13.0–17.0)
MCH: 27.5 pg (ref 26.0–34.0)
MCHC: 30.7 g/dL (ref 30.0–36.0)
MCV: 89.5 fL (ref 80.0–100.0)
Platelets: 461 10*3/uL — ABNORMAL HIGH (ref 150–400)
RBC: 4.11 MIL/uL — ABNORMAL LOW (ref 4.22–5.81)
RDW: 12.8 % (ref 11.5–15.5)
WBC: 10.7 10*3/uL — ABNORMAL HIGH (ref 4.0–10.5)
nRBC: 0 % (ref 0.0–0.2)

## 2022-01-05 LAB — BASIC METABOLIC PANEL
Anion gap: 12 (ref 5–15)
BUN: 25 mg/dL — ABNORMAL HIGH (ref 6–20)
CO2: 23 mmol/L (ref 22–32)
Calcium: 8.4 mg/dL — ABNORMAL LOW (ref 8.9–10.3)
Chloride: 102 mmol/L (ref 98–111)
Creatinine, Ser: 0.89 mg/dL (ref 0.61–1.24)
GFR, Estimated: >60 mL/min (ref >60)
Glucose, Bld: 97 mg/dL (ref 70–99)
Potassium: 3.8 mmol/L (ref 3.5–5.1)
Sodium: 137 mmol/L (ref 135–145)

## 2022-01-05 LAB — MRSA NEXT GEN BY PCR, NASAL: MRSA by PCR Next Gen: NOT DETECTED

## 2022-01-05 LAB — GLUCOSE, CAPILLARY: Glucose-Capillary: 98 mg/dL (ref 70–99)

## 2022-01-05 MED ORDER — SODIUM CHLORIDE 0.9 % IV SOLN
2.0000 g | Freq: Three times a day (TID) | INTRAVENOUS | Status: DC
  Administered 2022-01-05 – 2022-01-07 (×6): 2 g via INTRAVENOUS
  Filled 2022-01-05 (×7): qty 12.5

## 2022-01-05 MED ORDER — VANCOMYCIN HCL 750 MG/150ML IV SOLN
750.0000 mg | Freq: Two times a day (BID) | INTRAVENOUS | Status: DC
  Administered 2022-01-05 – 2022-01-07 (×4): 750 mg via INTRAVENOUS
  Filled 2022-01-05 (×5): qty 150

## 2022-01-05 MED ORDER — SODIUM CHLORIDE 0.9 % IV BOLUS
500.0000 mL | Freq: Once | INTRAVENOUS | Status: AC
  Administered 2022-01-05: 500 mL via INTRAVENOUS

## 2022-01-05 MED ORDER — GADOBUTROL 1 MMOL/ML IV SOLN
5.5000 mL | Freq: Once | INTRAVENOUS | Status: AC | PRN
  Administered 2022-01-05: 5.5 mL via INTRAVENOUS

## 2022-01-05 NOTE — Progress Notes (Signed)
PROGRESS NOTE    [name]  QMV:[id number] DOB: 09/17/1960 DOA: 01/04/2022 PCP: Aliene Beams, MD  Outpatient Specialists:     Brief Narrative:  As per H&P done on admission: "[name] is a 61 y.o. 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  1 functioning kidney.   Pt in to ED today with: 1) urinary retention and 2) left buttock decubitus ulcer.   Patient's caregiver states that he normally has around 460 mL of output during the day and today only had approximately 250 mL.  Caregiver states that the patient indicates that his stomach hurts.  Caregiver is also concerned about a pressure ulcer on the left buttock confirmed to be approximately 22 month old.  Caregiver states that the pressure ulcer is draining and is large".   01/05/2022: Patient is nonverbal, therefore, cannot give any history.  Patient was admitted with urinary retention, likely UTI, left buttock decubitus ulcer and osteomyelitis.  MRI of the left hip has confirmed osteomyelitis.  We will send samples for cultures.  We will change antibiotics from IV Rocephin to IV Vanco and cefepime.  Consider consulting infectious disease team in the morning.  Follow cultures.         Assessment & Plan:   Principal Problem:   Wound infection Active Problems:   Pressure ulcer   UTI (urinary tract infection)   Cerebral palsy (HCC)   Urinary retention/complicated UTI: -Foley catheter has been exchanged. -Follow urine culture. -Continue IV antibiotics.  Decubitus ulcer left buttocks area: -Wound care team has been consulted. -Follow cultures. -IV antibiotics. -Low threshold to consult the surgical team.  Osteomyelitis left ischial bone: -Antibiotics as documented above. -Low threshold to consult the ID team in the morning.  MRI pelvis with and without contrast revealed: 1. Grade 4 decubitus ulcer posterior to the left ischium  with underlying left ischial osteomyelitis. There is also a grade 4 decubitus ulcer posteriorly overlying the coccyx with associated coccygeal osteomyelitis. Adjacent edema and enhancement in involve musculature including gluteal and left obturator internus musculature. 2. Thick-walled urinary bladder with Foley catheter in place. Cystitis not excluded. 3. Prominence of gas and stool in the rectum as on the CT scan suggesting fecal impaction. There is some trace perirectal edema, cannot exclude low-grade stercoral colitis although rectal wall enhancement pattern is normal. 4. Small right scrotal hydrocele. 5. Mildly asymmetric reduced T2 signal in the left posterolateral peripheral zone of the prostate gland, nonspecific.  Fecal impaction: -Continue senna and Colace. -Trial of enema.     DVT prophylaxis: Subcutaneous Lovenox Code Status: DO NOT RESUSCITATE Family Communication:  Disposition Plan: This will depend on hospital course   Consultants:  None for now.  Please have low threshold to consult infectious disease team and surgical team.  Procedures:  None  Antimicrobials:  IV Rocephin has been changed to IV cefepime. IV vancomycin.   Subjective: Patient is nonverbal.  Objective: Vitals:   01/05/22 0447 01/05/22 0542 01/05/22 0952 01/05/22 1406  BP: (!) 146/89 (!) 148/81 91/66 (!) 84/54  Pulse: 89 94 65 83  Resp: 16 16    Temp: 98.7 F (37.1 C) 98.6 F (37 C) 98.2 F (36.8 C) 98.2 F (36.8 C)  TempSrc: Oral Oral Oral Oral  SpO2: 99% 95% 93% 97%    Intake/Output Summary (Last 24 hours) at 01/05/2022 1525 Last data filed at 01/05/2022 1400 Gross per 24 hour  Intake 1183.33 ml  Output  1000 ml  Net 183.33 ml   There were no vitals filed for this visit.  Examination:  General exam: Appears calm and comfortable.  Patient is thin.  Contracture of the extremities. Respiratory system: Clear to auscultation.  Cardiovascular system: S1 & S2  heard. Gastrointestinal system: Abdomen is nondistended, soft and nontender.  Central nervous system: Awake and alert.   Extremities: Contracted.  No edema.  Data Reviewed: I have personally reviewed following labs and imaging studies  CBC: Recent Labs  Lab 01/04/22 2005 01/05/22 0457 01/05/22 1157  WBC 16.2* 10.7* 9.0  NEUTROABS 13.7*  --  7.2  HGB 12.3* 11.3* 10.6*  HCT 40.0 36.8* 34.7*  MCV 88.7 89.5 88.7  PLT 502* 461* 433*   Basic Metabolic Panel: Recent Labs  Lab 01/04/22 2005 01/05/22 0457 01/05/22 1157  NA 136 137 136  K 4.0 3.8 3.8  CL 98 102 102  CO2 25 23 26   GLUCOSE 110* 97 91  BUN 25* 25* 23*  CREATININE 1.00 0.89 0.85  CALCIUM 8.7* 8.4* 8.2*  PHOS  --   --  2.7   GFR: CrCl cannot be calculated (Unknown ideal weight.). Liver Function Tests: Recent Labs  Lab 01/05/22 1157  ALBUMIN 2.5*   No results for input(s): "LIPASE", "AMYLASE" in the last 168 hours. No results for input(s): "AMMONIA" in the last 168 hours. Coagulation Profile: No results for input(s): "INR", "PROTIME" in the last 168 hours. Cardiac Enzymes: No results for input(s): "CKTOTAL", "CKMB", "CKMBINDEX", "TROPONINI" in the last 168 hours. BNP (last 3 results) No results for input(s): "PROBNP" in the last 8760 hours. HbA1C: No results for input(s): "HGBA1C" in the last 72 hours. CBG: Recent Labs  Lab 01/05/22 0331  GLUCAP 98   Lipid Profile: No results for input(s): "CHOL", "HDL", "LDLCALC", "TRIG", "CHOLHDL", "LDLDIRECT" in the last 72 hours. Thyroid Function Tests: No results for input(s): "TSH", "T4TOTAL", "FREET4", "T3FREE", "THYROIDAB" in the last 72 hours. Anemia Panel: No results for input(s): "VITAMINB12", "FOLATE", "FERRITIN", "TIBC", "IRON", "RETICCTPCT" in the last 72 hours. Urine analysis:    Component Value Date/Time   COLORURINE AMBER (A) 01/04/2022 1955   APPEARANCEUR CLOUDY (A) 01/04/2022 1955   APPEARANCEUR Cloudy (A) 12/18/2021 1504   LABSPEC 1.013  01/04/2022 1955   PHURINE 9.0 (H) 01/04/2022 1955   GLUCOSEU NEGATIVE 01/04/2022 1955   HGBUR NEGATIVE 01/04/2022 1955   BILIRUBINUR NEGATIVE 01/04/2022 1955   BILIRUBINUR Negative 12/18/2021 1504   KETONESUR 5 (A) 01/04/2022 1955   PROTEINUR >=300 (A) 01/04/2022 1955   UROBILINOGEN 0.2 11/24/2014 1630   NITRITE POSITIVE (A) 01/04/2022 1955   LEUKOCYTESUR LARGE (A) 01/04/2022 1955   Sepsis Labs: @LABRCNTIP (procalcitonin:4,lacticidven:4)  ) Recent Results (from the past 240 hour(s))  MRSA Next Gen by PCR, Nasal     Status: None   Collection Time: 01/05/22  5:18 AM   Specimen: Nasal Mucosa; Nasal Swab  Result Value Ref Range Status   MRSA by PCR Next Gen NOT DETECTED NOT DETECTED Final    Comment: (NOTE) The GeneXpert MRSA Assay (FDA approved for NASAL specimens only), is one component of a comprehensive MRSA colonization surveillance program. It is not intended to diagnose MRSA infection nor to guide or monitor treatment for MRSA infections. Test performance is not FDA approved in patients less than 24 years old. Performed at [facility], [address] [place], [place]          Radiology Studies: MR PELVIS W WO CONTRAST  Result Date: 01/05/2022 CLINICAL  DATA:  Left ischial decubitus ulcer, query osteomyelitis EXAM: MRI PELVIS WITHOUT AND WITH CONTRAST TECHNIQUE: Multiplanar multisequence MR imaging of the pelvis was performed both before and after administration of intravenous contrast. CONTRAST:  5.49mL GADAVIST GADOBUTROL 1 MMOL/ML IV SOLN COMPARISON:  CT pelvis 01/04/2022 FINDINGS: Urinary Tract: Thick-walled urinary bladder with Foley catheter in place. Cystitis not excluded. Bowel: Prominence of gas and stool in the rectum as on the CT scan suggesting fecal impaction. There is some trace perirectal edema, cannot exclude low-grade stercoral colitis although rectal wall enhancement pattern is normal. Vascular/Lymphatic: Unremarkable  Reproductive: Mildly asymmetric reduced T2 signal in the left posterolateral peripheral zone of the prostate gland, nonspecific. Small right scrotal hydrocele. Other: Trace presacral edema. There is potentially a small amount of right eccentric ascites along the right paracolic gutter on uppermost images. Musculoskeletal: Left ischial osteomyelitis noted with abnormal enhancement and edema in the left ischium underlying a grade 4 decubitus ulcer which extends all the way to the periosteal margin as shown on image 34 series 15 no overt bony resorption. There is abnormal edema the adjacent gluteal and obturator internus muscles along the margins of the ulcer. No definite connectivity with the left hip joint. There is likewise a decubitus ulcer posteriorly overlying the coccyx on image 30 of series 15, extending all the way down to the level of the coccyx. There is associated abnormal edema and enhancement in the coccyx on images 29-30 of series 13 compatible with coccygeal osteomyelitis. Trace edema along the pubic bones inferiorly, likely due to low-grade arthropathy. IMPRESSION: 1. Grade 4 decubitus ulcer posterior to the left ischium with underlying left ischial osteomyelitis. There is also a grade 4 decubitus ulcer posteriorly overlying the coccyx with associated coccygeal osteomyelitis. Adjacent edema and enhancement in involve musculature including gluteal and left obturator internus musculature. 2. Thick-walled urinary bladder with Foley catheter in place. Cystitis not excluded. 3. Prominence of gas and stool in the rectum as on the CT scan suggesting fecal impaction. There is some trace perirectal edema, cannot exclude low-grade stercoral colitis although rectal wall enhancement pattern is normal. 4. Small right scrotal hydrocele. 5. Mildly asymmetric reduced T2 signal in the left posterolateral peripheral zone of the prostate gland, nonspecific. Electronically Signed   By: Gaylyn Rong M.D.   On:  01/05/2022 11:56   CT PELVIS LIMITED WO CONTRAST  Result Date: 01/04/2022 CLINICAL DATA:  Gluteal decubitus ulcer. EXAM: CT PELVIS WITHOUT CONTRAST TECHNIQUE: Multidetector CT imaging of the pelvis was performed following the standard protocol without intravenous contrast. RADIATION DOSE REDUCTION: This exam was performed according to the departmental dose-optimization program which includes automated exposure control, adjustment of the mA and/or kV according to patient size and/or use of iterative reconstruction technique. COMPARISON:  CT examination performed earlier on the same date. FINDINGS: Urinary Tract: Thick walled urinary bladder with Foley's catheter in place. Bowel: Large amount of stool in the rectum suggesting fecal impaction/constipation. Vascular/Lymphatic: No pathologically enlarged lymph nodes. No significant vascular abnormality seen. Reproductive:  No mass or other significant abnormality Other:  None. Musculoskeletal: Large deep skin wound about the left ischial tuberosity with necrotic tissue in the ulcer base. The ulcer crater measures approximately 4.0 x 3.4 cm. There are small pockets of gas ischial tuberosity without evidence of osseous erosion. IMPRESSION: 1. Large deep skin wound about the left ischial tuberosity with necrotic tissue in the ulcer base. The ulcer crater measures approximately 4.0 x 3.4 cm. There are small pockets of gas without evidence of osseous erosion.  Osteomyelitis of the ischial tuberosity can not be excluded. 2. Large amount of stool in the rectum suggesting fecal impaction/constipation. 3. Thick walled urinary bladder with Foley's catheter in place. Cystitis/urinary tract infection can not be excluded. Correlate with urinalysis. Electronically Signed   By: Larose HiresImran  Ahmed D.O.   On: 01/04/2022 23:28   CT ABDOMEN PELVIS WO CONTRAST  Result Date: 01/04/2022 CLINICAL DATA:  Acute nonlocalized abdominal pain. Left buttock pressure ulcer. Foley catheter  malfunction EXAM: CT ABDOMEN AND PELVIS WITHOUT CONTRAST TECHNIQUE: Multidetector CT imaging of the abdomen and pelvis was performed following the standard protocol without IV contrast. RADIATION DOSE REDUCTION: This exam was performed according to the departmental dose-optimization program which includes automated exposure control, adjustment of the mA and/or kV according to patient size and/or use of iterative reconstruction technique. COMPARISON:  10/07/2021 FINDINGS: Lower chest: Elevation of the right hemidiaphragm noted. Visualized lung bases are clear. Visualized heart and pericardium are unremarkable. Hepatobiliary: No focal liver abnormality is seen. No gallstones, gallbladder wall thickening, or biliary dilatation. Pancreas: Unremarkable Spleen: Unremarkable Adrenals/Urinary Tract: The adrenal glands are unremarkable. Status post right nephrectomy. The Jew[place] left kidney is unremarkable on this noncontrast examination. Foley catheter balloon seen within a decompressed bladder lumen. There is marked circumferential bladder wall thickening and perivesicular inflammatory stranding suggesting a superimposed infectious or inflammatory cystitis. No perivesicular fluid collections are identified. Stomach/Bowel: There is large volume stool within the a rectosigmoid colon and rectal vault suggesting changes of fecal impaction. Stomach, small bowel, and large bowel are otherwise unremarkable. The appendix is not clearly identified. No free intraperitoneal gas or fluid. Vascular/Lymphatic: No significant vascular findings are present. No enlarged abdominal or pelvic lymph nodes. Reproductive: Prostate gland is unremarkable. Other: Tiny fat containing umbilical hernia. Musculoskeletal: The pelvis and gluteal soft tissues are incompletely included on this examination. Reported left gluteal ulcer is not clearly identified on this exam. Visualized osseous structures are unremarkable. No acute bone abnormality. IMPRESSION:  1. Foley catheter balloon within a decompressed bladder lumen. Marked circumferential bladder wall thickening and perivesicular inflammatory stranding suggesting a superimposed infectious or inflammatory cystitis. Correlation with urinalysis and urine culture may be helpful. 2. Large volume stool within the rectosigmoid colon and rectal vault suggesting changes of fecal impaction. 3. Left gluteal decubitus wound not clearly identified on this examination, possibly excluded from view. If indicated, repeat imaging may be performed at no charge to better assess the gluteal soft tissues. Electronically Signed   By: Helyn NumbersAshesh  Parikh M.D.   On: 01/04/2022 21:35        Scheduled Meds:  docusate sodium  100 mg Oral BID   enoxaparin (LOVENOX) injection  40 mg Subcutaneous Q24H   senna  1 tablet Oral BID   Continuous Infusions:   LOS: 0 days    Time spent: 55 minutes.    Berton MountSylvester Ardys Hataway, MD  [facility] Pager #: [phone] 7PM-7AM contact night coverage as above

## 2022-01-05 NOTE — Progress Notes (Signed)
Pharmacy Antibiotic Note  [name] is a 61 y.o. male admitted on 01/04/2022 with sacral decubitus ulcer concerning for osteomyelitis.  Pharmacy has been consulted for vancomycin and cefepime dosing.  Plan: Cefepime 2g IV q8h Vancomycin 750mg  IV q12h for estimated AUC 527 using SCr 0.85, TBW, Vd 0.72 Check vancomycin levels at steady state, goal AUC 400-550 Follow up renal function & cultures    Temp (24hrs), Avg:98.3 F (36.8 C), Min:97.7 F (36.5 C), Max:98.7 F (37.1 C)  Recent Labs  Lab 01/04/22 2005 01/05/22 0457 01/05/22 1157  WBC 16.2* 10.7* 9.0  CREATININE 1.00 0.89 0.85  LATICACIDVEN 1.8  --   --     CrCl cannot be calculated (Unknown ideal weight.).    No Known Allergies  Antimicrobials this admission: 11/11 Vanc/Cefepime x 1, resume 11/12 >> 11/12 Ceftriaxone x 1  Dose adjustments this admission:  Microbiology results: 11/12 UCx: 11/12 MRSA PCR: neg  Thank you for allowing pharmacy to be a part of this patient's care.  13/12, PharmD, BCPS Pharmacy: [phone] 01/05/2022 3:31 PM

## 2022-01-06 ENCOUNTER — Observation Stay (HOSPITAL_COMMUNITY): Payer: Medicare Other

## 2022-01-06 DIAGNOSIS — Y738 Miscellaneous gastroenterology and urology devices associated with adverse incidents, not elsewhere classified: Secondary | ICD-10-CM | POA: Diagnosis present

## 2022-01-06 DIAGNOSIS — Y846 Urinary catheterization as the cause of abnormal reaction of the patient, or of later complication, without mention of misadventure at the time of the procedure: Secondary | ICD-10-CM | POA: Diagnosis present

## 2022-01-06 DIAGNOSIS — K5289 Other specified noninfective gastroenteritis and colitis: Secondary | ICD-10-CM | POA: Diagnosis present

## 2022-01-06 DIAGNOSIS — L89150 Pressure ulcer of sacral region, unstageable: Secondary | ICD-10-CM | POA: Diagnosis present

## 2022-01-06 DIAGNOSIS — L89320 Pressure ulcer of left buttock, unstageable: Secondary | ICD-10-CM | POA: Diagnosis present

## 2022-01-06 DIAGNOSIS — R600 Localized edema: Secondary | ICD-10-CM | POA: Diagnosis present

## 2022-01-06 DIAGNOSIS — T148XXA Other injury of unspecified body region, initial encounter: Secondary | ICD-10-CM | POA: Diagnosis not present

## 2022-01-06 DIAGNOSIS — L089 Local infection of the skin and subcutaneous tissue, unspecified: Secondary | ICD-10-CM | POA: Diagnosis not present

## 2022-01-06 DIAGNOSIS — Z905 Acquired absence of kidney: Secondary | ICD-10-CM | POA: Diagnosis not present

## 2022-01-06 DIAGNOSIS — N390 Urinary tract infection, site not specified: Secondary | ICD-10-CM | POA: Diagnosis present

## 2022-01-06 DIAGNOSIS — R7989 Other specified abnormal findings of blood chemistry: Secondary | ICD-10-CM | POA: Diagnosis present

## 2022-01-06 DIAGNOSIS — M4628 Osteomyelitis of vertebra, sacral and sacrococcygeal region: Secondary | ICD-10-CM | POA: Diagnosis present

## 2022-01-06 DIAGNOSIS — G839 Paralytic syndrome, unspecified: Secondary | ICD-10-CM | POA: Diagnosis present

## 2022-01-06 DIAGNOSIS — M869 Osteomyelitis, unspecified: Secondary | ICD-10-CM | POA: Diagnosis present

## 2022-01-06 DIAGNOSIS — K5641 Fecal impaction: Secondary | ICD-10-CM | POA: Diagnosis present

## 2022-01-06 DIAGNOSIS — B9620 Unspecified Escherichia coli [E. coli] as the cause of diseases classified elsewhere: Secondary | ICD-10-CM | POA: Diagnosis present

## 2022-01-06 DIAGNOSIS — L89324 Pressure ulcer of left buttock, stage 4: Secondary | ICD-10-CM | POA: Diagnosis present

## 2022-01-06 DIAGNOSIS — T83518A Infection and inflammatory reaction due to other urinary catheter, initial encounter: Secondary | ICD-10-CM | POA: Diagnosis present

## 2022-01-06 DIAGNOSIS — R944 Abnormal results of kidney function studies: Secondary | ICD-10-CM | POA: Diagnosis present

## 2022-01-06 DIAGNOSIS — Z993 Dependence on wheelchair: Secondary | ICD-10-CM | POA: Diagnosis not present

## 2022-01-06 DIAGNOSIS — K5900 Constipation, unspecified: Secondary | ICD-10-CM | POA: Diagnosis not present

## 2022-01-06 DIAGNOSIS — L89159 Pressure ulcer of sacral region, unspecified stage: Secondary | ICD-10-CM | POA: Diagnosis not present

## 2022-01-06 DIAGNOSIS — R339 Retention of urine, unspecified: Secondary | ICD-10-CM | POA: Diagnosis present

## 2022-01-06 DIAGNOSIS — K6389 Other specified diseases of intestine: Secondary | ICD-10-CM | POA: Diagnosis not present

## 2022-01-06 DIAGNOSIS — Z66 Do not resuscitate: Secondary | ICD-10-CM | POA: Diagnosis present

## 2022-01-06 DIAGNOSIS — B964 Proteus (mirabilis) (morganii) as the cause of diseases classified elsewhere: Secondary | ICD-10-CM | POA: Diagnosis present

## 2022-01-06 DIAGNOSIS — G809 Cerebral palsy, unspecified: Secondary | ICD-10-CM | POA: Diagnosis present

## 2022-01-06 LAB — URINE CULTURE

## 2022-01-06 MED ORDER — POLYETHYLENE GLYCOL 3350 17 G PO PACK
17.0000 g | PACK | Freq: Two times a day (BID) | ORAL | Status: DC
  Administered 2022-01-06 – 2022-01-13 (×11): 17 g via ORAL
  Filled 2022-01-06 (×12): qty 1

## 2022-01-06 MED ORDER — BISACODYL 10 MG RE SUPP
10.0000 mg | Freq: Every day | RECTAL | Status: DC
  Administered 2022-01-06 – 2022-01-13 (×6): 10 mg via RECTAL
  Filled 2022-01-06 (×7): qty 1

## 2022-01-06 MED ORDER — FLEET ENEMA 7-19 GM/118ML RE ENEM: 1.0000 | ENEMA | Freq: Every day | RECTAL | Status: DC | PRN

## 2022-01-06 MED ORDER — SODIUM CHLORIDE 0.9 % IV SOLN: INTRAVENOUS | Status: DC | PRN

## 2022-01-06 MED ORDER — MEDIHONEY WOUND/BURN DRESSING EX PSTE
1.0000 | PASTE | Freq: Every day | CUTANEOUS | Status: DC
  Administered 2022-01-06 – 2022-01-13 (×7): 1 via TOPICAL
  Filled 2022-01-06 (×2): qty 44

## 2022-01-06 MED ORDER — MAGNESIUM HYDROXIDE 400 MG/5ML PO SUSP
15.0000 mL | Freq: Every day | ORAL | Status: DC
  Administered 2022-01-06 – 2022-01-13 (×8): 15 mL via ORAL
  Filled 2022-01-06 (×8): qty 30

## 2022-01-06 MED ORDER — SENNOSIDES-DOCUSATE SODIUM 8.6-50 MG PO TABS
1.0000 | ORAL_TABLET | Freq: Two times a day (BID) | ORAL | Status: DC
  Administered 2022-01-06 – 2022-01-10 (×7): 1 via ORAL
  Filled 2022-01-06 (×7): qty 1

## 2022-01-06 NOTE — Hospital Course (Signed)
61 year old male with past medical history of cerebral palsy nonverbal at baseline, wheelchair-bound, right hemiparesis with left-sided contracture presented to the hospital with complaints of decreased urination.  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  Along with fecal impaction. There was some concerns for UTI and a Foley catheter was changed in the ED. General surgery consulted.  ID will be consulted as well.  Currently on IV antibiotics.

## 2022-01-06 NOTE — Consult Note (Addendum)
WOC Nurse Consult Note: Reason for Consult: Consult requested for sacrum and left ischium.  Mother is the primary caregiver and is at the bedside to answer questions.  Pt is immobile and contracted and frequently incontinent of urine and stool. It is difficult to keep the wounds from becoming soiled. Related to the locations in close proximity to incontinence.  Mother went away to a funeral for several days while a caregiver was present and when she returned the wounds had significantly declined.  They had large amt foul odor and brown drainage and had evolved in size and depth.  She has been using Medihoney prior to admission.   Wound type: Sacrum with Unstageable pressure injury; 4X1cm, 90% slough/eschar, 10% red to 32 cm area surrounding the wound.  Small amt tan drainage and slightly fluctuant.  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  Large amt brown drainage and strong foul odor.  MRI indicates osteomyelitis. This complex medical condition is beyond the scope of practice for WOC nurses.  Secure chat message sent to the primary team to request surgical consult to determine if debridement is indicated.  Pressure Injury POA: Yes Dressing procedure/placement/frequency: Pt is on a low airloss mattress to reduce pressure. Hydrotherapy requested for physical therapy to perform to assist with removal of nonviable tissue twice a week.  Topical treatment orders provided as follows for bedside nurses to perform to assist with removal of nonviable tissue: Apply Medihoney to sacrum and left ischium wounds Q day, then cover with foam dressings: apply Medihoney to kerlex packing and fill left ischium with swab. Apply Medihoney to 2X2 and apply to sacrum. (Change foam dressings Q 3 days or PRN soiling.) Please re-consult if further assistance is needed.  Thank-you,  Cammie Mcgee MSN, RN, CWOCN, [place], CNS [phone]

## 2022-01-06 NOTE — Progress Notes (Signed)
[facility] Progress Note Patient: [name]:[id number] DOB: Mar 25, 1960 DOA: 01/04/2022  DOS: the patient was seen and examined on 01/06/2022  Brief hospital course: 61 year old male with past medical history of cerebral palsy nonverbal at baseline, wheelchair-bound, right hemiparesis with left-sided contracture presented to the hospital with complaints of decreased urination.  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  Along with fecal impaction. There was some concerns for UTI and a Foley catheter was changed in the ED. General surgery consulted.  ID will be consulted as well.  Currently on IV antibiotics. Assessment and Plan: Osteomyelitis of ischial tuberosity and coccyx. Unstageable pressure ulcers on sacrum and left ischial tuberosity.  Present on admission. Currently on IV vancomycin cefepime and Flagyl. We will continue the same. Follow-up on cultures. Wound care consulted. General surgery consult. May require hydrotherapy. We will consult ID as well.  UTI. Related to chronic indwelling Foley catheter. Follows up with urology outpatient. Foley catheter was changed in the ED. Currently on antibiotics. Urine still has some sediments.  History of cerebral palsy. Right hemiparesis. At risk for nonhealing wounds. Monitor.  Fecal impaction. Stercoral colitis. Patient had a smog enema. We will continue with suppository today.   Subjective: Unable to provide any history.  Nonverbal at his baseline.  Appears to be in pain on movement.  Physical Exam: General: in mild distress;  Cardiovascular: S1 and S2 Present, no Murmur Respiratory: normal respiratory effort, Bilateral Air entry present, no Crackles, no wheezes Abdomen: Bowel Sound present, distended, nontender Extremities: No edema Neurology: alert and non verbal unable to follow any commands.      Data Reviewed: I have Reviewed nursing notes, Vitals, and Lab results. Since  last encounter, pertinent lab results CBC and BMP   . I have ordered test including CBC, BMP, ESR, CRP  . I have discussed pt's care plan and test results with general surgery  . I have ordered imaging x-ray abdomen  .   Disposition: Status is: Inpatient Remains inpatient appropriate because: Need IV antibiotics  enoxaparin (LOVENOX) injection 40 mg Start: 01/05/22 1000   Family Communication: Sister at bedside Level of care: Med-Surg  Vitals:   01/05/22 1900 01/05/22 2147 01/06/22 0450 01/06/22 1242  BP:  104/75 (!) 126/93 132/89  Pulse:  81 83 82  Resp:  _0 Temp:  97.7 F (36.5 C) 98.2 F (36.8 C) 99.5 F (37.5 C)  TempSrc:  Oral Oral Oral  SpO2:  99% 99% 97%  Weight: 54.4 kg  51.3 kg     Author: Berle Mull, MD 01/06/2022 4:10 PM  Please look on [email or web address] to find out who is on call.

## 2022-01-06 NOTE — Plan of Care (Signed)

## 2022-01-06 NOTE — Consult Note (Signed)
[name] 1960-09-26  [id number].    Requesting MD: patel, MD Chief Complaint/Reason for Consult: sacral ulcer  HPI:  [name] is a 61 y/o M with PMH CP, paralysis, and nonverbal at baseline who presented to the ED with urinary retention and a decubitus ulcer. At baseline the patient lives at home with a caregiver. Sister currently at bedside. Per report the ulcer has been present for about one month.  CT of the pelvis performed in the ED showed soft tissue wound over left ischial tuberosity with necrotic tissue in the ulcer base without abscess but with gas concerning for possible osteomyelitis. Also showed significant constipation. 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 General surgery is asked to evaluate the sacral wound.  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   PONV (postoperative nausea and vomiting)     Past Surgical History:  Procedure Laterality Date   COLONOSCOPY WITH PROPOFOL N/A 08/26/2020   Procedure: COLONOSCOPY WITH PROPOFOL;  Surgeon: Clarene Essex, MD;  Location: [facility];  Service: Endoscopy;  Laterality: N/A;   ESOPHAGOGASTRODUODENOSCOPY (EGD) WITH PROPOFOL N/A 08/26/2020   Procedure: ESOPHAGOGASTRODUODENOSCOPY (EGD) WITH PROPOFOL;  Surgeon: Clarene Essex, MD;  Location: [facility];  Service: Endoscopy;  Laterality: N/A;   IR NEPHROSTOMY PLACEMENT RIGHT  01/03/2019   ROBOT ASSISTED LAPAROSCOPIC NEPHRECTOMY Right 02/24/2019   Procedure: XI ROBOTIC ASSISTED LAPAROSCOPIC NEPHRECTOMY;  Surgeon:  Cleon Gustin, MD;  Location: [facility];  Service: Urology;  Laterality: Right;  3 HRS   TENDON RELEASE      Social History:  reports that he has never smoked. He has never used smokeless tobacco. He reports that he does not drink alcohol and does not use drugs.  Allergies: No Known Allergies  Medications Prior to Admission  Medication Sig Dispense Refill   acetaminophen (TYLENOL) 325 MG tablet Take 325 mg by mouth every 6 (six) hours as needed for moderate pain or headache.     silodosin (RAPAFLO) 8 MG CAPS capsule Take 1 capsule (8 mg total) by mouth daily with breakfast. (Patient not taking: Reported on 01/04/2022) 30 capsule 11     Physical Exam: Blood pressure 132/89, pulse 82, temperature 99.5 F (37.5 C), temperature source Oral, resp. rate 17, weight 51.3 kg, SpO2 97 %. General: chronically ill appearing male in NAD HEENT: head -normocephalic, atraumatic; Eyes: pupils equal and round, anicteric sclerae Neck- Trachea is midline CV- RRR Pulm- breathing is non-labored on room air Abd- soft, NT/ND, no organomegaly MSK- BUE/BLE contractures GU-  Sacral wound: unstageable with overlying soft eschar, no significant cellulitis   Left ischial wound: wound tunnels about 6cm deep with palpable bone, majority of the visible wound with healthy granulation tissue and about 25% with some fibrinous exudate, there is a foul odor but no overt purulence or cellulitis     Results for orders placed or performed during the hospital encounter of 01/04/22 (from the past 48 hour(s))  Urinalysis, Routine w reflex microscopic     Status: Abnormal   Collection Time: 01/04/22  7:55 PM  Result Value Ref Range   Color, Urine AMBER (A) YELLOW    Comment: BIOCHEMICALS MAY BE AFFECTED BY COLOR   APPearance CLOUDY (A) CLEAR   Specific Gravity, Urine 1.013 1.005 - 1.030   pH 9.0 (H) 5.0 - 8.0   Glucose, UA NEGATIVE NEGATIVE mg/dL   Hgb urine dipstick NEGATIVE NEGATIVE   Bilirubin Urine NEGATIVE  NEGATIVE   Ketones, ur 5 (A) NEGATIVE mg/dL   Protein, ur >=628 (A) NEGATIVE mg/dL   Nitrite POSITIVE (A) NEGATIVE   Leukocytes,Ua LARGE (A) NEGATIVE   RBC / HPF 6-10 0 - 5 RBC/hpf   WBC, UA >50 (H) 0 - 5 WBC/hpf   Bacteria, UA RARE (A) NONE SEEN   Triple Phosphate Crystal PRESENT     Comment: Performed at [facility], [address], [place], [place] [address]  Basic metabolic panel     Status: Abnormal   Collection Time: 01/04/22  8:05 PM  Result Value Ref Range   Sodium 136 135 - 145 mmol/L   Potassium 4.0 3.5 - 5.1 mmol/L   Chloride 98 98 - 111 mmol/L   CO2 25 22 - 32 mmol/L   Glucose, Bld 110 (H) 70 - 99 mg/dL    Comment: Glucose reference range applies only to samples taken after fasting for at least 8 hours.   BUN 25 (H) 6 - 20 mg/dL   Creatinine, Ser 7.11 0.61 - 1.24 mg/dL   Calcium 8.7 (L) 8.9 - 10.3 mg/dL   GFR, Estimated >65 >79 mL/min    Comment: (NOTE) Calculated using the CKD-EPI Creatinine Equation (2021)    Anion gap 13 5 - 15    Comment: Performed at [facility], [address] [place], [place] [address]  CBC with Differential     Status: Abnormal   Collection Time: 01/04/22  8:05 PM  Result Value Ref Range   WBC 16.2 (H) 4.0 - 10.5 K/uL   RBC 4.51 4.22 - 5.81 MIL/uL   Hemoglobin 12.3 (L) 13.0 - 17.0 g/dL   HCT 38.3 29.1 - 91.6 %   MCV 88.7 80.0 - 100.0 fL   MCH 27.3 26.0 - 34.0 pg   MCHC 30.8 30.0 - 36.0 g/dL   RDW 60.6 00.4 - 59.9 %   Platelets 502 (H) 150 - 400 K/uL   nRBC 0.0 0.0 - 0.2 %   Neutrophils Relative % 85 %   Neutro Abs 13.7 (H) 1.7 - 7.7 K/uL   Lymphocytes Relative 8 %   Lymphs Abs 1.3 0.7 - 4.0 K/uL   Monocytes Relative 5 %   Monocytes Absolute 0.9 0.1 - 1.0 K/uL   Eosinophils Relative 1 %   Eosinophils Absolute 0.1 0.0 - 0.5 K/uL   Basophils Relative 0 %   Basophils Absolute 0.1 0.0 - 0.1 K/uL   Immature Granulocytes 1 %   Abs Immature Granulocytes 0.08 (H) 0.00 - 0.07 K/uL    Comment:  Performed at [facility] [address] [place], [place] [address]  Lactic acid, plasma     Status: None   Collection Time: 01/04/22  8:05 PM  Result Value Ref Range   Lactic Acid, Venous 1.8 0.5 - 1.9 mmol/L    Comment: Performed at [facility], [address], [place], [place] [address]  Glucose, capillary     Status: None  Collection Time: 01/05/22  3:31 AM  Result Value Ref Range   Glucose-Capillary 98 70 - 99 mg/dL    Comment: Glucose reference range applies only to samples taken after fasting for at least 8 hours.  Aerobic/Anaerobic Culture w Gram Stain (surgical/deep wound)     Status: None (Preliminary result)   Collection Time: 01/05/22  4:49 AM   Specimen: Wound  Result Value Ref Range   Specimen Description      WOUND SITE NOT SPECIFIED Performed at [facility] [address], [place], [place] [address]    Special Requests      NONE Performed at [facility], [place] [address], [place], [place] [address]    Gram Stain      ABUNDANT GRAM NEGATIVE RODS MODERATE GRAM POSITIVE RODS FEW GRAM POSITIVE COCCI IN CHAINS FEW WBC PRESENT, PREDOMINANTLY PMN Performed at [facility], [place] [address], [place], [place] [address]    Culture PENDING    Report Status PENDING   CBC     Status: Abnormal   Collection Time: 01/05/22  4:57 AM  Result Value Ref Range   WBC 10.7 (H) 4.0 - 10.5 K/uL   RBC 4.11 (L) 4.22 - 5.81 MIL/uL   Hemoglobin 11.3 (L) 13.0 - 17.0 g/dL   HCT 36.8 (L) 39.0 - 52.0 %   MCV 89.5 80.0 - 100.0 fL   MCH 27.5 26.0 - 34.0 pg   MCHC 30.7 30.0 - 36.0 g/dL   RDW 12.8 11.5 - 15.5 %   Platelets 461 (H) 150 - 400 K/uL   nRBC 0.0 0.0 - 0.2 %    Comment: Performed at [facility], [place] [address] [place], [place] [id number]  Basic metabolic panel     Status: Abnormal   Collection Time: 01/05/22  4:57 AM  Result Value Ref Range   Sodium 137 135 - 145 mmol/L    Potassium 3.8 3.5 - 5.1 mmol/L   Chloride 102 98 - 111 mmol/L   CO2 23 22 - 32 mmol/L   Glucose, Bld 97 70 - 99 mg/dL    Comment: Glucose reference range applies only to samples taken after fasting for at least 8 hours.   BUN 25 (H) 6 - 20 mg/dL   Creatinine, Ser 0.89 0.61 - 1.24 mg/dL   Calcium 8.4 (L) 8.9 - 10.3 mg/dL   GFR, Estimated >60 >60 mL/min    Comment: (NOTE) Calculated using the CKD-EPI Creatinine Equation (2021)    Anion gap 12 5 - 15    Comment: Performed at [facility], [place] [address] [place], [place] [address]  MRSA Next Gen by PCR, Nasal     Status: None   Collection Time: 01/05/22  5:18 AM   Specimen: Nasal Mucosa; Nasal Swab  Result Value Ref Range   MRSA by PCR Next Gen NOT DETECTED NOT DETECTED    Comment: (NOTE) The GeneXpert MRSA Assay (FDA approved for NASAL specimens only), is one component of a comprehensive MRSA colonization surveillance program. It is not intended to diagnose MRSA infection nor to guide or monitor treatment for MRSA infections. Test performance is not FDA approved in patients less than 69 years old. Performed at [facility], [place] [address] [place], [place] [address]   CBC with Differential/Platelet     Status: Abnormal   Collection Time: 01/05/22 11:57 AM  Result Value Ref Range   WBC 9.0 4.0 - 10.5 K/uL   RBC  3.91 (L) 4.22 - 5.81 MIL/uL   Hemoglobin 10.6 (L) 13.0 - 17.0 g/dL   HCT 34.7 (L) 39.0 - 52.0 %   MCV 88.7 80.0 - 100.0 fL   MCH 27.1 26.0 - 34.0 pg   MCHC 30.5 30.0 - 36.0 g/dL   RDW 12.8 11.5 - 15.5 %   Platelets 433 (H) 150 - 400 K/uL   nRBC 0.0 0.0 - 0.2 %   Neutrophils Relative % 80 %   Neutro Abs 7.2 1.7 - 7.7 K/uL   Lymphocytes Relative 11 %   Lymphs Abs 1.0 0.7 - 4.0 K/uL   Monocytes Relative 6 %   Monocytes Absolute 0.6 0.1 - 1.0 K/uL   Eosinophils Relative 2 %   Eosinophils Absolute 0.2 0.0 - 0.5 K/uL   Basophils Relative 1 %   Basophils Absolute 0.1 0.0 - 0.1 K/uL    Immature Granulocytes 0 %   Abs Immature Granulocytes 0.02 0.00 - 0.07 K/uL    Comment: Performed at [facility], [place] [address], [place], [place] [address]  Renal function panel     Status: Abnormal   Collection Time: 01/05/22 11:57 AM  Result Value Ref Range   Sodium 136 135 - 145 mmol/L   Potassium 3.8 3.5 - 5.1 mmol/L   Chloride 102 98 - 111 mmol/L   CO2 26 22 - 32 mmol/L   Glucose, Bld 91 70 - 99 mg/dL    Comment: Glucose reference range applies only to samples taken after fasting for at least 8 hours.   BUN 23 (H) 6 - 20 mg/dL   Creatinine, Ser 0.85 0.61 - 1.24 mg/dL   Calcium 8.2 (L) 8.9 - 10.3 mg/dL   Phosphorus 2.7 2.5 - 4.6 mg/dL   Albumin 2.5 (L) 3.5 - 5.0 g/dL   GFR, Estimated >60 >60 mL/min    Comment: (NOTE) Calculated using the CKD-EPI Creatinine Equation (2021)    Anion gap 8 5 - 15    Comment: Performed at [facility], [place] [address] [place], [place] [address]  Urine Culture     Status: Abnormal   Collection Time: 01/05/22  3:31 PM   Specimen: Urine, Catheterized  Result Value Ref Range   Specimen Description      URINE, CATHETERIZED Performed at [facility] [address] [place], [place] [address]    Special Requests      NONE Performed at [facility], [place] [address] [place], [place] [address]    Culture MULTIPLE SPECIES PRESENT, SUGGEST RECOLLECTION (A)    Report Status 01/06/2022 FINAL    DG Abd Portable 1V  Result Date: 01/06/2022 CLINICAL DATA:  61 year old male presents for evaluation of constipation. Imaging limited by patient condition. EXAM: PORTABLE ABDOMEN - 1 VIEW COMPARISON:  CT abdomen and pelvis acquired on January 04, 2022. FINDINGS: Image rotated to the RIGHT. Lower pelvis and symphysis pubis not imaged. Abundant stool again noted in the rectum. Moderate stool in the ascending colon. Scattered loops of mildly dilated gas-filled small bowel. No abnormal  calcifications. Soft tissues are grossly normal. On limited assessment no acute skeletal findings. IMPRESSION: 1. Abundant stool again noted in the rectum. Moderate stool in the ascending colon. 2. Scattered loops of mildly dilated gas-filled small bowel. Findings likely reflect developing ileus. Correlate with any signs of fecal impaction. Electronically Signed   By: Zetta Bills M.D.   On: 01/06/2022 09:28   MR PELVIS W WO CONTRAST  Result Date: 01/05/2022  CLINICAL DATA:  Left ischial decubitus ulcer, query osteomyelitis EXAM: MRI PELVIS WITHOUT AND WITH CONTRAST TECHNIQUE: Multiplanar multisequence MR imaging of the pelvis was performed both before and after administration of intravenous contrast. CONTRAST:  5.53mL GADAVIST GADOBUTROL 1 MMOL/ML IV SOLN COMPARISON:  CT pelvis 01/04/2022 FINDINGS: Urinary Tract: Thick-walled urinary bladder with Foley catheter in place. Cystitis not excluded. Bowel: Prominence of gas and stool in the rectum as on the CT scan suggesting fecal impaction. There is some trace perirectal edema, cannot exclude low-grade stercoral colitis although rectal wall enhancement pattern is normal. Vascular/Lymphatic: Unremarkable Reproductive: Mildly asymmetric reduced T2 signal in the left posterolateral peripheral zone of the prostate gland, nonspecific. Small right scrotal hydrocele. Other: Trace presacral edema. There is potentially a small amount of right eccentric ascites along the right paracolic gutter on uppermost images. Musculoskeletal: Left ischial osteomyelitis noted with abnormal enhancement and edema in the left ischium underlying a grade 4 decubitus ulcer which extends all the way to the periosteal margin as shown on image 34 series 15 no overt bony resorption. There is abnormal edema the adjacent gluteal and obturator internus muscles along the margins of the ulcer. No definite connectivity with the left hip joint. There is likewise a decubitus ulcer posteriorly overlying  the coccyx on image 30 of series 15, extending all the way down to the level of the coccyx. There is associated abnormal edema and enhancement in the coccyx on images 29-30 of series 13 compatible with coccygeal osteomyelitis. Trace edema along the pubic bones inferiorly, likely due to low-grade arthropathy. IMPRESSION: 1. Grade 4 decubitus ulcer posterior to the left ischium with underlying left ischial osteomyelitis. There is also a grade 4 decubitus ulcer posteriorly overlying the coccyx with associated coccygeal osteomyelitis. Adjacent edema and enhancement in involve musculature including gluteal and left obturator internus musculature. 2. Thick-walled urinary bladder with Foley catheter in place. Cystitis not excluded. 3. Prominence of gas and stool in the rectum as on the CT scan suggesting fecal impaction. There is some trace perirectal edema, cannot exclude low-grade stercoral colitis although rectal wall enhancement pattern is normal. 4. Small right scrotal hydrocele. 5. Mildly asymmetric reduced T2 signal in the left posterolateral peripheral zone of the prostate gland, nonspecific. Electronically Signed   By: Van Clines M.D.   On: 01/05/2022 11:56   CT PELVIS LIMITED WO CONTRAST  Result Date: 01/04/2022 CLINICAL DATA:  Gluteal decubitus ulcer. EXAM: CT PELVIS WITHOUT CONTRAST TECHNIQUE: Multidetector CT imaging of the pelvis was performed following the standard protocol without intravenous contrast. RADIATION DOSE REDUCTION: This exam was performed according to the departmental dose-optimization program which includes automated exposure control, adjustment of the mA and/or kV according to patient size and/or use of iterative reconstruction technique. COMPARISON:  CT examination performed earlier on the same date. FINDINGS: Urinary Tract: Thick walled urinary bladder with Foley's catheter in place. Bowel: Large amount of stool in the rectum suggesting fecal impaction/constipation.  Vascular/Lymphatic: No pathologically enlarged lymph nodes. No significant vascular abnormality seen. Reproductive:  No mass or other significant abnormality Other:  None. Musculoskeletal: Large deep skin wound about the left ischial tuberosity with necrotic tissue in the ulcer base. The ulcer crater measures approximately 4.0 x 3.4 cm. There are small pockets of gas ischial tuberosity without evidence of osseous erosion. IMPRESSION: 1. Large deep skin wound about the left ischial tuberosity with necrotic tissue in the ulcer base. The ulcer crater measures approximately 4.0 x 3.4 cm. There are small pockets of gas without evidence of osseous  erosion. Osteomyelitis of the ischial tuberosity can not be excluded. 2. Large amount of stool in the rectum suggesting fecal impaction/constipation. 3. Thick walled urinary bladder with Foley's catheter in place. Cystitis/urinary tract infection can not be excluded. Correlate with urinalysis. Electronically Signed   By: Keane Police D.O.   On: 01/04/2022 23:28   CT ABDOMEN PELVIS WO CONTRAST  Result Date: 01/04/2022 CLINICAL DATA:  Acute nonlocalized abdominal pain. Left buttock pressure ulcer. Foley catheter malfunction EXAM: CT ABDOMEN AND PELVIS WITHOUT CONTRAST TECHNIQUE: Multidetector CT imaging of the abdomen and pelvis was performed following the standard protocol without IV contrast. RADIATION DOSE REDUCTION: This exam was performed according to the departmental dose-optimization program which includes automated exposure control, adjustment of the mA and/or kV according to patient size and/or use of iterative reconstruction technique. COMPARISON:  10/07/2021 FINDINGS: Lower chest: Elevation of the right hemidiaphragm noted. Visualized lung bases are clear. Visualized heart and pericardium are unremarkable. Hepatobiliary: No focal liver abnormality is seen. No gallstones, gallbladder wall thickening, or biliary dilatation. Pancreas: Unremarkable Spleen: Unremarkable  Adrenals/Urinary Tract: The adrenal glands are unremarkable. Status post right nephrectomy. The Jew[place] left kidney is unremarkable on this noncontrast examination. Foley catheter balloon seen within a decompressed bladder lumen. There is marked circumferential bladder wall thickening and perivesicular inflammatory stranding suggesting a superimposed infectious or inflammatory cystitis. No perivesicular fluid collections are identified. Stomach/Bowel: There is large volume stool within the a rectosigmoid colon and rectal vault suggesting changes of fecal impaction. Stomach, small bowel, and large bowel are otherwise unremarkable. The appendix is not clearly identified. No free intraperitoneal gas or fluid. Vascular/Lymphatic: No significant vascular findings are present. No enlarged abdominal or pelvic lymph nodes. Reproductive: Prostate gland is unremarkable. Other: Tiny fat containing umbilical hernia. Musculoskeletal: The pelvis and gluteal soft tissues are incompletely included on this examination. Reported left gluteal ulcer is not clearly identified on this exam. Visualized osseous structures are unremarkable. No acute bone abnormality. IMPRESSION: 1. Foley catheter balloon within a decompressed bladder lumen. Marked circumferential bladder wall thickening and perivesicular inflammatory stranding suggesting a superimposed infectious or inflammatory cystitis. Correlation with urinalysis and urine culture may be helpful. 2. Large volume stool within the rectosigmoid colon and rectal vault suggesting changes of fecal impaction. 3. Left gluteal decubitus wound not clearly identified on this examination, possibly excluded from view. If indicated, repeat imaging may be performed at no charge to better assess the gluteal soft tissues. Electronically Signed   By: Fidela Salisbury M.D.   On: 01/04/2022 21:35      Assessment/Plan Sacral and ischial decubitus ulcers with underlying osteomyelitis - No indication for  acute surgical intervention. Agree with WOC recommendations for PT hydrotherapy and BID wet to dry dressing changes with medi-honey. Recommend ID consult if needed for osteomyelitis.  Constipation - schedule stool softeners, miralax, consider enema    FEN - Reg VTE - SCD's, lovenox ID - cefepime, vanc Admit - TRH serivce  Cerebral palsy, nonverbal, nonambulatory  UTI   I reviewed hospitalist notes, last 24 h vitals and pain scores, last 48 h intake and output, last 24 h labs and trends, and last 24 h imaging results.  Wellington Hampshire, [facility] 01/06/2022, 4:14 PM Please see Amion for pager number during day hours 7:00am-4:30pm or 7:00am -11:30am on weekends

## 2022-01-07 DIAGNOSIS — T148XXA Other injury of unspecified body region, initial encounter: Secondary | ICD-10-CM | POA: Diagnosis not present

## 2022-01-07 DIAGNOSIS — G809 Cerebral palsy, unspecified: Secondary | ICD-10-CM

## 2022-01-07 DIAGNOSIS — L089 Local infection of the skin and subcutaneous tissue, unspecified: Secondary | ICD-10-CM | POA: Diagnosis not present

## 2022-01-07 DIAGNOSIS — L89159 Pressure ulcer of sacral region, unspecified stage: Secondary | ICD-10-CM | POA: Diagnosis not present

## 2022-01-07 DIAGNOSIS — M869 Osteomyelitis, unspecified: Secondary | ICD-10-CM

## 2022-01-07 LAB — BASIC METABOLIC PANEL
Anion gap: 8 (ref 5–15)
BUN: 19 mg/dL (ref 6–20)
CO2: 23 mmol/L (ref 22–32)
Calcium: 8.3 mg/dL — ABNORMAL LOW (ref 8.9–10.3)
Chloride: 103 mmol/L (ref 98–111)
Creatinine, Ser: 0.87 mg/dL (ref 0.61–1.24)
GFR, Estimated: >60 mL/min (ref >60)
Glucose, Bld: 131 mg/dL — ABNORMAL HIGH (ref 70–99)
Potassium: 3.7 mmol/L (ref 3.5–5.1)
Sodium: 134 mmol/L — ABNORMAL LOW (ref 135–145)

## 2022-01-07 LAB — CBC
HCT: 32.3 % — ABNORMAL LOW (ref 39.0–52.0)
Hemoglobin: 9.7 g/dL — ABNORMAL LOW (ref 13.0–17.0)
MCH: 26.6 pg (ref 26.0–34.0)
MCHC: 30 g/dL (ref 30.0–36.0)
MCV: 88.7 fL (ref 80.0–100.0)
Platelets: 412 10*3/uL — ABNORMAL HIGH (ref 150–400)
RBC: 3.64 MIL/uL — ABNORMAL LOW (ref 4.22–5.81)
RDW: 12.6 % (ref 11.5–15.5)
WBC: 8 10*3/uL (ref 4.0–10.5)
nRBC: 0 % (ref 0.0–0.2)

## 2022-01-07 LAB — C-REACTIVE PROTEIN: CRP: 7.5 mg/dL — ABNORMAL HIGH (ref <1.0)

## 2022-01-07 LAB — MAGNESIUM: Magnesium: 2.3 mg/dL (ref 1.7–2.4)

## 2022-01-07 LAB — SEDIMENTATION RATE: Sed Rate: 90 mm/hr — ABNORMAL HIGH (ref 0–16)

## 2022-01-07 MED ORDER — JUVEN PO PACK
1.0000 | PACK | Freq: Two times a day (BID) | ORAL | Status: DC
  Administered 2022-01-07 – 2022-01-13 (×12): 1 via ORAL
  Filled 2022-01-07 (×12): qty 1

## 2022-01-07 MED ORDER — SORBITOL 70 % SOLN
960.0000 mL | TOPICAL_OIL | Freq: Once | ORAL | Status: AC
  Administered 2022-01-07: 960 mL via RECTAL
  Filled 2022-01-07: qty 240

## 2022-01-07 MED ORDER — ADULT MULTIVITAMIN W/MINERALS CH
1.0000 | ORAL_TABLET | Freq: Every day | ORAL | Status: DC
  Administered 2022-01-07 – 2022-01-13 (×7): 1 via ORAL
  Filled 2022-01-07 (×7): qty 1

## 2022-01-07 MED ORDER — METRONIDAZOLE 500 MG PO TABS
500.0000 mg | ORAL_TABLET | Freq: Two times a day (BID) | ORAL | Status: DC
  Administered 2022-01-07 – 2022-01-13 (×12): 500 mg via ORAL
  Filled 2022-01-07 (×12): qty 1

## 2022-01-07 MED ORDER — SODIUM CHLORIDE 0.9 % IV SOLN
2.0000 g | INTRAVENOUS | Status: DC
  Administered 2022-01-07 – 2022-01-13 (×7): 2 g via INTRAVENOUS
  Filled 2022-01-07 (×7): qty 20

## 2022-01-07 MED ORDER — SODIUM CHLORIDE 0.9 % IV SOLN
8.0000 mg/kg | Freq: Every day | INTRAVENOUS | Status: DC
  Administered 2022-01-07 – 2022-01-13 (×7): 400 mg via INTRAVENOUS
  Filled 2022-01-07 (×8): qty 8

## 2022-01-07 MED ORDER — METRONIDAZOLE 500 MG/100ML IV SOLN
500.0000 mg | Freq: Two times a day (BID) | INTRAVENOUS | Status: DC
  Administered 2022-01-07: 500 mg via INTRAVENOUS
  Filled 2022-01-07: qty 100

## 2022-01-07 MED ORDER — ENSURE MAX PROTEIN PO LIQD
11.0000 [oz_av] | Freq: Every day | ORAL | Status: DC
  Administered 2022-01-08 – 2022-01-13 (×6): 11 [oz_av] via ORAL

## 2022-01-07 NOTE — Progress Notes (Signed)
[facility] Progress Note Patient: [name]:[id number] DOB: 08/18/1960 DOA: 01/04/2022  DOS: the patient was seen and examined on 01/07/2022  Brief hospital course: 61 year old male with past medical history of cerebral palsy nonverbal at baseline, wheelchair-bound, right hemiparesis with left-sided contracture presented to the hospital with complaints of decreased urination.  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  Along with fecal impaction. There was some concerns for UTI and a Foley catheter was changed in the ED. General surgery consulted.  ID consulted as well.  Currently on IV antibiotics.  We will start hydrotherapy in the hospital. Assessment and Plan: Osteomyelitis of ischial tuberosity and coccyx. Unstageable pressure ulcers on sacrum and left ischial tuberosity.  Present on admission. Currently on IV vancomycin cefepime and Flagyl. Follow-up on cultures.  So far negative. General surgery consult.  Recommend hydrotherapy. ID consulted. Currently on IV antibiotics.  Outpatient patient will be on daptomycin and ceftriaxone. Dietitian.   UTI. Related to chronic indwelling Foley catheter. Follows up with urology outpatient. Foley catheter was changed in the ED on admission. Currently on antibiotics. Urine still has some sediments.   History of cerebral palsy. Right hemiparesis. At risk for nonhealing wounds. Monitor.   Fecal impaction. Stercoral colitis. Patient had a smog enema.  1 more smog enema today on 11/14.   Subjective: Patient unable to provide any history.  Sister at bedside reports some gagging episodes earlier in the day.  No nausea no vomiting.  Tolerating 100% of meals.  Passing gas.  Physical Exam: General: in mild distress;  Cardiovascular: S1 and S2 Present, no Murmur Respiratory: normal  respiratory effort, Bilateral Air entry present, no Crackles, no wheezes Abdomen: Bowel Sound present, periumbilical  tenderness Extremities: no edema Neurology: alert and non verbal   Data Reviewed: I have Reviewed nursing notes, Vitals, and Lab results. Since last encounter, pertinent lab results CBC and BMP   . I have ordered test including CBC and BMP  . I have discussed pt's care plan and test results with ID  .   Disposition: Status is: Inpatient Continue IV antibiotics.  Will need hydrotherapy for at least a few days  enoxaparin (LOVENOX) injection 40 mg Start: 01/05/22 1000   Family Communication: Sister at bedside Level of care: Med-Surg  Vitals:   01/06/22 1242 01/06/22 2008 01/07/22 0425 01/07/22 1127  BP: 132/89 (!) 136/100 105/75 97/65  Pulse: 82 88 70 69  Resp: 17 14 18 18   Temp: 99.5 F (37.5 C) 98.2 F (36.8 C) 98.5 F (36.9 C) 98.3 F (36.8 C)  TempSrc: Oral Oral Oral Oral  SpO2: 97% 97% 97%   Weight:         Author: , MD 01/07/2022 1:20 PM  Please look on [email or web address] to find out who is on call.

## 2022-01-07 NOTE — Consult Note (Signed)
[facility] for Infectious Disease    Date of Admission:  01/04/2022     Reason for Consult: Sacral wound and OM     Referring Physician: Dr Posey Pronto  Current antibiotics: Vancomycin Cefepime  ASSESSMENT:    61 y.o. 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   # Cerebral palsy  RECOMMENDATIONS:    Change antibiotics to daptomycin, ceftriaxone, and metronidazole Discussed at length with patient and sister that antibiotics alone with this type of infection are of low utility without multidisciplinary approach of debridement, off loading, wound care, diversion, possible flap coverage in the future.  She seems to have good understanding of this Would refer to wound care, general, and plastics at discharge.  Ultimately may be better served at a tertiary center for this type of care Place PICC line Plan for 2 weeks of IV therapy up front followed by 4 weeks of doxycycline and Augmentin to finish 6 weeks total See OPAT note Signing off, please call as needed  Diagnosis: Sacral OM  Culture Result: None  No Known Allergies  OPAT Orders Discharge antibiotics to be given via PICC line Discharge antibiotics: Per pharmacy protocol  Daptomycin 485m IV daily plus ceftriaxone 2g IV daily plus flagyl 5068mPO BID    Duration: 2 weeks End Date: [facility]/20/22  [facility]  Home health RN for IV administration and teaching; PICC line care and labs.    Labs weekly while on IV antibiotics: _xx_ CBC with differential _xx_ BMP __ CMP _xx_ CRP _xx_ ESR __ Vancomycin trough __ CK  _xx_ Please pull PIC at completion of IV antibiotics __ Please leave PIC in place until doctor has seen patient or been notified  Fax weekly labs to ([phone] Follow Up Appt: 01/20/22 at 11:15pm    Principal Problem:   Wound infection Active Problems:   Pressure ulcer   Cerebral palsy  (HCC)   UTI (urinary tract infection)   Osteomyelitis (HCC)   MEDICATIONS:    Scheduled Meds:  bisacodyl  10 mg Rectal Q1200   enoxaparin (LOVENOX) injection  40 mg Subcutaneous Q24H   leptospermum manuka honey  1 Application Topical Daily   magnesium hydroxide  15 mL Oral Daily   metroNIDAZOLE  500 mg Oral Q12H   multivitamin with minerals  1 tablet Oral Daily   nutrition supplement (JUVEN)  1 packet Oral BID BM   polyethylene glycol  17 g Oral BID   [START ON 01/08/2022] Ensure Max Protein  11 oz Oral Daily   senna-docusate  1 tablet Oral BID   sorbitol, milk of mag, mineral oil, glycerin (SMOG) enema  960 mL Rectal Once   Continuous Infusions:  sodium chloride     cefTRIAXone (ROCEPHIN)  IV     DAPTOmycin (CUBICIN) 400 mg in sodium chloride 0.9 % IVPB     PRN Meds:.sodium chloride, sodium phosphate  HPI:    Brett FRONCZAKs a [age].o. male with PMHx of cerebral palsy, wheelchair bound who was admitted 01/04/22 with initial concerns for UTI in the setting of chronic indwelling Foley.  He was found to have large sacral wounds that have developed with in the past few weeks per his sister.  Imaging was done with MRI showing osteomyelitis of the left ischial and coccyx.  He was started on antibiotics.  Wound care recommended surgery consult.  Surgery recommended hydrotherapy and dressing changes.  We  have been consulted for antibiotic recommendations.   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   PONV (postoperative nausea and vomiting)     Social History   Tobacco Use   Smoking status: Never   Smokeless tobacco: Never  Vaping Use   Vaping Use: Never used  Substance Use Topics   Alcohol use: No   Drug use: No    Family History  Problem Relation Age of Onset   Diabetes Mother    Heart failure  Mother    Hypertension Mother    Diabetes Father    Heart failure Father    Hypertension Father    Cancer Father     No Known Allergies  Review of Systems  Unable to perform ROS: Patient nonverbal    OBJECTIVE:   Blood pressure 97/65, pulse 69, temperature 98.3 F (36.8 C), temperature source Oral, resp. rate 18, weight 51.3 kg, SpO2 97 %. Body mass index is 17.18 kg/m.  Physical Exam Constitutional:      Comments: Chronically ill appearing, lying in bed, NAD.  Non verbal .   HENT:     Head: Normocephalic and atraumatic.  Eyes:     Extraocular Movements: Extraocular movements intact.     Conjunctiva/sclera: Conjunctivae normal.  Pulmonary:     Effort: Pulmonary effort is normal. No respiratory distress.  Abdominal:     General: There is no distension.     Palpations: Abdomen is soft.  Musculoskeletal:     Comments: Images in chart reviewed.   Skin:    General: Skin is warm and dry.  Neurological:     General: No focal deficit present.     Mental Status: Mental status is at baseline.  Psychiatric:        Mood and Affect: Mood normal.        Behavior: Behavior normal.      Lab Results: Lab Results  Component Value Date   WBC 8.0 01/07/2022   HGB 9.7 (L) 01/07/2022   HCT 32.3 (L) 01/07/2022   MCV 88.7 01/07/2022   PLT 412 (H) 01/07/2022    Lab Results  Component Value Date   NA 134 (L) 01/07/2022   K 3.7 01/07/2022   CO2 23 01/07/2022   GLUCOSE 131 (H) 01/07/2022   BUN 19 01/07/2022   CREATININE 0.87 01/07/2022   CALCIUM 8.3 (L) 01/07/2022   GFRNONAA >60 01/07/2022   GFRAA >60 02/25/2019    Lab Results  Component Value Date   ALT 23 08/22/2020   AST 19 08/22/2020   ALKPHOS 86 08/22/2020   BILITOT 0.3 08/22/2020       Component Value Date/Time   CRP 7.5 (H) 01/07/2022 0422       Component Value Date/Time   ESRSEDRATE 90 (H) 01/07/2022 0422    I have reviewed the micro and lab results in Epic.  Imaging: DG Abd Portable 1V  Result  Date: 01/06/2022 CLINICAL DATA:  61 year old male presents for evaluation of constipation. Imaging limited by patient condition. EXAM: PORTABLE ABDOMEN - 1 VIEW COMPARISON:  CT abdomen and pelvis acquired on January 04, 2022. FINDINGS: Image rotated to the RIGHT. Lower pelvis and symphysis pubis not imaged. Abundant stool again noted in the rectum. Moderate stool in the ascending colon. Scattered loops of mildly dilated gas-filled  small bowel. No abnormal calcifications. Soft tissues are grossly normal. On limited assessment no acute skeletal findings. IMPRESSION: 1. Abundant stool again noted in the rectum. Moderate stool in the ascending colon. 2. Scattered loops of mildly dilated gas-filled small bowel. Findings likely reflect developing ileus. Correlate with any signs of fecal impaction. Electronically Signed   By: Zetta Bills M.D.   On: 01/06/2022 09:28     Imaging independently reviewed in Epic.  [facility] Infectious [facility] [phone] pager 01/07/2022, 3:38 PM

## 2022-01-07 NOTE — TOC Initial Note (Signed)
Transition of Care [facility]) - Initial/Assessment Note    Patient Details  Name: [name] MRN: [id number] Date of Birth: [birth year]  Transition of Care [facility]) CM/SW Contact:    Beckie Busing, RN Phone [phone]  01/07/2022, 8:26 AM  Clinical Narrative:                  Transition of Care [facility]) Screening Note   Patient Details  Name: [name] Date of Birth: Jan 16, 1961   Transition of Care [facility]) CM/SW Contact:    Beckie Busing, RN Phone Number: 01/07/2022, 8:26 AM    Transition of Care Department [facility]) has reviewed patient and no TOC needs have been identified at this time. We will continue to monitor patient advancement through interdisciplinary progression rounds. If new patient transition needs arise, please place a TOC consult.          Patient Goals and CMS Choice        Expected Discharge Plan and Services                                                Prior Living Arrangements/Services                       Activities of Daily Living Home Assistive Devices/Equipment: Wheelchair ADL Screening (condition at time of admission) Patient's cognitive ability adequate to safely complete daily activities?: No Is the patient deaf or have difficulty hearing?: No Does the patient have difficulty seeing, even when wearing glasses/contacts?: No Does the patient have difficulty concentrating, remembering, or making decisions?: No Patient able to express need for assistance with ADLs?: Yes Does the patient have difficulty dressing or bathing?: Yes Independently performs ADLs?: No Communication: Dependent Is this a change from baseline?: Pre-admission baseline Dressing (OT): Dependent Is this a change from baseline?: Pre-admission baseline Grooming: Dependent Is this a change from baseline?: Pre-admission baseline Feeding: Needs assistance Is this a change from baseline?: Pre-admission baseline Bathing: Needs assistance Is  this a change from baseline?: Pre-admission baseline Toileting: Needs assistance Is this a change from baseline?: Pre-admission baseline In/Out Bed: Dependent Is this a change from baseline?: Pre-admission baseline Walks in Home: Dependent Is this a change from baseline?: Pre-admission baseline Does the patient have difficulty walking or climbing stairs?: Yes Weakness of Legs: Both Weakness of Arms/Hands: Both  Permission Sought/Granted                  Emotional Assessment              Admission diagnosis:  Urinary retention [R33.9] Wound infection [T14.8XXA, L08.9] Urinary tract infection associated with catheterization of urinary tract, unspecified indwelling urinary catheter type, initial encounter (HCC) [W29.562Z, N39.0] Pressure injury of skin of left buttock, unspecified injury stage [L89.329] Pressure injury of skin of sacral region, unspecified injury stage [L89.159] Osteomyelitis [facility]) [M86.9] Patient Active Problem List   Diagnosis Date Noted   Osteomyelitis (HCC) 01/06/2022   Wound infection 01/04/2022   UTI (urinary tract infection) 01/04/2022   GI bleed 08/23/2020   Acute anemia 08/22/2020   Pressure ulcer 08/22/2020   Cerebral palsy (HCC) 08/22/2020   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

## 2022-01-07 NOTE — Progress Notes (Signed)
Initial Nutrition Assessment  INTERVENTION:  -Continue regular diet as tolerated -Provide Ensure Max q day (150kcal, 30g protein) -Provide 1 packet Juven BID, each packet provides 95 calories, 2.5 grams of protein (collagen), and 9.8 grams of carbohydrate (3 grams sugar); also contains 7 grams of L-arginine and L-glutamine, 300 mg vitamin C, 15 mg vitamin E, 1.2 mcg vitamin B-12, 9.5 mg zinc, 200 mg calcium, and 1.5 g  Calcium Beta-hydroxy-Beta-methylbutyrate to support wound healing -Provide daily MVI  NUTRITION DIAGNOSIS:  Increased nutrient needs related to wound healing as evidenced by estimated needs (increased metabolic need for healing).  GOAL:  Patient will meet greater than or equal to 90% of their needs  MONITOR:  PO intake, Supplement acceptance  REASON FOR ASSESSMENT:  Malnutrition Screening Tool    ASSESSMENT:  Pt is a 61yo M with PMH of CP nonverbal at baseline, wheelchair-bound, right hemiparesis with left-sided contracture presented to the hospital with complaints of decreased urination and wound infection. No surgical intervention planned at this time.  Visited pt at bedside with sister present assisting with lunch. She reports pt is a great eater. Po documentation confirms pt is eating 75-100% meals during admission. Sister reports it is difficult to weigh patient at home so she is unsure if he has lost any weight. Chart review shows a significant 11.7% unintended weight loss in the last 3 months. NFPE unreliable due to CP. Unable to determine malnutrition diagnosis at this time. 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 Pt with increased macronutrient and micronutrient needs. Discussed ONS options with sister. Recommend 1 packet Juven BID, to provide 95 calories, 2.5 grams of protein (collagen), 9.8 grams of carbohydrate, 7 grams of L-arginine and L-glutamine, 300 mg vitamin C, 15 mg vitamin E, 1.2 mcg  vitamin B-12, 9.5 mg zinc, 200 mg calcium, and 1.5 g Calcium Beta-hydroxy-Beta-methylbutyrate to support wound healing. Pt would also benefit from daily MVI. Discussed also trial of Ensure Max High Protein q day (150kcal, 30g protein). Supplements spread out throughout the day to avoid interference with intake of solid foods. RD to follow up to determine nutrition plan for home once closer to discharge.  Could consider vitamin labs, however with elevated CRP lab values will likely be skewed.  Medications reviewed and include: dulcolax, milk of mag, miralax, senna-docusate, cefepime, flagyl, vancomycin  Labs reviewed: Na:134, BG:131, CRP:7.5  NUTRITION - FOCUSED PHYSICAL EXAM: NFPE not reliable in patients contracted with cerebral palsy  Diet Order:   Diet Order             Diet regular Room service appropriate? Yes; Fluid consistency: Thin  Diet effective now                   EDUCATION NEEDS:   Education needs have been addressed  Skin:  Skin Assessment: Skin Integrity Issues: Skin Integrity Issues:: Unstageable, Stage IV Stage IV: Left ischium Unstageable: sacrum  Last BM:  11/13  Height:  Ht Readings from Last 1 Encounters:  10/07/21 5\' 8"  (1.727 m)   Weight:  Wt Readings from Last 1 Encounters:  01/06/22 51.3 kg    BMI:  Body mass index is 17.18 kg/m.  Estimated Nutritional Needs:  Kcal:  1540-1795kcal Protein:  75-105g Fluid:  1540-1767mL  97m, MS, RD, LDN, CNSC See AMiON for contact information

## 2022-01-07 NOTE — Progress Notes (Addendum)
01/07/22   Hydrotherapy evaluation [id number]  Subjective Assessment  Subjective pt nonverbal, sister present for information  Patient and Family Stated Goals for wounds to heal  Date of Onset  (present on admit)  Prior Treatments family dressings changed  Evaluation and Treatment  Evaluation and Treatment Procedures Explained to Patient/Family Yes  Evaluation and Treatment Procedures agreed to (by sister)  Pressure Injury 01/07/22 Left ischial Stage 4 - Full thickness tissue loss with exposed bone, tendon or muscle.  malodorous with slough-PT ONLY  Date First Assessed/Time First Assessed: 01/07/22 1130   Location:  Left;Medial  Staging: Stage 4 - Full thickness tissue loss with exposed bone, tendon or muscle.  Wound Description (Comments): left ischial  wo...  Wound Image  (Lt ischial)  Dressing Type Gauze (Comment);Foam - Lift dressing to assess site every shift;Honey  Dressing Clean, Dry, Intact  Dressing Change Frequency Twice a day  State of Healing Non-healing  Site / Wound Assessment Brown;Painful;Yellow  % Wound base Red or Granulating 0%  % Wound base Yellow/Fibrinous Exudate 55%  % Wound base Black/Eschar 45%  Peri-wound Assessment Excoriated  Wound Length (cm) 8 cm  Wound Width (cm) 5 cm  Wound Depth (cm) 3 cm  Wound Surface Area (cm^2) 40 cm^2  Wound Volume (cm^3) 120 cm^3  Undermining (cm) 4 (at 11:00-3:00)  Margins Unattached edges (unapproximated)  Drainage Amount Moderate  Drainage Description Odor - foul  Treatment Debridement (Selective);Hydrotherapy (Pulse lavage);Packing (Dry gauze) (medihoney)  Pressure Injury 01/07/22 Sacrum Medial Stage 3 -  Full thickness tissue loss. Subcutaneous fat may be visible but bone, tendon or muscle are NOT exposed. sacral wound with soft eschar-PT ONLY  Date First Assessed/Time First Assessed: 01/07/22 1300   Location: Sacrum  Location Orientation: Medial  Staging: Stage 3 -  Full thickness tissue loss. Subcutaneous fat may  be visible but bone, tendon or muscle are NOT exposed.  Wound Description (Co...  Dressing Type Honey;Foam - Lift dressing to assess site every shift;Gauze (Comment)  Dressing Clean, Dry, Intact  Dressing Change Frequency Twice a day  State of Healing Non-healing  Site / Wound Assessment Painful  % Wound base Red or Granulating 0%  % Wound base Yellow/Fibrinous Exudate 100%  % Wound base Black/Eschar 0%  Peri-wound Assessment Excoriated;Maceration  Wound Length (cm) 4 cm  Wound Width (cm) 4 cm  Wound Depth (cm) 0.1 cm  Wound Surface Area (cm^2) 16 cm^2  Wound Volume (cm^3) 1.6 cm^3  Drainage Amount Minimal  Drainage Description Odor - foul;Serous  Treatment Debridement (Selective);Hydrotherapy (Pulse lavage);Packing (Dry gauze)  Hydrotherapy  Pulsed Lavage with Suction (psi) 8 psi  Pulsed Lavage with Suction - Normal Saline Used 1000 mL  Pulsed Lavage Tip Tip with splash shield  Pulsed lavage therapy - wound location lt, ischium sacrum  Selective Debridement (non-excisional)  Selective Debridement (non-excisional) - Location sacrum, Lt. ischium  Selective Debridement (non-excisional) - Tools Used Forceps;Scissors  Selective Debridement (non-excisional) - Tissue Removed slough, eschar  Wound Therapy - Assess/Plan/Recommendations  Wound Therapy - Clinical Statement 61 YO male  with Cerebral palsy, non verbal, contractures of LE's, flailing arms. Patient's sister, who is primary caregiver, present for patient wound  history. She reports the wound on ischium present for ~ month, has worsened in last 2 weeks, wound on  sacrum present ~   2 weeks.Patient  requires total care, gets into  [facility] daily with sisters support/lift. Ischial wound open and deep, malodorous , sacral wound has  eschar.  Patient will benefit from  PLS and debridement, determined by PT on visit basis, to improve wound qualities, decrease nonviable tissue and decrease bioburden effects.. Sister plans for patient to return home  with [facility] services.PT recommends low air loss mattress for home. Patient  tolerated well with distractions by sister speaking to him.Surgery has no plans for debridement.  Wound Therapy - Functional Problem List spastic paralysis, total care, incontinent  Factors Delaying/Impairing Wound Healing Incontinence;Immobility  Hydrotherapy Plan Debridement;Dressing change;Patient/family education;Pulsatile lavage with suction  Wound Therapy - Frequency  (2 x week)  Wound Therapy - Current Recommendations Case manager/social work  Wound Therapy - Follow Up Recommendations f/u pulsed lavage with suction;f/u selective debridement;dressing changes by RN  Wound Therapy Goals - Improve the function of patient's integumentary system by progressing the wound(s) through the phases of wound healing by:  Decrease Necrotic Tissue to 50  Decrease Necrotic Tissue - Progress Goal set today  Increase Granulation Tissue to 50  Increase Granulation Tissue - Progress Goal set today  Patient/Family will be able to  change dressings  Patient/Family Instruction Goal - Progress Goal set today  Goals/treatment plan/discharge plan were made with and agreed upon by patient/family Yes  Time For Goal Achievement 2 weeks  Wound Therapy - Potential for Goals Fair  Blanchard Kelch PT Acute Rehabilitation Services Office [phone] Weekend [phone] Ev, deb < 20 cm2, gun and tip, dress x 2

## 2022-01-07 NOTE — Progress Notes (Addendum)
PHARMACY CONSULT NOTE FOR:  OUTPATIENT  PARENTERAL ANTIBIOTIC THERAPY (OPAT)  Indication: Sacral wound / osteomyelitis  Regimen: Daptomycin 400mg  IV daily plus ceftriaxone 2g IV daily plus flagyl 500mg  PO BID  End date for IV Antibiotics: 01/20/2022  After completing 2 weeks of IV daptomycin and ceftriaxone with oral Flagyl, transition to oral Augmentin and doxycycline for an additional 4 weeks to complete total course of 6 weeks.   IV antibiotic discharge orders are pended. To discharging provider:  please sign these orders via discharge navigator,  Select New Orders & click on the button choice - Manage This Unsigned Work.     Thank you for allowing pharmacy to be a part of this patient's care.  , PharmD  PGY1 Pharmacy Resident

## 2022-01-08 ENCOUNTER — Inpatient Hospital Stay (HOSPITAL_COMMUNITY): Payer: Medicare Other

## 2022-01-08 DIAGNOSIS — T148XXA Other injury of unspecified body region, initial encounter: Secondary | ICD-10-CM | POA: Diagnosis not present

## 2022-01-08 DIAGNOSIS — L089 Local infection of the skin and subcutaneous tissue, unspecified: Secondary | ICD-10-CM | POA: Diagnosis not present

## 2022-01-08 LAB — BASIC METABOLIC PANEL
Anion gap: 8 (ref 5–15)
BUN: 26 mg/dL — ABNORMAL HIGH (ref 6–20)
CO2: 24 mmol/L (ref 22–32)
Calcium: 8.4 mg/dL — ABNORMAL LOW (ref 8.9–10.3)
Chloride: 106 mmol/L (ref 98–111)
Creatinine, Ser: 0.86 mg/dL (ref 0.61–1.24)
GFR, Estimated: >60 mL/min (ref >60)
Glucose, Bld: 104 mg/dL — ABNORMAL HIGH (ref 70–99)
Potassium: 4.1 mmol/L (ref 3.5–5.1)
Sodium: 138 mmol/L (ref 135–145)

## 2022-01-08 LAB — CBC
HCT: 35.5 % — ABNORMAL LOW (ref 39.0–52.0)
Hemoglobin: 10.3 g/dL — ABNORMAL LOW (ref 13.0–17.0)
MCH: 26.6 pg (ref 26.0–34.0)
MCHC: 29 g/dL — ABNORMAL LOW (ref 30.0–36.0)
MCV: 91.7 fL (ref 80.0–100.0)
Platelets: 441 10*3/uL — ABNORMAL HIGH (ref 150–400)
RBC: 3.87 MIL/uL — ABNORMAL LOW (ref 4.22–5.81)
RDW: 12.9 % (ref 11.5–15.5)
WBC: 7.6 10*3/uL (ref 4.0–10.5)
nRBC: 0 % (ref 0.0–0.2)

## 2022-01-08 LAB — MISC LABCORP TEST (SEND OUT): Labcorp test code: 83935

## 2022-01-08 LAB — MAGNESIUM: Magnesium: 2.6 mg/dL — ABNORMAL HIGH (ref 1.7–2.4)

## 2022-01-08 LAB — CK: Total CK: 300 U/L (ref 49–397)

## 2022-01-08 MED ORDER — SORBITOL 70 % SOLN
960.0000 mL | TOPICAL_OIL | Freq: Once | ORAL | Status: AC
  Administered 2022-01-08: 960 mL via RECTAL
  Filled 2022-01-08: qty 240

## 2022-01-08 NOTE — TOC Progression Note (Signed)
Transition of Care [facility]) - Progression Note    Patient Details  Name: ADIB WAHBA MRN: [id number] Date of Birth: 10/08/1960  [facility]) CM/SW Contact  Adrian Prows, RN Phone Number: 01/08/2022, 5:16 PM  Clinical Narrative:    Pt not ready for d/c; hydrotherapy by PT; orders for [facility]; specific home wound care orders; plan for home IV abx Jeri Modena @ [facility] aware); plan PICC placement after BC - x 48 hrs; TOC will con't to follow.        Expected Discharge Plan and Services                                                 Social Determinants of Health (SDOH) Interventions    Readmission Risk Interventions     No data to display

## 2022-01-08 NOTE — Progress Notes (Signed)
[facility] Progress Note Patient: [name] OYD:[id number] DOB: 21-Jul-1960 DOA: 01/04/2022  DOS: the patient was seen and examined on 01/08/2022  Brief hospital course: 61 year old male with past medical history of cerebral palsy nonverbal at baseline, wheelchair-bound, right hemiparesis with left-sided contracture presented to the hospital with complaints of decreased urination.  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  Along with fecal impaction. There was some concerns for UTI and a Foley catheter was changed in the ED. General surgery consulted.  ID consulted as well.  Currently on IV antibiotics.  We will start hydrotherapy in the hospital. Assessment and Plan: Osteomyelitis of ischial tuberosity and coccyx. Unstageable pressure ulcers on sacrum and left ischial tuberosity.  Present on admission. Currently on IV vancomycin cefepime and Flagyl. Superficial wound culture positive for Proteus and E. coli.  No blood cultures performed. We will perform blood cultures on 11/15. General surgery consult.  Recommend hydrotherapy.  We will continue hydrotherapy in the hospital for a few sessions. ID consulted. Currently on IV antibiotics.  Outpatient patient will be on daptomycin and ceftriaxone. Dietitian. Once blood cultures negative for 48 hours will place a PICC line order.   UTI. Related to chronic indwelling Foley catheter. Follows up with urology outpatient. Foley catheter was changed in the ED on admission. Currently on antibiotics.  Urine culture grew multiple species. Urine still has some sediments.   History of cerebral palsy. Right hemiparesis. At risk for nonhealing wounds. Monitor.   Fecal impaction. Stercoral colitis. Continue bowel regimen. X-ray on 11/15 shows persistent rectal impaction.  We will provide another smog enema.   Subjective: No acute complaints.  No nausea no vomiting.  Abdomen somewhat distended.  Tolerating oral  diet still.  Physical Exam: General: in mild distress;  Cardiovascular: S1 and S2 Present, no Murmur Respiratory: normal respiratory effort, Bilateral Air entry present, no Crackles, no wheezes Abdomen: Bowel Sound present, Non tender distended Extremities: no edema Neurology: alert and non verbal   Data Reviewed: I have Reviewed nursing notes, Vitals, and Lab results. Since last encounter, pertinent lab results CBC and BMP   . I have ordered test including CBC, BMP, blood cultures  . I have independently visualized and interpreted imaging x-ray abdomen which showed persistent rectal impaction  .   Disposition: Status is: Inpatient Remains inpatient appropriate because: Need for hydrotherapy and wound care  enoxaparin (LOVENOX) injection 40 mg Start: 01/05/22 1000   Family Communication: Sister at bedside Level of care: Med-Surg  Vitals:   01/07/22 1127 01/07/22 2127 01/08/22 0610 01/08/22 1407  BP: 97/65 109/88 93/69 91/69   Pulse: 69 73 70 87  Resp: 18 17 16 16   Temp: 98.3 F (36.8 C) 99.1 F (37.3 C) 98.4 F (36.9 C) 98.5 F (36.9 C)  TempSrc: Oral Oral Oral Oral  SpO2:  96% 98% 96%  Weight:   51.3 kg   Height:   5\' 4"  (1.626 m)      Author: , MD 01/08/2022 3:32 PM  Please look on [email or web address] to find out who is on call.

## 2022-01-08 NOTE — Plan of Care (Signed)
?  Problem: Coping: ?Goal: Level of anxiety will decrease ?Outcome: Progressing ?  ?Problem: Safety: ?Goal: Ability to remain free from injury will improve ?Outcome: Progressing ?  ?Problem: Pain Managment: ?Goal: General experience of comfort will improve ?Outcome: Progressing ?  ?Problem: Elimination: ?Goal: Will not experience complications related to bowel motility ?Outcome: Progressing ?Goal: Will not experience complications related to urinary retention ?Outcome: Progressing ?  ?

## 2022-01-09 ENCOUNTER — Inpatient Hospital Stay (HOSPITAL_COMMUNITY): Payer: Medicare Other

## 2022-01-09 DIAGNOSIS — L089 Local infection of the skin and subcutaneous tissue, unspecified: Secondary | ICD-10-CM | POA: Diagnosis not present

## 2022-01-09 DIAGNOSIS — T148XXA Other injury of unspecified body region, initial encounter: Secondary | ICD-10-CM | POA: Diagnosis not present

## 2022-01-09 LAB — BASIC METABOLIC PANEL
Anion gap: 8 (ref 5–15)
BUN: 43 mg/dL — ABNORMAL HIGH (ref 6–20)
CO2: 23 mmol/L (ref 22–32)
Calcium: 8.8 mg/dL — ABNORMAL LOW (ref 8.9–10.3)
Chloride: 106 mmol/L (ref 98–111)
Creatinine, Ser: 1.01 mg/dL (ref 0.61–1.24)
GFR, Estimated: >60 mL/min (ref >60)
Glucose, Bld: 103 mg/dL — ABNORMAL HIGH (ref 70–99)
Potassium: 4 mmol/L (ref 3.5–5.1)
Sodium: 137 mmol/L (ref 135–145)

## 2022-01-09 LAB — MAGNESIUM: Magnesium: 2.6 mg/dL — ABNORMAL HIGH (ref 1.7–2.4)

## 2022-01-09 LAB — CBC
HCT: 37.6 % — ABNORMAL LOW (ref 39.0–52.0)
Hemoglobin: 11 g/dL — ABNORMAL LOW (ref 13.0–17.0)
MCH: 26.6 pg (ref 26.0–34.0)
MCHC: 29.3 g/dL — ABNORMAL LOW (ref 30.0–36.0)
MCV: 90.8 fL (ref 80.0–100.0)
Platelets: 426 10*3/uL — ABNORMAL HIGH (ref 150–400)
RBC: 4.14 MIL/uL — ABNORMAL LOW (ref 4.22–5.81)
RDW: 12.9 % (ref 11.5–15.5)
WBC: 8.2 10*3/uL (ref 4.0–10.5)
nRBC: 0 % (ref 0.0–0.2)

## 2022-01-09 MED ORDER — SIMETHICONE 80 MG PO CHEW
80.0000 mg | CHEWABLE_TABLET | Freq: Four times a day (QID) | ORAL | Status: DC
  Administered 2022-01-09 – 2022-01-13 (×18): 80 mg via ORAL
  Filled 2022-01-09 (×18): qty 1

## 2022-01-09 NOTE — Progress Notes (Signed)
[facility] Progress Note Patient: [name] JIR:[id number] DOB: 10/15/1960 DOA: 01/04/2022  DOS: the patient was seen and examined on 01/09/2022  Brief hospital course: 61 year old male with past medical history of cerebral palsy nonverbal at baseline, wheelchair-bound, right hemiparesis with left-sided contracture presented to the hospital with complaints of decreased urination.  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  Along with fecal impaction. There was some concerns for UTI and a Foley catheter was changed in the ED. General surgery consulted.  ID consulted as well.  Currently on IV antibiotics.  Continue hydrotherapy in the hospital. Assessment and Plan: Osteomyelitis of ischial tuberosity and coccyx. Unstageable pressure ulcers on sacrum and left ischial tuberosity.  Present on admission. Was on IV vancomycin cefepime and Flagyl. Superficial wound culture positive for Proteus and E. coli.  No blood cultures performed. Blood cultures performed on 11/15.  So far negative.  PICC line tomorrow if remains negative. General surgery consult.  Recommend hydrotherapy.  We will continue hydrotherapy in the hospital for a few sessions. ID consulted. Outpatient patient will be on daptomycin and ceftriaxone. Once blood cultures negative for 48 hours will place a PICC line order.  UTI. Related to chronic indwelling Foley catheter. Follows up with urology outpatient. Foley catheter was changed in the ED on admission. Currently on antibiotics.  Urine culture grew multiple species. Urine still has some sediments.   History of cerebral palsy. Right hemiparesis. At risk for nonhealing wounds. Monitor.   Fecal impaction. Stercoral colitis. Continue bowel regimen. X-ray on 11/15 shows persistent rectal impaction.  We will provide another smog enema.  Elevated BUN. Mild elevation of serum creatinine. Recommend oral hydration.  We will recheck tomorrow.    Subjective: No nausea or vomiting.  Continues to have no bowel movement.  No fever no chills.  Abdomen is less distended.  Physical Exam: General: in no distress;  Cardiovascular: S1 and S2 Present, no Murmur Respiratory: good respiratory effort, Bilateral Air entry present, no Crackles, no wheezes Abdomen: Bowel Sound present, Non tender  Extremities: no edema Neurology: alert and non verbal   Data Reviewed: I have Reviewed nursing notes, Vitals, and Lab results. Since last encounter, pertinent lab results CBC and BMP   . I have ordered test including CBC and BMP  .  I have discussed the case with general surgery.  Disposition: Status is: Inpatient Remains inpatient appropriate because: Continue with IV antibiotics and hydrotherapy.  enoxaparin (LOVENOX) injection 40 mg Start: 01/05/22 1000   Family Communication: Sister at bedside. Level of care: Med-Surg  Vitals:   01/08/22 1407 01/08/22 2121 01/09/22 0436 01/09/22 1348  BP: 91/69 99/72 98/75  95/74  Pulse: 87 88 86 71  Resp: 16 16 16 17   Temp: 98.5 F (36.9 C) 98 F (36.7 C) 97.6 F (36.4 C) 98.3 F (36.8 C)  TempSrc: Oral Oral Oral Oral  SpO2: 96% 98% 97% 93%  Weight:      Height:         Author: , MD 01/09/2022 5:52 PM  Please look on [email or web address] to find out who is on call.

## 2022-01-10 ENCOUNTER — Inpatient Hospital Stay: Payer: Self-pay

## 2022-01-10 DIAGNOSIS — L089 Local infection of the skin and subcutaneous tissue, unspecified: Secondary | ICD-10-CM | POA: Diagnosis not present

## 2022-01-10 DIAGNOSIS — T148XXA Other injury of unspecified body region, initial encounter: Secondary | ICD-10-CM | POA: Diagnosis not present

## 2022-01-10 MED ORDER — ENSURE MAX PROTEIN PO LIQD: 11.0000 [oz_av] | Freq: Every day | ORAL | 0 refills | Status: AC

## 2022-01-10 MED ORDER — DAPTOMYCIN IV (FOR PTA / DISCHARGE USE ONLY): 400.0000 mg | INTRAVENOUS | 0 refills | Status: AC

## 2022-01-10 MED ORDER — POLYETHYLENE GLYCOL 3350 17 G PO PACK: 17.0000 g | PACK | Freq: Every day | ORAL | 0 refills | Status: AC

## 2022-01-10 MED ORDER — MAGNESIUM HYDROXIDE 400 MG/5ML PO SUSP: 15.0000 mL | Freq: Every day | ORAL | 0 refills | Status: DC

## 2022-01-10 MED ORDER — DOCUSATE SODIUM 100 MG PO CAPS: 100.0000 mg | ORAL_CAPSULE | Freq: Two times a day (BID) | ORAL | 2 refills | Status: AC

## 2022-01-10 MED ORDER — ADULT MULTIVITAMIN W/MINERALS CH: 1.0000 | ORAL_TABLET | Freq: Every day | ORAL | 0 refills | Status: AC

## 2022-01-10 MED ORDER — METRONIDAZOLE 500 MG PO TABS: 500.0000 mg | ORAL_TABLET | Freq: Two times a day (BID) | ORAL | 0 refills | Status: AC

## 2022-01-10 MED ORDER — CEFTRIAXONE IV (FOR PTA / DISCHARGE USE ONLY): 2.0000 g | INTRAVENOUS | 0 refills | Status: AC

## 2022-01-10 MED ORDER — DOXYCYCLINE HYCLATE 100 MG PO CAPS: 100.0000 mg | ORAL_CAPSULE | Freq: Two times a day (BID) | ORAL | 0 refills | Status: AC

## 2022-01-10 MED ORDER — AMOXICILLIN-POT CLAVULANATE 875-125 MG PO TABS: 1.0000 | ORAL_TABLET | Freq: Two times a day (BID) | ORAL | 0 refills | Status: AC

## 2022-01-10 MED ORDER — BISACODYL 10 MG RE SUPP: 10.0000 mg | Freq: Every day | RECTAL | 0 refills | Status: DC

## 2022-01-10 MED ORDER — JUVEN PO PACK: 1.0000 | PACK | Freq: Two times a day (BID) | ORAL | 0 refills | Status: AC

## 2022-01-10 MED ORDER — SIMETHICONE 80 MG PO CHEW: 80.0000 mg | CHEWABLE_TABLET | Freq: Four times a day (QID) | ORAL | 0 refills | Status: AC

## 2022-01-10 MED ORDER — MEDIHONEY WOUND/BURN DRESSING EX PSTE: 1.0000 | PASTE | Freq: Every day | CUTANEOUS | 0 refills | Status: AC

## 2022-01-10 NOTE — Plan of Care (Signed)
  Problem: Coping: Goal: Level of anxiety will decrease Outcome: Progressing   Problem: Pain Managment: Goal: General experience of comfort will improve Outcome: Progressing   Problem: Safety: Goal: Ability to remain free from injury will improve Outcome: Progressing   

## 2022-01-10 NOTE — Progress Notes (Signed)
[facility] Progress Note     Subjective: CC-  Seen with PT during hydrotherapy.  Objective: Vital signs in last 24 hours: Temp:  [97.7 F (36.5 C)-98.3 F (36.8 C)] 97.7 F (36.5 C) (11/17 0557) Pulse Rate:  [71-76] 76 (11/17 0557) Resp:  [16-18] 18 (11/17 0557) BP: (95-119)/(63-81) 119/81 (11/17 0557) SpO2:  [93 %-97 %] 97 % (11/17 0557) Last BM Date : 01/09/22  Intake/Output from previous day: 11/16 0701 - 11/17 0700 In: 1506 [P.O.:1320; I.V.:70; IV Piggyback:116] Out: 2200 [Urine:2200] Intake/Output this shift: No intake/output data recorded.  PE: Gen:  Alert, NAD Msk: BUE/BLE contractures GU:  -Sacral wound: fibrinous tissue decreasing and more healthy granulation tissue coming in. No cellulitis or purulent drainage   -Left ischial wound: also with decreasing fibrinous exudate and more healthy granulation tissue, small amount of necrotic tissue at distal edge. PT currently debriding this. No purulent drainage or cellulitis     Lab Results:  Recent Labs    01/08/22 0403 01/09/22 0419  WBC 7.6 8.2  HGB 10.3* 11.0*  HCT 35.5* 37.6*  PLT 441* 426*   BMET Recent Labs    01/08/22 0403 01/09/22 0419  NA 138 137  K 4.1 4.0  CL 106 106  CO2 24 23  GLUCOSE 104* 103*  BUN 26* 43*  CREATININE 0.86 1.01  CALCIUM 8.4* 8.8*   PT/INR No results for input(s): "LABPROT", "INR" in the last 72 hours. CMP     Component Value Date/Time   NA 137 01/09/2022 0419   K 4.0 01/09/2022 0419   CL 106 01/09/2022 0419   CO2 23 01/09/2022 0419   GLUCOSE 103 (H) 01/09/2022 0419   BUN 43 (H) 01/09/2022 0419   CREATININE 1.01 01/09/2022 0419   CALCIUM 8.8 (L) 01/09/2022 0419   PROT 6.6 08/22/2020 1804   ALBUMIN 2.5 (L) 01/05/2022 1157   AST 19 08/22/2020 1804   ALT 23 08/22/2020 1804   ALKPHOS 86 08/22/2020 1804   BILITOT 0.3 08/22/2020 1804   GFRNONAA >60 01/09/2022 0419   GFRAA >60 02/25/2019 2136   Lipase  No results found for:  "LIPASE"     Studies/Results: [place] EKG SITE RITE  Result Date: 01/10/2022 If Site Rite image not attached, placement could not be confirmed due to current cardiac rhythm.   Anti-infectives: Anti-infectives (From admission, onward)    Start     Dose/Rate Route Frequency Ordered Stop   01/07/22 2200  metroNIDAZOLE (FLAGYL) tablet 500 mg        500 mg Oral Every 12 hours 01/07/22 1129     01/07/22 1600  DAPTOmycin (CUBICIN) 400 mg in sodium chloride 0.9 % IVPB        8 mg/kg  51.3 kg 116 mL/hr over 30 Minutes Intravenous Daily 01/07/22 1013     01/07/22 1600  cefTRIAXone (ROCEPHIN) 2 g in sodium chloride 0.9 % 100 mL IVPB        2 g 200 mL/hr over 30 Minutes Intravenous Every 24 hours 01/07/22 1013     01/07/22 0830  metroNIDAZOLE (FLAGYL) IVPB 500 mg  Status:  Discontinued        500 mg 100 mL/hr over 60 Minutes Intravenous Every 12 hours 01/07/22 0733 01/07/22 1129   01/05/22 1630  ceFEPIme (MAXIPIME) 2 g in sodium chloride 0.9 % 100 mL IVPB  Status:  Discontinued        2 g 200 mL/hr over 30 Minutes Intravenous Every 8 hours 01/05/22 1531 01/07/22 1013  01/05/22 1630  vancomycin (VANCOREADY) IVPB 750 mg/150 mL  Status:  Discontinued        750 mg 150 mL/hr over 60 Minutes Intravenous Every 12 hours 01/05/22 1531 01/07/22 1013   01/05/22 0600  cefTRIAXone (ROCEPHIN) 1 g in sodium chloride 0.9 % 100 mL IVPB  Status:  Discontinued        1 g 200 mL/hr over 30 Minutes Intravenous Every 24 hours 01/04/22 2355 01/05/22 1525   01/04/22 2100  vancomycin (VANCOCIN) IVPB 1000 mg/200 mL premix        1,000 mg 200 mL/hr over 60 Minutes Intravenous  Once 01/04/22 2058 01/04/22 2306   01/04/22 2100  ceFEPIme (MAXIPIME) 2 g in sodium chloride 0.9 % 100 mL IVPB        2 g 200 mL/hr over 30 Minutes Intravenous  Once 01/04/22 2058 01/04/22 2203        Assessment/Plan Sacral and ischial decubitus ulcers with underlying osteomyelitis - Cleaning up well with hydrotherapy and local wound  care. No indication for surgical debridement. Continue wound care as per WOC with BID wet to dry dressing changes with medi-honey. Follow up with wound center. Antibiotics for osteomyelitis per ID. We will sign off, please call with questions or concerns.     FEN - Reg VTE - SCD's, lovenox ID - currently rocephin, daptomycin, flagyl Admit - TRH serivce   Cerebral palsy, nonverbal, nonambulatory  UTI    I reviewed hospitalist notes, last 24 h vitals and pain scores, last 48 h intake and output, last 24 h labs and trends, and last 24 h imaging results.    LOS: 4 days    Franne Forts, [facility] 01/10/2022, 10:21 AM Please see Amion for pager number during day hours 7:00am-4:30pm

## 2022-01-10 NOTE — Progress Notes (Signed)
[facility] Progress Note Patient: [name] IEP:[id number] DOB: Mar 25, 1960 DOA: 01/04/2022  DOS: the patient was seen and examined on 01/10/2022  Brief hospital course: 61 year old male with past medical history of cerebral palsy nonverbal at baseline, wheelchair-bound, right hemiparesis with left-sided contracture presented to the hospital with complaints of decreased urination.  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  Along with fecal impaction. There was some concerns for UTI and a Foley catheter was changed in the ED. General surgery consulted.  ID consulted as well.  Currently on IV antibiotics.  Continue hydrotherapy in the hospital. Assessment and Plan: Osteomyelitis of ischial tuberosity and coccyx. Unstageable pressure ulcers on sacrum and left ischial tuberosity.  Present on admission. Was on IV vancomycin cefepime and Flagyl. Superficial wound culture positive for Proteus and E. coli.  No blood cultures performed. Blood cultures performed on 11/15.  So far negative.  PICC line tomorrow if remains negative. General surgery consult.  Recommend hydrotherapy.  Tolerated well with improvement in wound appearance. ID consulted. Outpatient patient will be on daptomycin and ceftriaxone. PICC line order on 11/17.  Outpatient referral to wound care Order placed.  General surgery signed off.  TOC will be notified by the charge RN.   UTI. Related to chronic indwelling Foley catheter. Follows up with urology outpatient. Foley catheter was changed in the ED on admission. Currently on antibiotics.  Urine culture grew multiple species.   History of cerebral palsy. Right hemiparesis. At risk for nonhealing wounds. Monitor.   Fecal impaction. Stercoral colitis. Continue bowel regimen.   Elevated BUN. Mild elevation of serum creatinine. Recommend oral hydration.     Subjective: No nausea no vomiting no fever no chills. Oral intake  adequate.  Physical Exam: General: in mild distress;  Cardiovascular: S1 and S2 Present, no Murmur Respiratory: normal respiratory effort, Bilateral Air entry present, no Crackles, no wheezes Abdomen: Bowel Sound present, Non tender  Extremities: no edema Neurology: alert and non verbal   Data Reviewed: I have Reviewed nursing notes, Vitals, and Lab results. Since last encounter, pertinent lab results     . I have ordered test including CBC and BMP  .   Disposition: Status is: Inpatient Remains inpatient appropriate because: Need for arrangement for home with home health.  enoxaparin (LOVENOX) injection 40 mg Start: 01/05/22 1000   Family Communication: Sister at bedside. Level of care: Med-Surg  Vitals:   01/09/22 1348 01/09/22 2058 01/10/22 0557 01/10/22 1312  BP: 95/74 105/63 119/81 102/64  Pulse: 71 76 76 84  Resp: 17 16 18 18   Temp: 98.3 F (36.8 C) 98.3 F (36.8 C) 97.7 F (36.5 C) 98.3 F (36.8 C)  TempSrc: Oral  Oral Oral  SpO2: 93% 97% 97% 98%  Weight:      Height:         Author: , MD 01/10/2022 5:56 PM  Please look on [email or web address] to find out who is on call.

## 2022-01-10 NOTE — Progress Notes (Signed)
Physical Therapy Wound Treatment Patient Details  Name: [name] MRN: [id number] Date of Birth: Mar 27, 1960  Today's Date: 01/10/2022 Time: 5537-4827 Time Calculation (min): 60 min  Subjective  Subjective Assessment Subjective: pt nonverbal, sister present for information Patient and Family Stated Goals: for wounds to heal Date of Onset:  (present on admit) Prior Treatments: family dressings changed  Pain Score:    Wound Assessment  Pressure Injury 08/22/20 Hip Right Stage 3 -  Full thickness tissue loss. Subcutaneous fat may be visible but bone, tendon or muscle are NOT exposed. (Active)     Pressure Injury 01/07/22 Sacrum Left;Medial Stage 4 - Full thickness tissue loss with exposed bone, tendon or muscle. left ischial and sacral  wounds, malodorous with slough-PT ONLY (Active)  Wound Image   01/10/22 0951  Dressing Type Foam - Lift dressing to assess site every shift 01/10/22 1108  Dressing Clean, Dry, Intact 01/10/22 1108  Dressing Change Frequency Daily 01/10/22 1108  State of Healing Non-healing 01/10/22 0951  Site / Wound Assessment Brown;Painful;Yellow 01/10/22 0951  % Wound base Red or Granulating 0% 01/09/22 1600  % Wound base Yellow/Fibrinous Exudate 55% 01/07/22 1500  % Wound base Black/Eschar 45% 01/07/22 1500  Peri-wound Assessment Excoriated 01/10/22 0951  Wound Length (cm) 8 cm 01/07/22 1500  Wound Width (cm) 5 cm 01/07/22 1500  Wound Depth (cm) 3 cm 01/07/22 1500  Wound Surface Area (cm^2) 40 cm^2 01/07/22 1500  Wound Volume (cm^3) 120 cm^3 01/07/22 1500  Undermining (cm) 4 01/09/22 1600  Margins Unattached edges (unapproximated) 01/10/22 1108  Drainage Amount None 01/10/22 1108  Drainage Description Serosanguineous;Odor - foul 01/10/22 0951  Treatment Debridement (Selective) 01/10/22 1108     Pressure Injury 01/07/22 Sacrum Medial Stage 3 -  Full thickness tissue loss. Subcutaneous fat may be visible but bone, tendon or muscle are NOT exposed. sacral  wound with soft eschar-PT ONLY (Active)  Wound Image   01/10/22 0951  Dressing Type Foam - Lift dressing to assess site every shift 01/10/22 1108  Dressing Clean, Dry, Intact 01/10/22 1108  Dressing Change Frequency Daily 01/10/22 1108  State of Healing Non-healing 01/10/22 0951  Site / Wound Assessment Painful 01/10/22 0951  % Wound base Red or Granulating 0% 01/07/22 1500  % Wound base Yellow/Fibrinous Exudate 100% 01/07/22 1500  % Wound base Black/Eschar 0% 01/07/22 1500  Peri-wound Assessment Excoriated;Maceration 01/10/22 0951  Wound Length (cm) 4 cm 01/07/22 1500  Wound Width (cm) 4 cm 01/07/22 1500  Wound Depth (cm) 0.1 cm 01/07/22 1500  Wound Surface Area (cm^2) 16 cm^2 01/07/22 1500  Wound Volume (cm^3) 1.6 cm^3 01/07/22 1500  Drainage Amount None 01/10/22 1108  Drainage Description Serosanguineous 01/10/22 0951  Treatment Cleansed;Debridement (Selective);Hydrotherapy (Pulse lavage);Packing (Dry gauze);Other (Comment) 01/10/22 0951   Hydrotherapy Pulsed lavage therapy - wound location: Lt ischium and sacrum Pulsed Lavage with Suction (psi): 8 psi Pulsed Lavage with Suction - Normal Saline Used: 500 mL Pulsed Lavage Tip: Tip with splash shield Selective Debridement (non-excisional) Selective Debridement (non-excisional) - Location: sacrum, Lt. ischium Selective Debridement (non-excisional) - Tools Used: Forceps, Scissors, Scalpel Selective Debridement (non-excisional) - Tissue Removed: slough, eschar    Wound Assessment and Plan  Wound Therapy - Assess/Plan/Recommendations Wound Therapy - Clinical Statement: Patient see for hydrotherapy visit #2 and wounds appear to be improving with slough and eschar easily debridement from ischial wound and good tolerance to selective sharp debridement. Pulsed lavage continued to loosen slough in deep portion of ischial wound and at sacral wound as it  is more adheared to wound bed. Periwound intact and continued with medihoney and gauze  packing. Pt will continue to benefit from skilled hydrotherapy by PT to improve wound qualities, decrease nonviable tissue and decrease bioburden effects. Sister plans for patient to return home with [facility] services. PT recommends low air loss mattress for home. Discussed current wheelcahir cusion which is foam only and recommend pt's sister contact wheelchair supplier to alert of new woudn and skin integrity issues to request updated wheelchair air cushion. Wound Therapy - Functional Problem List: spastic paralysis, total care, incontinent Factors Delaying/Impairing Wound Healing: Incontinence, Immobility Hydrotherapy Plan: Debridement, Dressing change, Patient/family education, Pulsatile lavage with suction Wound Therapy - Frequency:  (2 x week) Wound Therapy - Current Recommendations: Case manager/social work Wound Therapy - Follow Up Recommendations: f/u pulsed lavage with suction, f/u selective debridement, dressing changes by RN, dressing changes by family/patient (pt needs new cushion for wc ([place]oho air cushion) and air replacement/overaly for hospital bed at home)  Wound Therapy Goals- Improve the function of patient's integumentary system by progressing the wound(s) through the phases of wound healing (inflammation - proliferation - remodeling) by: Wound Therapy Goals - Improve the function of patient's integumentary system by progressing the wound(s) through the phases of wound healing by: Decrease Necrotic Tissue to: 50 Decrease Necrotic Tissue - Progress: Progressing toward goal Increase Granulation Tissue to: 50 Increase Granulation Tissue - Progress: Progressing toward goal Patient/Family will be able to : change dressings Patient/Family Instruction Goal - Progress: Progressing toward goal Goals/treatment plan/discharge plan were made with and agreed upon by patient/family: Yes Time For Goal Achievement: 2 weeks Wound Therapy - Potential for Goals: Fair  Goals will be updated until  maximal potential achieved or discharge criteria met.  Discharge criteria: when goals achieved, discharge from hospital, MD decision/surgical intervention, no progress towards goals, refusal/missing three consecutive treatments without notification or medical reason.  GP     Charges PT Wound Care Charges $Wound Debridement up to 20 cm: < or equal to 20 cm $ Wound Debridement each add'l 20 sqcm: 1 $PT Hydrotherapy Dressing: 2 dressings $PT PLS Gun and Tip: 1 Supply $PT Hydrotherapy Visit: 2 Visits       Verner Mould, DPT Acute Rehabilitation Services Office [phone]  01/10/22 1:31 PM

## 2022-01-10 NOTE — Care Management Important Message (Signed)
Important Message  Patient Details IM Letter given. Name: RENE SIZELOVE MRN: [id number] Date of Birth: Jan 15, 1961   Medicare Important Message Given:  Yes     Caren Macadam 01/10/2022, 10:57 AM

## 2022-01-11 DIAGNOSIS — L089 Local infection of the skin and subcutaneous tissue, unspecified: Secondary | ICD-10-CM | POA: Diagnosis not present

## 2022-01-11 DIAGNOSIS — T148XXA Other injury of unspecified body region, initial encounter: Secondary | ICD-10-CM | POA: Diagnosis not present

## 2022-01-11 LAB — AEROBIC/ANAEROBIC CULTURE W GRAM STAIN (SURGICAL/DEEP WOUND)

## 2022-01-11 MED ORDER — SODIUM CHLORIDE 0.9% FLUSH
10.0000 mL | Freq: Two times a day (BID) | INTRAVENOUS | Status: DC
  Administered 2022-01-13: 10 mL

## 2022-01-11 MED ORDER — CHLORHEXIDINE GLUCONATE CLOTH 2 % EX PADS
6.0000 | MEDICATED_PAD | Freq: Every day | CUTANEOUS | Status: DC
  Administered 2022-01-11 – 2022-01-13 (×3): 6 via TOPICAL

## 2022-01-11 MED ORDER — SODIUM CHLORIDE 0.9% FLUSH
10.0000 mL | INTRAVENOUS | Status: DC | PRN
  Administered 2022-01-13: 10 mL

## 2022-01-11 NOTE — Progress Notes (Signed)
[facility] PROGRESS NOTE  Patient: [name] [id number]:[id number]   PCP: Aliene Beams, MD DOB: January 24, 1961   DOA: 01/04/2022   DOS: 01/11/2022    Subjective: No acute complaint.  No nausea no vomiting no fever no chills.  Objective:   Vitals:   01/10/22 1312 01/10/22 2327 01/11/22 0549 01/11/22 1300  BP: 102/64 98/70 (!) 90/52 107/67  Pulse: 84 86 80   Resp: 18 16 16    Temp: 98.3 F (36.8 C) (!) 97.5 F (36.4 C) (!) 97.4 F (36.3 C) 97.8 F (36.6 C)  TempSrc: Oral Oral Oral Oral  SpO2: 98% 97% 99%   Weight:      Height:        S1-S2 present Bowel sound present. Nondistended. No edema in the lower extremity.  Assessment and plan: Active Issues Osteomyelitis of ischial tuberosity and coccyx. PICC line inserted on 11/18. Appreciate TOC's assistance for attempting to arrange home health. Once home health is arranged for IV antibiotics, patient will be stable for discharge. Continue dressing changes as documented.  Family wants to transfer the patient themself which is okay.  Brief Hospital course 61 year old male with past medical history of cerebral palsy nonverbal at baseline, wheelchair-bound, right hemiparesis with left-sided contracture presented to the hospital with complaints of decreased urination.  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  Along with fecal impaction. There was some concerns for UTI and a Foley catheter was changed in the ED. General surgery consulted.  ID consulted as well.  Currently on IV antibiotics.  Continue hydrotherapy in the hospital.   Principal Problem:   Wound infection Active Problems:   Pressure ulcer   UTI (urinary tract infection)   Cerebral palsy (HCC)   Osteomyelitis (HCC)    Author: [id number], MD [facility] 01/11/2022 4:01 PM   If 7PM-7AM, please contact night-coverage at [email or web address]

## 2022-01-11 NOTE — Progress Notes (Signed)
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 left ischial , malodorous with slough-PT ONLY  Date First Assessed/Time First Assessed: 01/07/22 1130   Location:  Location Orientation: Left; Staging: Stage 4 - Full thickness tissue loss with exposed bone, tendon or muscle.  Wound Description (Comments): left ischiall  wo...  Wound Image    Dressing Type Gauze (Comment);Foam - Lift dressing to assess site every shift;Honey  Dressing Clean, Dry, Intact  Dressing Change Frequency Twice a day  State of Healing Non-healing  Site / Wound Assessment Brown;Painful;Yellow  % Wound base Yellow/Fibrinous Exudate 55%  % Wound base Black/Eschar 45%  Peri-wound Assessment Excoriated  Wound Length (cm) 8 cm  Wound Width (cm) 5 cm  Wound Depth (cm) 3 cm  Wound Surface Area (cm^2) 40 cm^2  Wound Volume (cm^3) 120 cm^3  Undermining (cm) 4  Margins Unattached edges (unapproximated)  Drainage Amount Moderate  Drainage Description Odor - foul  Treatment Debridement (Selective);Hydrotherapy (Pulse lavage);Packing (Dry gauze) (medihoney)  Pressure Injury 01/07/22 Sacrum Medial Stage 3 -  Full thickness tissue loss. Subcutaneous fat may be visible but bone, tendon or muscle are NOT exposed. sacral wound with soft eschar-PT ONLY  Date First Assessed/Time First Assessed: 01/07/22 1300   Location: Sacrum  Location Orientation: Medial  Staging: Stage 3 -  Full thickness tissue loss. Subcutaneous fat may be visible but bone, tendon or muscle are NOT exposed.  Wound  Description (Co...  Dressing Type Honey;Foam - Lift dressing to assess site every shift;Gauze (Comment)  Dressing Clean, Dry, Intact  Dressing Change Frequency Twice a day  State of Healing Non-healing  Site / Wound Assessment Painful  % Wound base Red or Granulating 0%  Peri-wound Assessment Excoriated;Maceration  Wound Length (cm) 4 cm  Wound Width (cm) 4 cm  Wound Depth (cm) 0.2 cm  Wound Surface Area (cm^2) 16 cm^2  Wound Volume (cm^3) 3.2 cm^3  Drainage Amount None  Drainage Description Serosanguineous  Treatment Cleansed;Debridement (Selective);Hydrotherapy (Pulse lavage);Packing (Dry gauze)  Hydrotherapy  Pulsed Lavage with Suction (psi) 8 psi  Pulsed Lavage with Suction - Normal Saline Used 500 mL  Pulsed Lavage Tip Tip with splash shield  Pulsed lavage therapy - wound location Lt ischium and sacrum  Selective Debridement (non-excisional)  Selective Debridement (non-excisional) - Location sacrum, Lt. ischium  Selective Debridement (non-excisional) - Tools Used Forceps;Scissors;Scalpel  Selective Debridement (non-excisional) - Tissue Removed slough, eschar  Wound Therapy - Assess/Plan/Recommendations  Wound Therapy - Clinical Statement Patient see for hydrotherapy visit #3 as plans are for DC soon. Sister present, aware of dressings to be done at DC. Wounds appear to be improving . Patient indicating painful with PLS to ischium today so  limited time spent .  Pulsed lavage continued to loosen slough in deep portion of ischial wound and at sacral wound as it is more adheared to wound bed. Periwound intact and continued with medihoney and gauze packing. Pt will continue to benefit from skilled hydrotherapy by PT to improve wound qualities, decrease nonviable tissue and decrease bioburden effects. Sister plans for patient to return home with  HHRN services. PT recommends low air loss mattress for home. Discussed current wheelcahir cusion which is foam only and recommend pt's sister  contact wheelchair supplier to alert of new wound and skin integrity issues to request updated wheelchair air cushion.  Wound Therapy - Functional Problem List spastic paralysis, total care, incontinent  Factors Delaying/Impairing Wound Healing Incontinence;Immobility  Hydrotherapy Plan Debridement;Dressing change;Patient/family education;Pulsatile lavage with suction  Wound Therapy - Frequency  (2 x week)  Wound Therapy - Current Recommendations Case manager/social work  Wound Therapy - Follow Up Recommendations f/u pulsed lavage with suction;f/u selective debridement;dressing changes by RN;dressing changes by family/patient (pt needs new cushion for wc (Roho air cushion) and air replacement/overaly for hospital bed at home)  Wound Therapy Goals - Improve the function of patient's integumentary system by progressing the wound(s) through the phases of wound healing by:  Decrease Necrotic Tissue to 50  Decrease Necrotic Tissue - Progress Progressing toward goal  Increase Granulation Tissue to 50  Increase Granulation Tissue - Progress Progressing toward goal  Patient/Family will be able to  change dressings  Patient/Family Instruction Goal - Progress Progressing toward goal  Goals/treatment plan/discharge plan were made with and agreed upon by patient/family Yes  Time For Goal Achievement 2 weeks  Wound Therapy - Potential for Goals Fair  Blanchard Kelch PT Acute Rehabilitation Services Office [phone] Weekend pager-[phone] Deb <20 cm2, dr x 1, gun and tip

## 2022-01-11 NOTE — Progress Notes (Signed)
Peripherally Inserted Central Catheter Placement  The IV Nurse has discussed with the patient and/or persons authorized to consent for the patient, the purpose of this procedure and the potential benefits and risks involved with this procedure.  The benefits include less needle sticks, lab draws from the catheter, and the patient may be discharged home with the catheter. Risks include, but not limited to, infection, bleeding, blood clot (thrombus formation), and puncture of an artery; nerve damage and irregular heartbeat and possibility to perform a PICC exchange if needed/ordered by physician.  Alternatives to this procedure were also discussed.  Bard Power PICC patient education guide, fact sheet on infection prevention and patient information card has been provided to patient /or left at bedside.  Parents signed consent.  PICC Placement Documentation  PICC Single Lumen 01/11/22 Brachial 34 cm 0 cm (Active)  Indication for Insertion or Continuance of Line Home intravenous therapies (PICC only) 01/11/22 1313  Exposed Catheter (cm) 0 cm 01/11/22 1313  Site Assessment Clean, Dry, Intact 01/11/22 1313  Line Status Flushed;Saline locked;Blood return noted 01/11/22 1313  Dressing Type Transparent;Securing device 01/11/22 1313  Dressing Status Antimicrobial disc in place;Clean, Dry, Intact 01/11/22 1313  Safety Lock Not Applicable 01/11/22 1313  Line Care Connections checked and tightened 01/11/22 1313  Line Adjustment (NICU/IV Team Only) No 01/11/22 1313  Dressing Intervention New dressing 01/11/22 1313  Dressing Change Due 01/18/22 01/11/22 1313       Elliot Dally 01/11/2022, 1:14 PM

## 2022-01-11 NOTE — TOC Progression Note (Signed)
Transition of Care [facility]) - Progression Note    Patient Details  Name: [name] MRN: [id number] Date of Birth: September 08, 1960  [facility]) CM/SW Contact  Amada Jupiter, LCSW Phone Number: 01/11/2022, 3:51 PM  Clinical Narrative:     Have spoken with MD and pt's sister today to begin securing dc referrals/ arrangements with anticipation pt may be ready for dc tomorrow.  Sister aware and agreeable with plans for Home abx management and need for [facility]  - no agency preferences.  Referral placed with [employer] Jeri Modena, RN) who will arrange for abx supplies and teaching with sister on administration.  HHRN secured with [facility].  Have asked covering [facility] team for tomorrow to follow up with MD and agencies to determine if all arrangements are ready to go and allow for pt to dc home tomorrow if medically cleared.  Sister confirms that family will provide dc transportation.       Expected Discharge Plan and Services                                     HH Arranged: IV Antibiotics, RN HH Agency: [facility], [facility] Date [facility] Agency Contacted: 01/11/22 Time HH Agency Contacted: 1550 Representative spoke with at [facility] Agency: [employer] - Jeri Modena, RN;   [facility] - Lorenza Chick   Social Determinants of Health (SDOH) Interventions    Readmission Risk Interventions     No data to display

## 2022-01-12 DIAGNOSIS — L089 Local infection of the skin and subcutaneous tissue, unspecified: Secondary | ICD-10-CM | POA: Diagnosis not present

## 2022-01-12 DIAGNOSIS — T148XXA Other injury of unspecified body region, initial encounter: Secondary | ICD-10-CM | POA: Diagnosis not present

## 2022-01-12 LAB — BASIC METABOLIC PANEL
Anion gap: 7 (ref 5–15)
BUN: 46 mg/dL — ABNORMAL HIGH (ref 6–20)
CO2: 28 mmol/L (ref 22–32)
Calcium: 8.8 mg/dL — ABNORMAL LOW (ref 8.9–10.3)
Chloride: 102 mmol/L (ref 98–111)
Creatinine, Ser: 0.89 mg/dL (ref 0.61–1.24)
GFR, Estimated: >60 mL/min (ref >60)
Glucose, Bld: 99 mg/dL (ref 70–99)
Potassium: 4.3 mmol/L (ref 3.5–5.1)
Sodium: 137 mmol/L (ref 135–145)

## 2022-01-12 LAB — MAGNESIUM: Magnesium: 2.6 mg/dL — ABNORMAL HIGH (ref 1.7–2.4)

## 2022-01-12 NOTE — TOC Progression Note (Addendum)
Transition of Care [facility]) - Progression Note    Patient Details  Name: [name] MRN: [id number] Date of Birth: 1960/09/12  Transition of Care [facility]) CM/SW Contact  Princella Ion, LCSW Phone Number: 01/12/2022, 12:35 PM  Clinical Narrative:    This CSW followed up with Pam @ [employer] to inquire about being available to assist if pt is d/c today. Pam reports she, Denyse Amass w/[facility], and the pt's sister were expecting the pt to d/c tomorrow (Monday, 11.20). Pam states she shared this information on 11/18 and apologized for any misunderstanding. Denyse Amass did confirm that [facility] is ready to assist. Per Pam, she and Denyse Amass have spoken and [facility] will begin on Tuesday. Pam states that the pt's sister will be coming tomorrow and that they have to educate and train her prior to the pt's d/c. This CSW informed Dr. Allena Katz via secure chat.         Expected Discharge Plan and Services                                     HH Arranged: IV Antibiotics, RN HH Agency: [facility], [facility] Date [facility] Agency Contacted: 01/11/22 Time HH Agency Contacted: 1550 Representative spoke with at [facility] Agency: [employer] - Jeri Modena, RN;   [facility] - Lorenza Chick   Social Determinants of Health (SDOH) Interventions    Readmission Risk Interventions     No data to display

## 2022-01-12 NOTE — Progress Notes (Signed)
[facility] PROGRESS NOTE  Patient: [name] FEO:[id number]   PCP: Aliene Beams, MD DOB: 17-Dec-1960   DOA: 01/04/2022   DOS: 01/12/2022    Subjective: No acute complaint.  No nausea no vomiting.  No BM yesterday.  Objective:   Vitals:   01/11/22 2252 01/12/22 0016 01/12/22 0653 01/12/22 1501  BP: (!) 82/68 102/82 (!) 134/91 104/63  Pulse: 75  82 98  Resp: 16  16   Temp: 97.9 F (36.6 C)  (!) 97.5 F (36.4 C) 97.6 F (36.4 C)  TempSrc: Oral  Oral Oral  SpO2: 96%  98% 99%  Weight:      Height:        No abdomen distention.  No abdominal tenderness.  Assessment and plan: Active Issues Constipation. Currently resolved.  Disposition. Medically stable.  Awaiting arrangement from home antibiotics.  Will be discharged tomorrow.  Brief Hospital course 61 year old male with past medical history of cerebral palsy nonverbal at baseline, wheelchair-bound, right hemiparesis with left-sided contracture presented to the hospital with complaints of decreased urination.  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  Along with fecal impaction. There was some concerns for UTI and a Foley catheter was changed in the ED. General surgery consulted.  ID consulted as well.  Currently on IV antibiotics.  Continue hydrotherapy in the hospital.   Principal Problem:   Wound infection Active Problems:   Pressure ulcer   UTI (urinary tract infection)   Cerebral palsy (HCC)   Osteomyelitis (HCC)    Author: Lynden Oxford, MD [facility] 01/12/2022 7:28 PM   If 7PM-7AM, please contact night-coverage at [email or web address]

## 2022-01-13 LAB — CULTURE, BLOOD (ROUTINE X 2)
Culture: NO GROWTH
Culture: NO GROWTH
Special Requests: ADEQUATE
Special Requests: ADEQUATE

## 2022-01-13 MED ORDER — BISACODYL 10 MG RE SUPP: 10.0000 mg | Freq: Every day | RECTAL | 0 refills | Status: AC | PRN

## 2022-01-13 MED ORDER — HEPARIN SOD (PORK) LOCK FLUSH 100 UNIT/ML IV SOLN
250.0000 [IU] | INTRAVENOUS | Status: AC | PRN
  Administered 2022-01-13: 250 [IU]

## 2022-01-13 MED ORDER — ACETAMINOPHEN 325 MG PO TABS: 650.0000 mg | ORAL_TABLET | Freq: Once | ORAL | Status: DC | PRN

## 2022-01-13 MED ORDER — ACETAMINOPHEN 325 MG PO TABS: 650.0000 mg | ORAL_TABLET | Freq: Once | ORAL | Status: DC

## 2022-01-13 MED ORDER — MAGNESIUM HYDROXIDE 400 MG/5ML PO SUSP: 15.0000 mL | Freq: Every day | ORAL | 0 refills | Status: AC | PRN

## 2022-01-13 NOTE — Plan of Care (Signed)
Patient is stable for discharge, discharge instructions have been given. Patient's caregiver understood instructions and all questions answered. Patient is discharged home with sister/caregiver.

## 2022-01-13 NOTE — Progress Notes (Signed)
Entered into the pt's room due to NT stating that the pt needed some Tylenol.Yet, there is no order. The sister stated that the pt looked as though he needed some Tylenol. Told her that there wasn't an order, currently, for Tylenol and that I would notify the provider on call. Messaged J. Garner Nash, NP of the above information and that the plan is for him to be discharged today with home health. He asked if he was taking oral meds well. Told him that he was taking them pretty well. He stated he would place an order. Entered the room to update the sister of this. She stated that she had already given him an extra strength Tylenol and that he was fine now.Told J. Garner Nash, NP this. He stated he would make the medication prn.---[name] Sports coach [name]

## 2022-01-13 NOTE — TOC Transition Note (Addendum)
Transition of Care [facility]) - CM/SW Discharge Note   Patient Details  Name: [name] MRN: [id number] Date of Birth: Jul 09, 1960  [facility]) CM/SW Contact:  Beckie Busing, RN Phone Number:[phone]  01/13/2022, 2:13 PM   Clinical Narrative:    CM received message from [facility] with [employer] for home health infusion confirming that patient is all set for discharge from home infusion perspective. Sister [name] had teaching today and there are no issues. Home health RN following with [facility] AVS has been updated. No other needs noted at this time. TOC will sign off.    Final next level of care: Home w Home Health Services     Patient Goals and CMS Choice        Discharge Placement                       Discharge Plan and Services                          HH Arranged: IV Antibiotics, RN HH Agency: [facility], [facility] Date [facility] Agency Contacted: 01/11/22 Time HH Agency Contacted: 1550 Representative spoke with at [facility] Agency: [employer] - Jeri Modena, RN;   [facility] - Lorenza Chick  Social Determinants of Health (SDOH) Interventions     Readmission Risk Interventions     No data to display

## 2022-01-14 ENCOUNTER — Ambulatory Visit: Payer: Medicare Other | Admitting: Urology

## 2022-01-14 DIAGNOSIS — L89150 Pressure ulcer of sacral region, unstageable: Secondary | ICD-10-CM | POA: Diagnosis not present

## 2022-01-14 DIAGNOSIS — K5289 Other specified noninfective gastroenteritis and colitis: Secondary | ICD-10-CM | POA: Diagnosis not present

## 2022-01-14 DIAGNOSIS — G808 Other cerebral palsy: Secondary | ICD-10-CM | POA: Diagnosis not present

## 2022-01-14 DIAGNOSIS — L89324 Pressure ulcer of left buttock, stage 4: Secondary | ICD-10-CM | POA: Diagnosis not present

## 2022-01-14 DIAGNOSIS — B964 Proteus (mirabilis) (morganii) as the cause of diseases classified elsewhere: Secondary | ICD-10-CM | POA: Diagnosis not present

## 2022-01-14 DIAGNOSIS — R339 Retention of urine, unspecified: Secondary | ICD-10-CM | POA: Diagnosis not present

## 2022-01-14 DIAGNOSIS — M4628 Osteomyelitis of vertebra, sacral and sacrococcygeal region: Secondary | ICD-10-CM | POA: Diagnosis not present

## 2022-01-14 DIAGNOSIS — T83511A Infection and inflammatory reaction due to indwelling urethral catheter, initial encounter: Secondary | ICD-10-CM | POA: Diagnosis not present

## 2022-01-14 DIAGNOSIS — N390 Urinary tract infection, site not specified: Secondary | ICD-10-CM | POA: Diagnosis not present

## 2022-01-14 DIAGNOSIS — T148XXA Other injury of unspecified body region, initial encounter: Secondary | ICD-10-CM | POA: Diagnosis not present

## 2022-01-14 DIAGNOSIS — M869 Osteomyelitis, unspecified: Secondary | ICD-10-CM | POA: Diagnosis not present

## 2022-01-14 DIAGNOSIS — Z452 Encounter for adjustment and management of vascular access device: Secondary | ICD-10-CM | POA: Diagnosis not present

## 2022-01-18 NOTE — Discharge Summary (Signed)
Physician Discharge Summary   Patient: [name] MRN: [id number] DOB: 1960-11-23  Admit date:     01/04/2022  Discharge date: 01/13/2022  Discharge Physician: Berle Mull  PCP: Caren Macadam, MD  Recommendations at discharge:  Follow up with PCP and establish with wound care as recommended    Follow-up Information     Caren Macadam, MD. Schedule an appointment as soon as possible for a visit in 1 week(s).   Specialty: Family Medicine Contact information: [place] [address] [place], [place], DO. Schedule an appointment as soon as possible for a visit in 3 week(s).   Specialties: Infectious Diseases, Internal Medicine Contact information: [place] [address] [phone]         [facility]             . Schedule an appointment as soon as possible for a visit in 2 week(s).   Contact information: [address] [address] [place], [facility] Follow up.   Specialty: Home Health Services Why: Your home health has been set up with [facility]. The office will call you with start of care information. If you have any  questions or concerns please call the number listed above. Contact information: [place] [address] [phone]                Discharge Diagnoses: Principal Problem:   Wound infection Active Problems:   Pressure ulcer   UTI (urinary tract infection)   Cerebral palsy (HCC)   Osteomyelitis [facility])  Hospital Course: 61 year old male with past medical history of cerebral palsy nonverbal at baseline, wheelchair-bound, right hemiparesis with left-sided contracture presented to the hospital with complaints of decreased urination.  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  Along with fecal impaction. There was some concerns  for UTI and a Foley catheter was changed in the ED. General surgery consulted.  ID consulted as well.  Will be on IV antibiotics.  Received hydrotherapy in the hospital.  Assessment and Plan  Osteomyelitis of ischial tuberosity and coccyx. Unstageable pressure ulcers on sacrum and left ischial tuberosity.  Present on admission. Was on IV vancomycin cefepime and Flagyl. Superficial wound culture positive for Proteus and E. coli.  No blood cultures performed. Blood cultures performed on 11/15.  So far negative.  PICC line placed.. General surgery consult.  Recommend hydrotherapy.  Tolerated well with improvement in wound appearance. ID consulted. Outpatient patient will be on daptomycin and ceftriaxone. Outpatient referral to wound care Order placed.  General surgery signed off.    UTI. Related to chronic indwelling Foley catheter. Follows up with urology outpatient. Foley catheter was changed in the ED on admission. Currently on antibiotics.  Urine culture grew multiple species.   History of cerebral palsy. Right hemiparesis. At risk for nonhealing wounds. Monitor.   Fecal impaction. Stercoral colitis. Continue bowel regimen.   Elevated BUN. Mild elevation of serum creatinine. Recommend oral hydration.   Consultants:  General surgery  ID  Procedures performed:  Picc line placement   DISCHARGE MEDICATION: Allergies as of 01/13/2022   No Known Allergies      Medication List     TAKE these medications    acetaminophen 325 MG tablet Commonly known as: TYLENOL Take 325 mg by mouth  every 6 (six) hours as needed for moderate pain or headache.   amoxicillin-clavulanate 875-125 MG tablet Commonly known as: AUGMENTIN Take 1 tablet by mouth 2 (two) times daily for 28 days. Start taking on: January 21, 2022   bisacodyl 10 MG suppository Commonly known as: DULCOLAX Place 1 suppository (10 mg total) rectally daily as needed for moderate constipation.   cefTRIAXone   IVPB Commonly known as: ROCEPHIN Inject 2 g into the vein daily for 13 days. Indication:  Sacral wound / osteomyelitis First Dose: Yes Last Day of Therapy:  01/20/2022 Labs - Once weekly:  CBC/D and BMP, Labs - Every other week:  ESR and CRP Method of administration: IV Push Remove PICC line at the completion of IV therapy  Method of administration may be changed at the discretion of home infusion pharmacist based upon assessment of the patient and/or caregiver's ability to self-administer the medication ordered.   daptomycin  IVPB Commonly known as: CUBICIN Inject 400 mg into the vein daily for 13 days. Indication:  Sacral wound / osteomyelitis First Dose: Yes Last Day of Therapy:  01/20/2022 Labs - Once weekly:  CBC/D, BMP, and CPK Labs - Every other week:  ESR and CRP Method of administration: IV Push Remove PICC line at the completion of IV therapy  Method of administration may be changed at the discretion of home infusion pharmacist based upon assessment of the patient and/or caregiver's ability to self-administer the medication ordered.   docusate sodium 100 MG capsule Commonly known as: Colace Take 1 capsule (100 mg total) by mouth 2 (two) times daily.   doxycycline 100 MG capsule Commonly known as: Vibramycin Take 1 capsule (100 mg total) by mouth 2 (two) times daily for 28 days. Start taking on: January 21, 2022   leptospermum manuka honey Pste paste Apply 1 Application topically daily.   magnesium hydroxide 400 MG/5ML suspension Commonly known as: MILK OF MAGNESIA Take 15 mLs by mouth daily as needed for mild constipation.   metroNIDAZOLE 500 MG tablet Commonly known as: Flagyl Take 1 tablet (500 mg total) by mouth 2 (two) times daily for 12 days.   multivitamin with minerals Tabs tablet Take 1 tablet by mouth daily.   nutrition supplement (JUVEN) Pack Take 1 packet by mouth 2 (two) times daily between meals.   Ensure Max Protein Liqd Take 330 mLs (11 oz  total) by mouth daily.   polyethylene glycol 17 g packet Commonly known as: MIRALAX / GLYCOLAX Take 17 g by mouth daily.   silodosin 8 MG Caps capsule Commonly known as: RAPAFLO Take 1 capsule (8 mg total) by mouth daily with breakfast.   simethicone 80 MG chewable tablet Commonly known as: MYLICON Chew 1 tablet (80 mg total) by mouth 4 (four) times daily.               Discharge Care Instructions  (From admission, onward)           Start     Ordered   01/13/22 0000  Discharge wound care:       Comments: Apply Medihoney to sacrum and left ischium wounds Q day, then cover with foam dressings: apply Medihoney to kerlex packing and fill left ischium with swab. Apply Medihoney to 2X2 and apply to sacrum. (Change foam dressings Q 3 days or PRN soiling.)   01/13/22 1023   01/10/22 0000  Change dressing on IV access line weekly and PRN  (Home infusion instructions - [facility] )  01/10/22 1337           Disposition: Home Diet recommendation: Regular diet  Discharge Exam: Vitals:   01/12/22 0653 01/12/22 1501 01/12/22 2211 01/13/22 0606  BP: (!) 134/91 104/63 (!) 147/109 107/84  Pulse: 82 98 83 77  Resp: _0 Temp: (!) 97.5 F (36.4 C) 97.6 F (36.4 C) 97.9 F (36.6 C) 98.1 F (36.7 C)  TempSrc: Oral Oral Oral Oral  SpO2: 98% 99% 96% 99%  Weight:      Height:       General: Appear in no distress; no visible Abnormal Neck Mass Or lumps, Conjunctiva normal Cardiovascular: S1 and S2 Present, no Murmur, Respiratory: good respiratory effort, Bilateral Air entry present and CTA, no Crackles, no wheezes Abdomen: Bowel Sound present,  Extremities: no Pedal edema Neurology: alert and non verbal chronic contracture and hemiparesis Filed Weights   01/05/22 1900 01/06/22 0450 01/08/22 0610  Weight: 54.4 kg 51.3 kg 51.3 kg   Condition at discharge: stable  The results of significant diagnostics from this hospitalization (including imaging,  microbiology, ancillary and laboratory) are listed below for reference.   Imaging Studies: [place] EKG SITE RITE  Result Date: 01/10/2022 If Site Rite image not attached, placement could not be confirmed due to current cardiac rhythm.  DG Abd Portable 1V  Result Date: 01/08/2022 CLINICAL DATA:  Constipation EXAM: PORTABLE ABDOMEN - 1 VIEW COMPARISON:  CT abdomen and pelvis 01/04/2022, CT pelvis 01/04/2022, abdominal radiograph 01/06/2022 FINDINGS: Air-filled loops of small and large bowel are seen throughout the abdomen. There is no supine evidence of pneumoperitoneum. Redemonstrated is a large amount of stool within the rectum, not significantly changed compared to prior abdominal radiograph. No definite evidence of pneumatosis. No acute osseous abnormality. IMPRESSION: 1. Large amount of stool within the rectum, not significantly changed compared to prior abdominal radiograph. 2. Air-filled loops of nondilated small and large bowel are seen throughout the abdomen, not significnatly changed in size from prior exam from 01/06/22. Electronically Signed   By: Marin Roberts M.D.   On: 01/08/2022 07:13   DG Abd Portable 1V  Result Date: 01/06/2022 CLINICAL DATA:  61 year old male presents for evaluation of constipation. Imaging limited by patient condition. EXAM: PORTABLE ABDOMEN - 1 VIEW COMPARISON:  CT abdomen and pelvis acquired on January 04, 2022. FINDINGS: Image rotated to the RIGHT. Lower pelvis and symphysis pubis not imaged. Abundant stool again noted in the rectum. Moderate stool in the ascending colon. Scattered loops of mildly dilated gas-filled small bowel. No abnormal calcifications. Soft tissues are grossly normal. On limited assessment no acute skeletal findings. IMPRESSION: 1. Abundant stool again noted in the rectum. Moderate stool in the ascending colon. 2. Scattered loops of mildly dilated gas-filled small bowel. Findings likely reflect developing ileus. Correlate with any signs of fecal  impaction. Electronically Signed   By: Zetta Bills M.D.   On: 01/06/2022 09:28   MR PELVIS W WO CONTRAST  Result Date: 01/05/2022 CLINICAL DATA:  Left ischial decubitus ulcer, query osteomyelitis EXAM: MRI PELVIS WITHOUT AND WITH CONTRAST TECHNIQUE: Multiplanar multisequence MR imaging of the pelvis was performed both before and after administration of intravenous contrast. CONTRAST:  5.75m GADAVIST GADOBUTROL 1 MMOL/ML IV SOLN COMPARISON:  CT pelvis 01/04/2022 FINDINGS: Urinary Tract: Thick-walled urinary bladder with Foley catheter in place. Cystitis not excluded. Bowel: Prominence of gas and stool in the rectum as on the CT scan suggesting fecal impaction. There is some trace perirectal edema, cannot exclude low-grade stercoral colitis  although rectal wall enhancement pattern is normal. Vascular/Lymphatic: Unremarkable Reproductive: Mildly asymmetric reduced T2 signal in the left posterolateral peripheral zone of the prostate gland, nonspecific. Small right scrotal hydrocele. Other: Trace presacral edema. There is potentially a small amount of right eccentric ascites along the right paracolic gutter on uppermost images. Musculoskeletal: Left ischial osteomyelitis noted with abnormal enhancement and edema in the left ischium underlying a grade 4 decubitus ulcer which extends all the way to the periosteal margin as shown on image 34 series 15 no overt bony resorption. There is abnormal edema the adjacent gluteal and obturator internus muscles along the margins of the ulcer. No definite connectivity with the left hip joint. There is likewise a decubitus ulcer posteriorly overlying the coccyx on image 30 of series 15, extending all the way down to the level of the coccyx. There is associated abnormal edema and enhancement in the coccyx on images 29-30 of series 13 compatible with coccygeal osteomyelitis. Trace edema along the pubic bones inferiorly, likely due to low-grade arthropathy. IMPRESSION: 1. Grade 4  decubitus ulcer posterior to the left ischium with underlying left ischial osteomyelitis. There is also a grade 4 decubitus ulcer posteriorly overlying the coccyx with associated coccygeal osteomyelitis. Adjacent edema and enhancement in involve musculature including gluteal and left obturator internus musculature. 2. Thick-walled urinary bladder with Foley catheter in place. Cystitis not excluded. 3. Prominence of gas and stool in the rectum as on the CT scan suggesting fecal impaction. There is some trace perirectal edema, cannot exclude low-grade stercoral colitis although rectal wall enhancement pattern is normal. 4. Small right scrotal hydrocele. 5. Mildly asymmetric reduced T2 signal in the left posterolateral peripheral zone of the prostate gland, nonspecific. Electronically Signed   By: Van Clines M.D.   On: 01/05/2022 11:56   CT PELVIS LIMITED WO CONTRAST  Result Date: 01/04/2022 CLINICAL DATA:  Gluteal decubitus ulcer. EXAM: CT PELVIS WITHOUT CONTRAST TECHNIQUE: Multidetector CT imaging of the pelvis was performed following the standard protocol without intravenous contrast. RADIATION DOSE REDUCTION: This exam was performed according to the departmental dose-optimization program which includes automated exposure control, adjustment of the mA and/or kV according to patient size and/or use of iterative reconstruction technique. COMPARISON:  CT examination performed earlier on the same date. FINDINGS: Urinary Tract: Thick walled urinary bladder with Foley's catheter in place. Bowel: Large amount of stool in the rectum suggesting fecal impaction/constipation. Vascular/Lymphatic: No pathologically enlarged lymph nodes. No significant vascular abnormality seen. Reproductive:  No mass or other significant abnormality Other:  None. Musculoskeletal: Large deep skin wound about the left ischial tuberosity with necrotic tissue in the ulcer base. The ulcer crater measures approximately 4.0 x 3.4 cm. There  are small pockets of gas ischial tuberosity without evidence of osseous erosion. IMPRESSION: 1. Large deep skin wound about the left ischial tuberosity with necrotic tissue in the ulcer base. The ulcer crater measures approximately 4.0 x 3.4 cm. There are small pockets of gas without evidence of osseous erosion. Osteomyelitis of the ischial tuberosity can not be excluded. 2. Large amount of stool in the rectum suggesting fecal impaction/constipation. 3. Thick walled urinary bladder with Foley's catheter in place. Cystitis/urinary tract infection can not be excluded. Correlate with urinalysis. Electronically Signed   By: Keane Police D.O.   On: 01/04/2022 23:28   CT ABDOMEN PELVIS WO CONTRAST  Result Date: 01/04/2022 CLINICAL DATA:  Acute nonlocalized abdominal pain. Left buttock pressure ulcer. Foley catheter malfunction EXAM: CT ABDOMEN AND PELVIS WITHOUT CONTRAST TECHNIQUE: Multidetector CT  imaging of the abdomen and pelvis was performed following the standard protocol without IV contrast. RADIATION DOSE REDUCTION: This exam was performed according to the departmental dose-optimization program which includes automated exposure control, adjustment of the mA and/or kV according to patient size and/or use of iterative reconstruction technique. COMPARISON:  10/07/2021 FINDINGS: Lower chest: Elevation of the right hemidiaphragm noted. Visualized lung bases are clear. Visualized heart and pericardium are unremarkable. Hepatobiliary: No focal liver abnormality is seen. No gallstones, gallbladder wall thickening, or biliary dilatation. Pancreas: Unremarkable Spleen: Unremarkable Adrenals/Urinary Tract: The adrenal glands are unremarkable. Status post right nephrectomy. The Jew[place] left kidney is unremarkable on this noncontrast examination. Foley catheter balloon seen within a decompressed bladder lumen. There is marked circumferential bladder wall thickening and perivesicular inflammatory stranding suggesting a  superimposed infectious or inflammatory cystitis. No perivesicular fluid collections are identified. Stomach/Bowel: There is large volume stool within the a rectosigmoid colon and rectal vault suggesting changes of fecal impaction. Stomach, small bowel, and large bowel are otherwise unremarkable. The appendix is not clearly identified. No free intraperitoneal gas or fluid. Vascular/Lymphatic: No significant vascular findings are present. No enlarged abdominal or pelvic lymph nodes. Reproductive: Prostate gland is unremarkable. Other: Tiny fat containing umbilical hernia. Musculoskeletal: The pelvis and gluteal soft tissues are incompletely included on this examination. Reported left gluteal ulcer is not clearly identified on this exam. Visualized osseous structures are unremarkable. No acute bone abnormality. IMPRESSION: 1. Foley catheter balloon within a decompressed bladder lumen. Marked circumferential bladder wall thickening and perivesicular inflammatory stranding suggesting a superimposed infectious or inflammatory cystitis. Correlation with urinalysis and urine culture may be helpful. 2. Large volume stool within the rectosigmoid colon and rectal vault suggesting changes of fecal impaction. 3. Left gluteal decubitus wound not clearly identified on this examination, possibly excluded from view. If indicated, repeat imaging may be performed at no charge to better assess the gluteal soft tissues. Electronically Signed   By: Fidela Salisbury M.D.   On: 01/04/2022 21:35    Microbiology: Results for orders placed or performed during the hospital encounter of 01/04/22  Aerobic/Anaerobic Culture w Gram Stain (surgical/deep wound)     Status: None   Collection Time: 01/05/22  4:49 AM   Specimen: Wound  Result Value Ref Range Status   Specimen Description   Final    WOUND SITE NOT SPECIFIED Performed at [facility] [address], [place], [place] [address]    Special Requests   Final     NONE Performed at [facility], [place] [address], [place], [place] [address]    Gram Stain   Final    Emmit [place] NEGATIVE RODS MODERATE GRAM POSITIVE RODS FEW GRAM POSITIVE COCCI IN CHAINS FEW WBC PRESENT, PREDOMINANTLY PMN    Culture   Final    FEW PROTEUS MIRABILIS FEW ESCHERICHIA COLI FEW STREPTOCOCCUS ANGINOSIS FEW BACTEROIDES VULGATUS FEW CLOSTRIDIUM CLOSTRIDIOFORME BETA LACTAMASE POSITIVE Performed at [facility], [place] [address] [place], [place] [address]    Report Status 01/11/2022 FINAL  Final   Organism ID, Bacteria PROTEUS MIRABILIS  Final   Organism ID, Bacteria ESCHERICHIA COLI  Final   Organism ID, Bacteria STREPTOCOCCUS ANGINOSIS  Final      Susceptibility   Escherichia coli - MIC*    AMPICILLIN >=32 RESISTANT Resistant     CEFAZOLIN >=64 RESISTANT Resistant     CEFEPIME <=0.12 SENSITIVE Sensitive     CEFTAZIDIME <=1 SENSITIVE Sensitive     CEFTRIAXONE <=0.25 SENSITIVE Sensitive  CIPROFLOXACIN <=0.25 SENSITIVE Sensitive     GENTAMICIN <=1 SENSITIVE Sensitive     IMIPENEM <=0.25 SENSITIVE Sensitive     TRIMETH/SULFA <=20 SENSITIVE Sensitive     AMPICILLIN/SULBACTAM 16 INTERMEDIATE Intermediate     PIP/TAZO <=4 SENSITIVE Sensitive     * FEW ESCHERICHIA COLI   Proteus mirabilis - MIC*    AMPICILLIN <=2 SENSITIVE Sensitive     CEFAZOLIN <=4 SENSITIVE Sensitive     CEFEPIME <=0.12 SENSITIVE Sensitive     CEFTAZIDIME <=1 SENSITIVE Sensitive     CEFTRIAXONE <=0.25 SENSITIVE Sensitive     CIPROFLOXACIN <=0.25 SENSITIVE Sensitive     GENTAMICIN <=1 SENSITIVE Sensitive     IMIPENEM <=0.25 SENSITIVE Sensitive     TRIMETH/SULFA <=20 SENSITIVE Sensitive     AMPICILLIN/SULBACTAM <=2 SENSITIVE Sensitive     PIP/TAZO <=4 SENSITIVE Sensitive     * FEW PROTEUS MIRABILIS   Streptococcus anginosis - MIC*    PENICILLIN <=0.06 SENSITIVE Sensitive     CEFTRIAXONE <=0.12 SENSITIVE Sensitive     ERYTHROMYCIN <=0.12 SENSITIVE Sensitive      LEVOFLOXACIN 0.5 SENSITIVE Sensitive     VANCOMYCIN 0.5 SENSITIVE Sensitive     * FEW STREPTOCOCCUS ANGINOSIS  MRSA Next Gen by PCR, Nasal     Status: None   Collection Time: 01/05/22  5:18 AM   Specimen: Nasal Mucosa; Nasal Swab  Result Value Ref Range Status   MRSA by PCR Next Gen NOT DETECTED NOT DETECTED Final    Comment: (NOTE) The GeneXpert MRSA Assay (FDA approved for NASAL specimens only), is one component of a comprehensive MRSA colonization surveillance program. It is not intended to diagnose MRSA infection nor to guide or monitor treatment for MRSA infections. Test performance is not FDA approved in patients less than 36 years old. Performed at [facility], [place] [address] [place], [place] [address]   Urine Culture     Status: Abnormal   Collection Time: 01/05/22  3:31 PM   Specimen: Urine, Catheterized  Result Value Ref Range Status   Specimen Description   Final    URINE, CATHETERIZED Performed at [facility] [address], [place], [place] [address]    Special Requests   Final    NONE Performed at [facility], [place] [address] [place], [place] [address]    Culture MULTIPLE SPECIES PRESENT, SUGGEST RECOLLECTION (A)  Final   Report Status 01/06/2022 FINAL  Final  Culture, blood (Routine X 2) w Reflex to ID Panel     Status: None   Collection Time: 01/08/22  4:22 PM   Specimen: BLOOD LEFT ARM  Result Value Ref Range Status   Specimen Description   Final    BLOOD LEFT ARM Performed at [facility], [place] [address] [place], [place] [address]    Special Requests   Final    BOTTLES DRAWN AEROBIC ONLY Blood Culture adequate volume Performed at [facility] [address] [place], [place] [address]    Culture   Final    NO GROWTH 5 DAYS Performed at [facility], [place] [address] [place], [place] [address]    Report Status 01/13/2022 FINAL  Final  Culture, blood (Routine X  2) w Reflex to ID Panel     Status: None   Collection Time: 01/08/22  6:49 PM   Specimen: BLOOD LEFT HAND  Result Value Ref Range Status   Specimen Description   Final    BLOOD  LEFT HAND Performed at [facility], [place] [address], [place], [place] [address]    Special Requests   Final    BOTTLES DRAWN AEROBIC ONLY Blood Culture adequate volume Performed at [facility] [address] [place], [place] [address]    Culture   Final    NO GROWTH 5 DAYS Performed at [facility], [place] [address], [place], [place] [address]    Report Status 01/13/2022 FINAL  Final   Labs: CBC: No results for input(s): "WBC", "NEUTROABS", "HGB", "HCT", "MCV", "PLT" in the last 168 hours. Basic Metabolic Panel: Recent Labs  Lab 01/12/22 1027  NA 137  K 4.3  CL 102  CO2 28  GLUCOSE 99  BUN 46*  CREATININE 0.89  CALCIUM 8.8*  MG 2.6*   Liver Function Tests: No results for input(s): "AST", "ALT", "ALKPHOS", "BILITOT", "PROT", "ALBUMIN" in the last 168 hours. CBG: No results for input(s): "GLUCAP" in the last 168 hours.  Discharge time spent: greater than 30 minutes.  Signed: Berle Mull, MD [facility] 01/13/2022

## 2022-01-20 ENCOUNTER — Telehealth: Payer: Self-pay

## 2022-01-20 ENCOUNTER — Ambulatory Visit: Payer: Medicare Other | Admitting: Internal Medicine

## 2022-01-20 DIAGNOSIS — L89324 Pressure ulcer of left buttock, stage 4: Secondary | ICD-10-CM | POA: Diagnosis not present

## 2022-01-20 DIAGNOSIS — Z792 Long term (current) use of antibiotics: Secondary | ICD-10-CM | POA: Diagnosis not present

## 2022-01-20 DIAGNOSIS — M4628 Osteomyelitis of vertebra, sacral and sacrococcygeal region: Secondary | ICD-10-CM | POA: Diagnosis not present

## 2022-01-20 DIAGNOSIS — L89150 Pressure ulcer of sacral region, unstageable: Secondary | ICD-10-CM | POA: Diagnosis not present

## 2022-01-20 DIAGNOSIS — B964 Proteus (mirabilis) (morganii) as the cause of diseases classified elsewhere: Secondary | ICD-10-CM | POA: Diagnosis not present

## 2022-01-20 DIAGNOSIS — Z452 Encounter for adjustment and management of vascular access device: Secondary | ICD-10-CM | POA: Diagnosis not present

## 2022-01-20 NOTE — Telephone Encounter (Signed)
Patient's sister called stating patient was exposed to covid and not able to come in today. Patient is currently home with the sister.  Patient last dose of IV antibiotics should be today and picc can be removed after last dose per Dr. Earlene Plater. Patient sister advised of this information as well and HH should also have orders to pull picc. Patient's sister will start oral antibiotics tomorrow (Augmentin and Doxycycline) per Dr. Earlene Plater and patient's sister is aware.  Theodoro Koval T Pricilla Loveless

## 2022-01-20 NOTE — Progress Notes (Deleted)
[facility]  CHIEF COMPLAINT:    Follow up for sacral OM  SUBJECTIVE:    [name] is a 61 y.o. male with PMHx as below who presents to the clinic for osteomyelitis.   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  He was evaluated by surgery whom did not recommend debridement.  Superficial cultures were done in the ED that grew several different organisms all of unclear significance in the setting of just being a superficial swab.  Blood cultures were negative. He received hydrotherapy in the hospital.    He was discharged on daptomycin, ceftriaxone, and MTZ x 2 weeks through 01/20/22 and then will start Augmentin and Doxycycline x 4 more weeks to treat his sacral infection through 02/18/22.  He has a wound care appointment scheduled for 01/29/22.  Please see A&P for the details of today's visit and status of the patient's medical problems.   Patient's Medications  New Prescriptions   No medications on file  Previous Medications   ACETAMINOPHEN (TYLENOL) 325 MG TABLET    Take 325 mg by mouth every 6 (six) hours as needed for moderate pain or headache.   AMOXICILLIN-CLAVULANATE (AUGMENTIN) 875-125 MG TABLET    Take 1 tablet by mouth 2 (two) times daily for 28 days.   BISACODYL (DULCOLAX) 10 MG SUPPOSITORY    Place 1 suppository (10 mg total) rectally daily as needed for moderate constipation.   CEFTRIAXONE (ROCEPHIN) IVPB    Inject 2 g into the vein daily for 13 days. Indication:  Sacral wound / osteomyelitis First Dose: Yes Last Day of Therapy:  01/20/2022 Labs - Once weekly:  CBC/D and BMP, Labs - Every other week:  ESR and CRP Method of administration: IV Push Remove PICC line at the completion of IV therapy  Method of administration may be changed at the discretion of home infusion pharmacist based upon assessment of the patient and/or caregiver's  ability to self-administer the medication ordered.   DAPTOMYCIN (CUBICIN) IVPB    Inject 400 mg into the vein daily for 13 days. Indication:  Sacral wound / osteomyelitis First Dose: Yes Last Day of Therapy:  01/20/2022 Labs - Once weekly:  CBC/D, BMP, and CPK Labs - Every other week:  ESR and CRP Method of administration: IV Push Remove PICC line at the completion of IV therapy  Method of administration may be changed at the discretion of home infusion pharmacist based upon assessment of the patient and/or caregiver's ability to self-administer the medication ordered.   DOCUSATE SODIUM (COLACE) 100 MG CAPSULE    Take 1 capsule (100 mg total) by mouth 2 (two) times daily.   DOXYCYCLINE (VIBRAMYCIN) 100 MG CAPSULE    Take 1 capsule (100 mg total) by mouth 2 (two) times daily for 28 days.   ENSURE MAX PROTEIN (ENSURE MAX PROTEIN) LIQD    Take 330 mLs (11 oz total) by mouth daily.   LEPTOSPERMUM MANUKA HONEY (MEDIHONEY) PSTE PASTE    Apply 1 Application topically daily.   MAGNESIUM HYDROXIDE (MILK OF MAGNESIA) 400 MG/5ML SUSPENSION    Take 15 mLs by mouth daily as needed for mild constipation.   METRONIDAZOLE (FLAGYL) 500 MG TABLET    Take 1 tablet (500 mg total) by mouth 2 (two) times daily for 12 days.   MULTIPLE VITAMIN (MULTIVITAMIN WITH MINERALS) TABS TABLET    Take 1 tablet by mouth daily.  NUTRITION SUPPLEMENT, JUVEN, (JUVEN) PACK    Take 1 packet by mouth 2 (two) times daily between meals.   POLYETHYLENE GLYCOL (MIRALAX / GLYCOLAX) 17 G PACKET    Take 17 g by mouth daily.   SILODOSIN (RAPAFLO) 8 MG CAPS CAPSULE    Take 1 capsule (8 mg total) by mouth daily with breakfast.   SIMETHICONE (MYLICON) 80 MG CHEWABLE TABLET    Chew 1 tablet (80 mg total) by mouth 4 (four) times daily.  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 There is no height or weight on file to calculate BMI.  Physical Exam   Labs and Microbiology:    Latest Ref Rng & Units 01/09/2022    4:19 AM 01/08/2022    4:03 AM 01/07/2022    4:22 AM  CBC  WBC 4.0 - 10.5 K/uL 8.2  7.6  8.0   Hemoglobin 13.0 - 17.0 g/dL 11.0  10.3  9.7   Hematocrit 39.0 - 52.0 % 37.6  35.5  32.3   Platelets 150 - 400 K/uL 426  441  412       Latest Ref Rng & Units 01/12/2022   10:27 AM 01/09/2022    4:19 AM 01/08/2022    4:03 AM  CMP  Glucose 70 - 99 mg/dL 99  103  104   BUN 6 - 20 mg/dL 46  43  26   Creatinine 0.61 - 1.24 mg/dL 0.89  1.01  0.86   Sodium 135 - 145 mmol/L 137  137  138   Potassium 3.5 - 5.1 mmol/L 4.3  4.0  4.1   Chloride 98 - 111 mmol/L 102  106  106   CO2 22 - 32 mmol/L _0 Calcium 8.9 - 10.3 mg/dL 8.8  8.8  8.4      No results found for this or any previous visit (from the past 240 hour(s)).  Imaging: ***   ASSESSMENT & PLAN:    No problem-specific Assessment & Plan notes found for this encounter.   No orders of the defined types were placed in this encounter.    There are no diagnoses linked to this encounter.  Patient will continue antibiotics and transition to Augmentin 875 PO BID and Doxycycline 115m PO BID as planned through 02/18/22 to complete 6 weeks total therapy.  He will  also establish with wound care as planned.  Discussed with patient and sister that antibiotics alone  with this type of infection are of low utility without multidisciplinary approach of debridement, off loading, wound care, diversion, possible flap coverage in the future.  Follow up in about 4 weeks.   [facility] 01/20/2022, 6:21 AM

## 2022-01-23 DIAGNOSIS — B964 Proteus (mirabilis) (morganii) as the cause of diseases classified elsewhere: Secondary | ICD-10-CM | POA: Diagnosis not present

## 2022-01-23 DIAGNOSIS — L89150 Pressure ulcer of sacral region, unstageable: Secondary | ICD-10-CM | POA: Diagnosis not present

## 2022-01-23 DIAGNOSIS — L89324 Pressure ulcer of left buttock, stage 4: Secondary | ICD-10-CM | POA: Diagnosis not present

## 2022-01-23 DIAGNOSIS — M4628 Osteomyelitis of vertebra, sacral and sacrococcygeal region: Secondary | ICD-10-CM | POA: Diagnosis not present

## 2022-01-24 DIAGNOSIS — K567 Ileus, unspecified: Secondary | ICD-10-CM | POA: Diagnosis not present

## 2022-01-24 DIAGNOSIS — L89154 Pressure ulcer of sacral region, stage 4: Secondary | ICD-10-CM | POA: Diagnosis not present

## 2022-01-24 DIAGNOSIS — M869 Osteomyelitis, unspecified: Secondary | ICD-10-CM | POA: Diagnosis not present

## 2022-01-24 DIAGNOSIS — M4628 Osteomyelitis of vertebra, sacral and sacrococcygeal region: Secondary | ICD-10-CM | POA: Diagnosis not present

## 2022-01-24 DIAGNOSIS — Z978 Presence of other specified devices: Secondary | ICD-10-CM | POA: Diagnosis not present

## 2022-01-24 DIAGNOSIS — Z09 Encounter for follow-up examination after completed treatment for conditions other than malignant neoplasm: Secondary | ICD-10-CM | POA: Diagnosis not present

## 2022-01-24 DIAGNOSIS — L89894 Pressure ulcer of other site, stage 4: Secondary | ICD-10-CM | POA: Diagnosis not present

## 2022-01-29 ENCOUNTER — Encounter (HOSPITAL_BASED_OUTPATIENT_CLINIC_OR_DEPARTMENT_OTHER): Payer: Medicare Other | Admitting: Physician Assistant

## 2022-01-30 ENCOUNTER — Encounter (HOSPITAL_BASED_OUTPATIENT_CLINIC_OR_DEPARTMENT_OTHER): Payer: Medicare Other | Attending: General Surgery | Admitting: General Surgery

## 2022-01-30 DIAGNOSIS — L89154 Pressure ulcer of sacral region, stage 4: Secondary | ICD-10-CM | POA: Diagnosis not present

## 2022-01-30 DIAGNOSIS — G809 Cerebral palsy, unspecified: Secondary | ICD-10-CM | POA: Diagnosis not present

## 2022-01-30 DIAGNOSIS — M869 Osteomyelitis, unspecified: Secondary | ICD-10-CM | POA: Diagnosis not present

## 2022-01-30 DIAGNOSIS — L89324 Pressure ulcer of left buttock, stage 4: Secondary | ICD-10-CM | POA: Insufficient documentation

## 2022-01-31 DIAGNOSIS — G809 Cerebral palsy, unspecified: Secondary | ICD-10-CM | POA: Diagnosis not present

## 2022-01-31 DIAGNOSIS — L89154 Pressure ulcer of sacral region, stage 4: Secondary | ICD-10-CM | POA: Diagnosis not present

## 2022-01-31 DIAGNOSIS — Z993 Dependence on wheelchair: Secondary | ICD-10-CM | POA: Diagnosis not present

## 2022-01-31 NOTE — Progress Notes (Signed)
[name], [name] ([id number]) [id number] Page 1 of 10 Visit Report for 01/30/2022 Chief Complaint Document Details Patient Name: Date of Service: [name], [place] 01/30/2022 9:00 A M Medical Record Number: [id number] Patient Account Number: [id number] Date of Birth/Sex: Treating RN: Jun 29, 1960 (61 y.o. M) Primary Care Provider: Aliene Wise Other Clinician: Referring Provider: Treating Provider/Extender: [name] in Treatment: 0 Information Obtained from: Patient Chief Complaint Patient is at the clinic for treatment of open pressure ulcers Electronic Signature(s) Signed: 01/30/2022 10:05:06 AM By: Brett Guess MD FACS Previous Signature: 01/30/2022 9:10:03 AM Version By: Brett Guess MD FACS Entered By: [name] on 01/30/2022 10:05:06 -------------------------------------------------------------------------------- Debridement Details Patient Name: Date of Service: [name], Brett BERT Wise. 01/30/2022 9:00 A M Medical Record Number: [id number] Patient Account Number: [id number] Date of Birth/Sex: Treating RN: 30-Dec-1960 (61 y.o. [name] Primary Care Provider: Aliene Wise Other Clinician: Referring Provider: Treating Provider/Extender: [name] in Treatment: 0 Debridement Performed for Assessment: Wound #2 Left Ischial Tuberosity Performed By: Physician Brett Guess, MD Debridement Type: Debridement Level of Consciousness (Pre-procedure): Awake and Alert Pre-procedure Verification/Time Out Yes - 09:50 Taken: Start Time: 09:51 Pain Control: Lidocaine 5% topical ointment T Area Debrided (Wise x W): otal 6 (cm) x 4.5 (cm) = 27 (cm) Tissue and other material debrided: Viable, Non-Viable, Slough, Subcutaneous, Slough Level: Skin/Subcutaneous Tissue Debridement Description: Excisional Instrument: Curette Bleeding: Minimum Hemostasis Achieved: Pressure Response to Treatment:  Procedure was tolerated well Level of Consciousness (Post- Awake and Alert procedure): Post Debridement Measurements of Total Wound Length: (cm) 6 Width: (cm) 4.5 Depth: (cm) 1.5 Volume: (cm) 31.809 Character of Wound/Ulcer Post Debridement: Requires Further Debridement Post Procedure Diagnosis Same as Pre-procedure [name], [name] ([id number]) [id number] Page 2 of 10 Notes Scribed for Dr. Lady Wise by Brett Ard, RN Electronic Signature(s) Signed: 01/30/2022 10:43:45 AM By: Brett Guess MD FACS Signed: 01/30/2022 4:16:33 PM By: Brett Ard RN Entered By: [name] on 01/30/2022 09:56:43 -------------------------------------------------------------------------------- Debridement Details Patient Name: Date of Service: [name], Brett BERT Wise. 01/30/2022 9:00 A M Medical Record Number: [id number] Patient Account Number: [id number] Date of Birth/Sex: Treating RN: [birth year] (61 y.o. [name] Primary Care Provider: Aliene Wise Other Clinician: Referring Provider: Treating Provider/Extender: [name] in Treatment: 0 Debridement Performed for Assessment: Wound #1 Coccyx Performed By: Physician Brett Guess, MD Debridement Type: Debridement Level of Consciousness (Pre-procedure): Awake and Alert Pre-procedure Verification/Time Out Yes - 09:50 Taken: Start Time: 09:51 Pain Control: Lidocaine 5% topical ointment T Area Debrided (Wise x W): otal 2.8 (cm) x 1.5 (cm) = 4.2 (cm) Tissue and other material debrided: Non-Viable, Slough, Slough Level: Non-Viable Tissue Debridement Description: Selective/Open Wound Instrument: Curette Bleeding: Minimum Hemostasis Achieved: Pressure Response to Treatment: Procedure was tolerated well Level of Consciousness (Post- Awake and Alert procedure): Post Debridement Measurements of Total Wound Length: (cm) 2.8 Width: (cm) 1.5 Depth: (cm) 0.6 Volume: (cm) 1.979 Character of  Wound/Ulcer Post Debridement: Requires Further Debridement Post Procedure Diagnosis Same as Pre-procedure Notes Scribed for Dr, [name] by Brett Ard, RN Electronic Signature(s) Signed: 01/30/2022 10:43:45 AM By: Brett Guess MD FACS Signed: 01/30/2022 4:16:33 PM By: Brett Ard RN Entered By: [name] on 01/30/2022 09:56:57 -------------------------------------------------------------------------------- HPI Details Patient Name: Date of Service: [name], Brett BERT Wise. 01/30/2022 9:00 A M Medical Record Number: [id number] Patient Account Number: [id number] Date of Birth/Sex: Treating RN: 1960/06/14 (61 y.o. M) Primary Care Provider: Aliene Wise Other Clinician: [name], [name] ([id number]) [id number] Page  3 of 10 Referring Provider: Treating Provider/Extender: Brett Dupesannon, [name], [name] in Treatment: 0 History of Present Illness HPI Description: ADMISSION 01/30/2022 This is a 61 year old man with cerebral palsy. He is nonverbal at baseline. He is accompanied by his sister, who is his primary caregiver, and his aunt. He was admitted to the hospital on November 11 with urinary retention and was identified as having a left gluteal pressure ulcer and a sacral pressure ulcer. His sister says that the wounds have been there for about a month. A CT scan of the abdomen and pelvis was performed that was suggestive of osteomyelitis. There was also concern for infection of his wound. He was placed on antibiotics. General surgery was consulted and did not think there was any role for surgical intervention. They have been managing the wounds at home with topical Medihoney and then packing the cavities with saline moistened gauze. A Roho cushion has already been ordered for his wheelchair and they are in the process of obtaining a low-air-loss mattress bed for him. The ulcer over his sacrum is fairly small with just a little bit of depth to it. There is  good granulation tissue present with just a bit of slough in the base. The ischial ulcer is more extensive and there is tendon exposed at the base. There is also evidence of ongoing pressure-induced tissue injury. Electronic Signature(s) Signed: 01/30/2022 10:07:03 AM By: Brett Guessannon, Brett Croson MD FACS Previous Signature: 01/30/2022 9:12:07 AM Version By: Brett Guessannon, Ceyda Peterka MD FACS Entered By: Brett Guessannon, Kambrey Hagger on 01/30/2022 10:07:03 -------------------------------------------------------------------------------- Physical Exam Details Patient Name: Date of Service: Brett NearingBA Wise, Brett BERT Wise. 01/30/2022 9:00 A M Medical Record Number: [id number] Patient Account Number: [id number] Date of Birth/Sex: Treating RN: 08/17/1960 (61 y.o. M) Primary Care Provider: Aliene BeamsHagler, Brett Other Clinician: Referring Provider: Treating Provider/Extender: Brett Dupesannon, Genene Kilman Wise, [name] in Treatment: 0 Constitutional . . . . No acute distress. Respiratory Normal work of breathing on room air.. Notes 01/30/2022: The ulcer over his sacrum is fairly small with just a little bit of depth to it. There is good granulation tissue present with just a bit of slough in the base. The ischial ulcer is more extensive and there is tendon exposed at the base. There is also evidence of ongoing pressure-induced tissue injury. Electronic Signature(s) Signed: 01/30/2022 10:07:43 AM By: Brett Guessannon, Fatema Rabe MD FACS Entered By: Brett Guessannon, Tildon Silveria on 01/30/2022 10:07:43 -------------------------------------------------------------------------------- Physician Orders Details Patient Name: Date of Service: Brett NearingBA Wise, Brett BERT Wise. 01/30/2022 9:00 A M Medical Record Number: [id number] Patient Account Number: [id number] Date of Birth/Sex: Treating RN: 12/30/1960 (61 y.o. Brett CavaM) Zochol, Jamie Primary Care Provider: Aliene BeamsHagler, Brett Other Clinician: Referring Provider: Treating Provider/Extender: Brett Dupesannon, Ladrea Holladay Wise, [name] in Treatment: 0 Verbal / Phone  Orders: No Diagnosis Coding ICD-10 Coding Geanie KenningBATES, Corky Wise ([id number]) [id number] Page 4 of 10 Code Description G80.9 Cerebral palsy, unspecified M86.9 Osteomyelitis, unspecified L89.154 Pressure ulcer of sacral region, 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, stage 4 Follow-up Appointments ppointment in 2 Wise. - Dr. Lady Garyannon Return A ***HOYER*** EXTRA TIME Anesthetic Wound #1 Coccyx (In clinic) Topical Lidocaine 5% applied to wound bed - in clinic, prior to debridement Wound #2 Left Ischial Tuberosity (In clinic) Topical Lidocaine 5% applied to wound bed - in clinic, prior to debridement Bathing/ Shower/ Hygiene May shower and wash wound with soap and water. Wound Treatment Wound #1 - Coccyx Cleanser: Soap and Water Discharge Instructions: May shower and wash wound with dial antibacterial soap and water prior to dressing change.  Peri-Wound Care: Skin Prep Discharge Instructions: Use skin prep as directed Peri-Wound Care: Zinc Oxide Ointment 30g tube Discharge Instructions: Apply Zinc Oxide to periwound with each dressing change Prim Dressing: MediHoney Gel, tube 1.5 (oz) ary Discharge Instructions: Apply to wound bed as instructed Secondary Dressing: Zetuvit Plus Silicone Border Sacrum Dressing, Sm, 7x7 (in/in) Discharge Instructions: Apply silicone border over primary dressing as directed. Wound #2 - Ischial Tuberosity Wound Laterality: Left Cleanser: Soap and Water Discharge Instructions: May shower and wash wound with dial antibacterial soap and water prior to dressing change. Peri-Wound Care: Skin Prep Discharge Instructions: Use skin prep as directed Peri-Wound Care: Zinc Oxide Ointment 30g tube Discharge Instructions: Apply Zinc Oxide to periwound with each dressing change Prim Dressing: MediHoney Gel, tube 1.5 (oz) ary Discharge Instructions: Apply to wound bed as instructed Secondary Dressing: Zetuvit Plus Silicone Border Dressing  5x5 (in/in) Discharge Instructions: Apply silicone border over primary dressing as directed. Secured With: 70M Medipore Scientist, research (life sciences) Surgical T 2x10 (in/yd) ape Discharge Instructions: Secure with tape as directed. Electronic Signature(s) Signed: 01/30/2022 4:16:33 PM By: Brett Ard RN Signed: 01/31/2022 7:32:56 AM By: Brett Guess MD FACS Previous Signature: 01/30/2022 10:43:45 AM Version By: Brett Guess MD FACS Entered By: [name] on 01/30/2022 16:06:49 -------------------------------------------------------------------------------- Problem List Details Patient Name: Date of Service: [name], Brett BERT Wise. 01/30/2022 9:00 A SOMNANG, [name] ([id number]) [id number] Page 5 of 10 Medical Record Number: [id number] Patient Account Number: [id number] Date of Birth/Sex: Treating RN: Nov 01, 1960 (61 y.o. M) Primary Care Provider: Aliene Wise Other Clinician: Referring Provider: Treating Provider/Extender: [name] in Treatment: 0 Active Problems ICD-10 Encounter Code Description Active Date MDM Diagnosis G80.9 Cerebral palsy, unspecified 01/30/2022 No Yes M86.9 Osteomyelitis, unspecified 01/30/2022 No Yes L89.154 Pressure ulcer of sacral region, stage 4 01/30/2022 No Yes 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, stage 4 01/30/2022 No Yes Inactive Problems Resolved Problems Electronic Signature(s) Signed: 01/30/2022 10:04:53 AM By: Brett Guess MD FACS Previous Signature: 01/30/2022 9:09:13 AM Version By: Brett Guess MD FACS Entered By: [name] on 01/30/2022 10:04:53 -------------------------------------------------------------------------------- Progress Note Details Patient Name: Date of Service: [name], Brett BERT Wise. 01/30/2022 9:00 A M Medical Record Number: [id number] Patient Account Number: [id number] Date of Birth/Sex: Treating RN: 1960-07-12 (61 y.o. M) Primary Care Provider: Aliene Wise Other  Clinician: Referring Provider: Treating Provider/Extender: [name] in Treatment: 0 Subjective Chief Complaint Information obtained from Patient Patient is at the clinic for treatment of open pressure ulcers History of Present Illness (HPI) ADMISSION 01/30/2022 This is a 61 year old man with cerebral palsy. He is nonverbal at baseline. He is accompanied by his sister, who is his primary caregiver, and his aunt. He was admitted to the hospital on November 11 with urinary retention and was identified as having a left gluteal pressure ulcer and a sacral pressure ulcer. His sister says that the wounds have been there for about a month. A CT scan of the abdomen and pelvis was performed that was suggestive of osteomyelitis. There was also concern for infection of his wound. He was placed on antibiotics. General surgery was consulted and did not think there was any role for surgical intervention. They have been managing the wounds at home with topical Medihoney and then packing the cavities with saline moistened gauze. A Roho cushion has already been ordered for his wheelchair and they are in the process of obtaining a low-air-loss mattress bed for him. The ulcer over his sacrum is fairly small with  just a little bit of depth to it. There is good granulation tissue present with just a bit of slough in the base. The ischial ulcer is more extensive and there is tendon exposed at the base. There is also evidence of ongoing pressure-induced tissue injury. Patient History Information obtained from Patient. LEIBY, [name] ([id number]) [id number] Page 6 of 10 Allergies No Known Allergies Family History Cancer - Father,Maternal Grandparents, Diabetes - Mother, Heart Disease - Mother, Hypertension - Siblings,Father,Mother, No family history of Hereditary Spherocytosis, Kidney Disease, Lung Disease, Seizures, Stroke, Thyroid Problems,  Tuberculosis. Social History Never smoker, Marital Status - Single, Alcohol Use - Never, Drug Use - No History, Caffeine Use - Daily. Medical History Eyes Denies history of Cataracts, Glaucoma, Optic Neuritis Ear/Nose/Mouth/Throat Denies history of Chronic sinus problems/congestion, Middle ear problems Hematologic/Lymphatic Denies history of Anemia, Hemophilia, Human Immunodeficiency Virus, Lymphedema, Sickle Cell Disease Respiratory Denies history of Aspiration, Asthma, Chronic Obstructive Pulmonary Disease (COPD), Pneumothorax, Sleep Apnea, Tuberculosis Cardiovascular Denies history of Angina, Arrhythmia, Congestive Heart Failure, Coronary Artery Disease, Deep Vein Thrombosis, Hypertension, Hypotension, Myocardial Infarction, Peripheral Arterial Disease, Peripheral Venous Disease, Phlebitis, Vasculitis Endocrine Denies history of Type I Diabetes, Type II Diabetes Integumentary (Skin) Denies history of History of Burn Oncologic Denies history of Received Chemotherapy, Received Radiation Psychiatric Denies history of Anorexia/bulimia Medical A Surgical History Notes nd Genitourinary nephrolithiasis nonfunctioning kidney Neurologic cerebral palsy Review of Systems (ROS) Constitutional Symptoms (General Health) Denies complaints or symptoms of Fatigue, Fever, Chills, Marked Weight Change. Eyes Denies complaints or symptoms of Dry Eyes, Vision Changes, Glasses / Contacts. Ear/Nose/Mouth/Throat Denies complaints or symptoms of Chronic sinus problems or rhinitis. Respiratory Denies complaints or symptoms of Chronic or frequent coughs, Shortness of Breath. Cardiovascular Denies complaints or symptoms of Chest pain. Gastrointestinal Complains or has symptoms of Vomiting. Denies complaints or symptoms of Frequent diarrhea, Nausea. Psychiatric Denies complaints or symptoms of Claustrophobia. Objective Constitutional No acute distress. Vitals Time Taken: 8:50 AM, Height: 68 in,  Weight: 115 lbs, BMI: 17.5, Temperature: 97.5 F, Pulse: 93 bpm, Respiratory Rate: 20 breaths/min, Blood Pressure: 103/69 mmHg. Respiratory Normal work of breathing on room air.. General Notes: 01/30/2022: The ulcer over his sacrum is fairly small with just a little bit of depth to it. There is good granulation tissue present with just a bit of slough in the base. The ischial ulcer is more extensive and there is tendon exposed at the base. There is also evidence of ongoing pressure-induced tissue injury. Integumentary (Hair, Skin) Wound #1 status is Open. Original cause of wound was Pressure Injury. The date acquired was: 11/30/2021. The wound is located on the Coccyx. The wound measures 2.8cm length x 1.5cm width x 0.6cm depth; 3.299cm^2 area and 1.979cm^3 volume. There is Fat Layer (Subcutaneous Tissue) exposed. There is no tunneling or undermining noted. There is a medium amount of serous drainage noted. There is medium (34-66%) pink granulation within the wound bed. There is a medium (34-66%) amount of necrotic tissue within the wound bed including Adherent Slough. The periwound skin appearance had no abnormalities noted for color. The periwound skin appearance exhibited: Rash, Dry/Scaly. Periwound temperature was noted as No Abnormality. Wound #2 status is Open. Original cause of wound was Pressure Injury. The date acquired was: 11/30/2021. The wound is located on the Left Ischial Tuberosity. The wound measures 6cm length x 4.5cm width x 1.5cm depth; 21.206cm^2 area and 31.809cm^3 volume. There is Fat Layer (Subcutaneous Tissue) exposed. There is no undermining noted, however, there is tunneling at 12:00 with a  maximum distance of 1.5cm. There is additional tunneling and at 9:00 with a maximum distance of 1cm. There is a medium amount of serous drainage noted. The periwound skin appearance exhibited: Excoriation, Erythema. The surrounding wound skin color is noted with erythema. Periwound  temperature was noted as No Abnormality. 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 There was a Selective/Open Wound Non-Viable Tissue Debridement with a total area of 4.2 sq cm performed by Brett Guess, MD. With the following instrument(s): Curette to remove Non-Viable tissue/material. Material removed includes [facility] after achieving pain control using Lidocaine 5% topical ointment. No specimens were taken. A time out was conducted at 09:50, prior to the start of the procedure. A Minimum amount of bleeding was controlled with Pressure. The procedure was tolerated well. Post Debridement Measurements: 2.8cm length x 1.5cm width x 0.6cm depth; 1.979cm^3 volume. Character of Wound/Ulcer Post Debridement requires further debridement. Post procedure Diagnosis Wound #1: Same as Pre-Procedure General Notes: Scribed for Dr, [name] by Brett Ard, RN. Wound #2 Pre-procedure diagnosis of Wound #2 is an Acute Osteomyelitis located on the Left Ischial Tuberosity . There was a Excisional Skin/Subcutaneous Tissue Debridement with a total area of 27 sq cm performed by Brett Guess, MD. With the following instrument(s): Curette to remove Viable and Non-Viable tissue/material. Material removed includes Subcutaneous Tissue and Slough and after achieving pain control using Lidocaine 5% topical ointment. No specimens were taken. A time out was conducted at 09:50, prior to the start of the procedure. A Minimum amount of bleeding was controlled with Pressure. The procedure was tolerated well. Post Debridement Measurements: 6cm length x 4.5cm width x 1.5cm depth; 31.809cm^3 volume. Character of  Wound/Ulcer Post Debridement requires further debridement. Post procedure Diagnosis Wound #2: Same as Pre-Procedure General Notes: Scribed for Dr. Lady Wise by Brett Ard, RN. Plan Follow-up Appointments: Return Appointment in 2 Wise. - Dr. Lady Wise ***HOYER*** EXTRA TIME Anesthetic: Wound #1 Coccyx: (In clinic) Topical Lidocaine 5% applied to wound bed - in clinic, prior to debridement Wound #2 Left Ischial Tuberosity: (In clinic) Topical Lidocaine 5% applied to wound bed - in clinic, prior to debridement Bathing/ Shower/ Hygiene: May shower and wash wound with soap and water. WOUND #1: - Coccyx Wound Laterality: Cleanser: Soap and Water Discharge Instructions: May shower and wash wound with dial antibacterial soap and water prior to dressing change. Peri-Wound Care: Skin Prep Discharge Instructions: Use skin prep as directed Peri-Wound Care: Zinc Oxide Ointment 30g tube Discharge Instructions: Apply Zinc Oxide to periwound with each dressing change Prim Dressing: MediHoney Gel, tube 1.5 (oz) ary Discharge Instructions: Apply to wound bed as instructed Secondary Dressing: ABD Pad, 8x10 Discharge Instructions: Apply over primary dressing as directed. Secondary Dressing: Zetuvit Plus Silicone Border Sacrum Dressing, Sm, 7x7 (in/in) Discharge Instructions: Apply silicone border over primary dressing as directed. WOUND #2: - Ischial Tuberosity Wound Laterality: Left Cleanser: Soap and Water Discharge Instructions: May shower and wash wound with dial antibacterial soap and water prior to dressing change. Peri-Wound Care: Skin Prep Discharge Instructions: Use skin prep as directed Peri-Wound Care: Zinc Oxide Ointment 30g tube Discharge Instructions: Apply Zinc Oxide to periwound with each dressing change Prim Dressing: MediHoney Gel, tube 1.5 (oz) ary Discharge Instructions: Apply to wound bed as instructed Secondary Dressing: ABD Pad,  8x10 Discharge Instructions: Apply over primary  dressing as directed. Secured With: 42M Medipore Scientist, research (life sciences) Surgical T 2x10 (in/yd) ape Discharge Instructions: Secure with tape as directed. 01/30/2022: This is a 61 year old man with severe cerebral palsy. He has pressure ulcers on his sacrum and left ischium. The ulcer over his sacrum is fairly small with just a little bit of depth to it. There is good granulation tissue present with just a bit of slough in the base. The ischial ulcer is more extensive and [name], [name] ([id number]) [phone].pdf Page 8 of 10 there is tendon exposed at the base. There is also evidence of ongoing pressure-induced tissue injury. I used a curette to debride slough from the sacral ulcer and slough and nonviable subcutaneous tissue from the ischial ulcer. I think he will benefit from the Roho cushion and low-air-loss mattress bed and hopefully these will be provided shortly. His sister says that he is getting Juven as well as a protein drink daily. I asked her to increase this to 2 protein shakes per day and continue the Juven. I pointed out the area of pressure induced tissue injury and discussed ways to further offload the site. I think 1 major problem is the perpetual soilage of the area with both urine and stool. He would probably benefit from a diverting ostomy as well as a suprapubic catheter. They are seeing urology next week for further discussion in this regard. Apparently they were told when he was in the hospital that he needed to heal his wound first before getting a diverting ostomy, which frankly makes no sense whatsoever. I will look into this further. He will continue on doxycycline and Augmentin as prescribed by infectious disease for his osteomyelitis. They will follow-up in 2 Wise time. Electronic Signature(s) Signed: 01/30/2022 10:11:20 AM By: Brett Guess MD FACS Previous Signature: 01/30/2022 10:10:00 AM Version By: Brett Guess MD FACS Entered By: [name] on 01/30/2022 10:11:20 -------------------------------------------------------------------------------- HxROS Details Patient Name: Date of Service: [name], Brett BERT Wise. 01/30/2022 9:00 A M Medical Record Number: [id number] Patient Account Number: [id number] Date of Birth/Sex: Treating RN: 14-Jan-1961 (61 y.o. [name] Primary Care Provider: Aliene Wise Other Clinician: Referring Provider: Treating Provider/Extender: [name] in Treatment: 0 Information Obtained From Patient Constitutional Symptoms (General Health) Complaints and Symptoms: Negative for: Fatigue; Fever; Chills; Marked Weight Change Eyes Complaints and Symptoms: Negative for: Dry Eyes; Vision Changes; Glasses / Contacts Medical History: Negative for: Cataracts; Glaucoma; Optic Neuritis Ear/Nose/Mouth/Throat Complaints and Symptoms: Negative for: Chronic sinus problems or rhinitis Medical History: Negative for: Chronic sinus problems/congestion; Middle ear problems Respiratory Complaints and Symptoms: Negative for: Chronic or frequent coughs; Shortness of Breath Medical History: Negative for: Aspiration; Asthma; Chronic Obstructive Pulmonary Disease (COPD); Pneumothorax; Sleep Apnea; Tuberculosis Cardiovascular Complaints and Symptoms: Negative for: Chest pain Medical History: Negative for: Angina; Arrhythmia; Congestive Heart Failure; Coronary Artery Disease; Deep Vein Thrombosis; Hypertension; Hypotension; Myocardial Infarction; Peripheral Arterial Disease; Peripheral Venous Disease; Phlebitis; Vasculitis Gastrointestinal Complaints and Symptoms: Positive for: Vomiting Negative for: Frequent diarrhea; Nausea [name], [name] ([id number]) [id number] Page 9 of 10 Psychiatric Complaints and Symptoms: Negative for: Claustrophobia Medical History: Negative for: Anorexia/bulimia Hematologic/Lymphatic Medical History: Negative for: Anemia;  Hemophilia; Human Immunodeficiency Virus; Lymphedema; Sickle Cell Disease Endocrine Medical History: Negative for: Type I Diabetes; Type II Diabetes Genitourinary Medical History: Past Medical History Notes: nephrolithiasis nonfunctioning kidney Integumentary (Skin) Medical History: Negative for: History of Burn Neurologic Medical History: Past Medical History Notes: cerebral palsy Oncologic Medical  History: Negative for: Received Chemotherapy; Received Radiation Immunizations Pneumococcal Vaccine: Received Pneumococcal Vaccination: Yes Received Pneumococcal Vaccination On or After 60th Birthday: No Implantable Devices None Family and Social History Cancer: Yes - Father,Maternal Grandparents; Diabetes: Yes - Mother; Heart Disease: Yes - Mother; Hereditary Spherocytosis: No; Hypertension: Yes - Siblings,Father,Mother; Kidney Disease: No; Lung Disease: No; Seizures: No; Stroke: No; Thyroid Problems: No; Tuberculosis: No; Never smoker; Marital Status - Single; Alcohol Use: Never; Drug Use: No History; Caffeine Use: Daily; Financial Concerns: No; Food, Clothing or Shelter Needs: No; Support System Lacking: No; Transportation Concerns: No Physician Affirmation I have reviewed and agree with the above information. Electronic Signature(s) Signed: 01/30/2022 10:43:45 AM By: Brett Guess MD FACS Signed: 01/30/2022 4:16:33 PM By: Brett Ard RN Previous Signature: 01/30/2022 7:45:16 AM Version By: Brett Guess MD FACS Entered By: [name] on 01/30/2022 09:20:48 -------------------------------------------------------------------------------- SuperBill Details Patient Name: Date of Service: [name], Brett BERT [name] 01/30/2022 [name], [name] ([id number]) [id number] Page 10 of 10 Medical Record Number: [id number] Patient Account Number: [id number] Date of Birth/Sex: Treating RN: 1960-05-05 (61 y.o. 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, stage 4 Modifier: Quantity: 1 : CPT4 Code: 54562563 Description: 11045 - DEB SUBQ TISS EA ADDL 20CM ICD-10 Diagnosis Description 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, stage 4 Modifier: Quantity: 1 : CPT4 Code: 89373428 Description: 97597 - DEBRIDE WOUND 1ST 20 SQ CM OR < ICD-10 Diagnosis Description L89.154 Pressure ulcer of sacral region, stage 4 Modifier: Quantity: 1 Physician Procedures : CPT4 Code Description Modifier [id number] 99204 - WC PHYS LEVEL 4 - NEW PT 25 ICD-10 Diagnosis Description L89.154 Pressure ulcer of sacral region, 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, stage 4 G80.9 Cerebral palsy, unspecified M86.9  Osteomyelitis, unspecified Quantity: 1 : [id number] 11042 - WC PHYS SUBQ TISS 20 SQ CM ICD-10 Diagnosis Description 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, stage 4 Quantity: 1 : [id number] 11045 - WC PHYS SUBQ TISS EA ADDL 20 CM ICD-10 Diagnosis Description 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

## 2022-01-31 NOTE — Progress Notes (Signed)
Brett, Wise ([id number]) [phone] Page 1 of 4 Visit Report for 01/30/2022 Abuse Risk Screen Details Patient Name: Date of Service: [name] 01/30/2022 9:00 A M Medical Record Number: [id number] Patient Account Number: [id number] Date of Birth/Sex: Treating RN: 1960-06-17 (61 y.o. [name] Primary Care Ulysee Fyock: Aliene Beams Other Clinician: Referring Larayah Clute: Treating Azka Steger/Extender: Karna Dupes in Treatment: 0 Abuse Risk Screen Items Answer Notes pt non-verbal, sister and Aunt at bedside Electronic Signature(s) Signed: 01/30/2022 4:16:33 PM By: Tommie Ard RN Entered By: Tommie Ard on 01/30/2022 09:21:13 -------------------------------------------------------------------------------- Activities of Daily Living Details Patient Name: Date of Service: [name] 01/30/2022 9:00 A M Medical Record Number: [id number] Patient Account Number: [id number] Date of Birth/Sex: Treating RN: March 06, 1960 (61 y.o. [name] Primary Care Kimba Lottes: Aliene Beams Other Clinician: Referring Sandria Mcenroe: Treating Areesha Dehaven/Extender: Karna Dupes in Treatment: 0 Activities of Daily Living Items Answer Activities of Daily Living (Please select one for each item) Drive Automobile Not Able T Medications ake Not Able Use T elephone Not Able Care for Appearance Not Able Use T oilet Not Able Mady Haagensen / Shower Not Able Dress Self Not Able Feed Self Not Able Walk Not Able Get In / Out Bed Not Able Housework Not Able Prepare Meals Not Able Handle Money Not Able Shop for Self Not Able Electronic Signature(s) Signed: 01/30/2022 4:16:33 PM By: Tommie Ard RN Entered By: Tommie Ard on 01/30/2022 09:21:35 Geanie Kenning ([id number]) [id number] Page 2 of  4 -------------------------------------------------------------------------------- Education Screening Details Patient Name: Date of Service: [name] 01/30/2022 9:00 A M Medical Record Number: [id number] Patient Account Number: [id number] Date of Birth/Sex: Treating RN: 25-Apr-1960 (61 y.o. [name] Primary Care Denney Shein: Aliene Beams Other Clinician: Referring Sirron Francesconi: Treating Lavante Toso/Extender: Karna Dupes in Treatment: 0 Primary Learner Assessed: Caregiver sister Reason Patient is not Primary Learner: non-verbal/ cerebral palsy Learning Preferences/Education Level/Primary Language Learning Preference: Explanation Preferred Language: English Cognitive Barrier Language Barrier: No Translator Needed: No Memory Deficit: No Emotional Barrier: No Cultural/Religious Beliefs Affecting Medical Care: No Physical Barrier Impaired Vision: No Impaired Hearing: No Decreased Hand dexterity: Yes Knowledge/Comprehension Knowledge Level: Low Comprehension Level: Low Ability to understand written instructions: Low Motivation Anxiety Level: Calm Cooperation: Cooperative Willingness to Engage in Self-Management Low Activities: Readiness to Engage in Self-Management Low Activities: Electronic Signature(s) Signed: 01/30/2022 4:16:33 PM By: Tommie Ard RN Entered By: Tommie Ard on 01/30/2022 09:22:41 -------------------------------------------------------------------------------- Fall Risk Assessment Details Patient Name: Date of Service: Brett TES, [name] 01/30/2022 9:00 A M Medical Record Number: [id number] Patient Account Number: [id number] Date of Birth/Sex: Treating RN: [birth year] (61 y.o. [name] Primary Care Brannon Decaire: Aliene Beams Other Clinician: Referring Kaylee Wombles: Treating Grason Brailsford/Extender: Karna Dupes in Treatment: 0 Fall Risk Assessment Items Have you had 2 or more falls in the last 12  monthso 0 No Have you had any fall that resulted in injury in the last 12 monthso 0 No FALLS RISK SCREEN History of falling - immediate or within 3 months 0 No Secondary diagnosis (Do you have 2 or more medical diagnoseso) 0 No Ambulatory aid Palleschi, Anguel L ([id number]) [id number] Page 3 of 4 None/bed rest/wheelchair/nurse 0 Yes Crutches/cane/walker 0 No Furniture 0 No Intravenous therapy Access/Saline/Heparin Lock 0 No Gait/Transferring Normal/ bed rest/ wheelchair 0 Yes Weak (short steps with or without shuffle, stooped but able to lift head while walking, may seek 0 No support  from furniture) Impaired (short steps with shuffle, may have difficulty arising from chair, head down, impaired 0 No balance) Mental Status Oriented to own ability 0 No Electronic Signature(s) Signed: 01/30/2022 4:16:33 PM By: Tommie Ard RN Entered By: Tommie Ard on 01/30/2022 09:23:18 -------------------------------------------------------------------------------- Foot Assessment Details Patient Name: Date of Service: [name], [name] 01/30/2022 9:00 A M Medical Record Number: [id number] Patient Account Number: [id number] Date of Birth/Sex: Treating RN: September 05, 1960 (61 y.o. [name] Primary Care Zienna Ahlin: Aliene Beams Other Clinician: Referring Syair Fricker: Treating Taralee Marcus/Extender: Karna Dupes in Treatment: 0 Foot Assessment Items Site Locations + = Sensation present, - = Sensation absent, C = Callus, U = Ulcer R = Redness, W = Warmth, M = Maceration, PU = Pre-ulcerative lesion F = Fissure, S = Swelling, D = Dryness Assessment Right: Left: Other Deformity: No No Prior Foot Ulcer: No No Prior Amputation: No No Charcot Joint: No No Ambulatory Status: Non-ambulatory Assistance Device: Wheelchair Gait: Surveyor, mining) Signed: 01/30/2022 4:16:33 PM By: Tommie Ard RN Toon, Lamel 01/30/2022 4:16:33 PM By:  Tommie Ard RN SignedElbert Ewings ([id number]) [id number] Page 4 of 4 Entered By: Tommie Ard on 01/30/2022 09:24:07 -------------------------------------------------------------------------------- Nutrition Risk Screening Details Patient Name: Date of Service: Brett TES, [name] 01/30/2022 9:00 A M Medical Record Number: [id number] Patient Account Number: [id number] Date of Birth/Sex: Treating RN: Aug 28, 1960 (61 y.o. [name] Primary Care Kaydance Bowie: Aliene Beams Other Clinician: Referring Alvis Pulcini: Treating Jadis Mika/Extender: Karna Dupes in Treatment: 0 Height (in): 68 Weight (lbs): 115 Body Mass Index (BMI): 17.5 Nutrition Risk Screening Items Score Screening NUTRITION RISK SCREEN: I have an illness or condition that made me change the kind and/or amount of food I eat 0 No I eat fewer than two meals per day 0 No I eat few fruits and vegetables, or milk products 0 No I have three or more drinks of beer, liquor or wine almost every day 0 No I have tooth or mouth problems that make it hard for me to eat 0 No I don't always have enough money to buy the food I need 0 No I eat alone most of the time 0 No I take three or more different prescribed or over-the-counter drugs a day 1 Yes Without wanting to, I have lost or gained 10 pounds in the last six months 0 No I am not always physically able to shop, cook and/or feed myself 0 No Nutrition Protocols Good Risk Protocol 0 No interventions needed Moderate Risk Protocol High Risk Proctocol Risk Level: Good Risk Score: 1 Electronic Signature(s) Signed: 01/30/2022 4:16:33 PM By: Tommie Ard RN Entered By: Tommie Ard on 01/30/2022 09:23:55

## 2022-01-31 NOTE — Progress Notes (Signed)
Brett KenningBATES, Carr Wise ([id number]) [id number] Page 1 of 10 Visit Report for 01/30/2022 Allergy List Details Patient Name: Date of Service: BA Wise, [place]. 01/30/2022 9:00 A M Medical Record Number: [id number] Patient Account Number: [id number] Date of Birth/Sex: Treating RN: 01/15/1961 (61 y.o. [name]) Zochol, Jamie Primary Care Daelyn Pettaway: Aliene BeamsHagler, Rachel Other Clinician: Referring Pheobe Sandiford: Treating Hanifa Antonetti/Extender: Karna Dupesannon, Jennifer Hagler, Rachel Weeks in Treatment: 0 Allergies Active Allergies No Known Allergies Allergy Notes Electronic Signature(s) Signed: 01/30/2022 4:16:33 PM By: Tommie ArdZochol, Jamie RN Entered By: Tommie ArdZochol, Jamie on 01/29/2022 16:41:48 -------------------------------------------------------------------------------- Arrival Information Details Patient Name: Date of Service: [name], RO BERT Wise. 01/30/2022 9:00 A M Medical Record Number: [id number] Patient Account Number: [id number] Date of Birth/Sex: Treating RN: 04/19/1960 (61 y.o. [name]) Zochol, Jamie Primary Care Hortensia Duffin: Aliene BeamsHagler, Rachel Other Clinician: Referring Kenitha Glendinning: Treating Elaijah Munoz/Extender: Karna Dupesannon, Jennifer Hagler, Rachel Weeks in Treatment: 0 Visit Information Patient Arrived: Wheel Chair Arrival Time: 08:50 Accompanied By: Maricela CuretSister, aunt Transfer Assistance: Michiel SitesHoyer Lift Patient Identification Verified: Yes Secondary Verification Process Completed: Yes Patient Requires Transmission-Based Precautions: No Patient Has Alerts: No Electronic Signature(s) Signed: 01/30/2022 4:16:33 PM By: Tommie ArdZochol, Jamie RN Previous Signature: 01/30/2022 8:59:23 AM Version By: Dayton Scrapeoss, Aisha Entered By: Tommie ArdZochol, Jamie on 01/30/2022 15:44:52 -------------------------------------------------------------------------------- Clinic Level of Care Assessment Details Patient Name: Date of Service: BA Wise, RO [name]. 01/30/2022 9:00 A Audelia ActonM Null, [name] ([id number]) [id number] Page 2 of 10 Medical Record  Number: [id number] Patient Account Number: [id number] Date of Birth/Sex: Treating RN: 07/30/1960 (61 y.o. [name]) Zochol, Jamie Primary Care Torin Modica: Aliene BeamsHagler, Rachel Other Clinician: Referring Toniette Devera: Treating Sumayya Muha/Extender: Karna Dupesannon, Jennifer Hagler, Rachel Weeks in Treatment: 0 Clinic Level of Care Assessment Items TOOL 1 Quantity Score X- 1 0 Use when EandM and Procedure is performed on INITIAL visit ASSESSMENTS - Nursing Assessment / Reassessment X- 1 20 General Physical Exam (combine w/ comprehensive assessment (listed just below) when performed on new pt. evals) X- 1 25 Comprehensive Assessment (HX, ROS, Risk Assessments, Wounds Hx, etc.) ASSESSMENTS - Wound and Skin Assessment / Reassessment X- 1 10 Dermatologic / Skin Assessment (not related to wound area) ASSESSMENTS - Ostomy and/or Continence Assessment and Care X- 1 10 Incontinence Assessment and Management []  - 0 Ostomy Care Assessment and Management (repouching, etc.) PROCESS - Coordination of Care X - Simple Patient / Family Education for ongoing care 1 15 []  - 0 Complex (extensive) Patient / Family Education for ongoing care X- 1 10 Staff obtains ChiropractorConsents, Records, T Results / Process Orders est X- 1 10 Staff telephones HHA, Nursing Homes / Clarify orders / etc []  - 0 Routine Transfer to another Facility (non-emergent condition) []  - 0 Routine Hospital Admission (non-emergent condition) []  - 0 New Admissions / Manufacturing engineernsurance Authorizations / Ordering NPWT Apligraf, etc. , []  - 0 Emergency Hospital Admission (emergent condition) PROCESS - Special Needs []  - 0 Pediatric / Minor Patient Management []  - 0 Isolation Patient Management []  - 0 Hearing / Language / Visual special needs []  - 0 Assessment of Community assistance (transportation, D/C planning, etc.) []  - 0 Additional assistance / Altered mentation []  - 0 Support Surface(s) Assessment (bed, cushion, seat, etc.) INTERVENTIONS - Miscellaneous []  -  0 External ear exam X- 1 10 Patient Transfer (multiple staff / Nurse, adultHoyer Lift / Similar devices) []  - 0 Simple Staple / Suture removal (25 or less) []  - 0 Complex Staple / Suture removal (26 or more) []  - 0 Hypo/Hyperglycemic Management (do not check if billed separately) []  - 0 Ankle / Brachial Index (ABI) -  do not check if billed separately Has the patient been seen at the hospital within the last three years: Yes Total Score: 110 Level Of Care: New/Established - Level 3 Electronic Signature(s) Signed: 01/30/2022 4:16:33 PM By: Tommie Ard RN Entered By: Tommie Ard on 01/30/2022 10:02:32 [name] ([id number]) [id number] Page 3 of 10 -------------------------------------------------------------------------------- Encounter Discharge Information Details Patient Name: Date of Service: BA Wise, [place] 01/30/2022 9:00 A M Medical Record Number: [id number] Patient Account Number: [id number] Date of Birth/Sex: Treating RN: 10-10-1960 (61 y.o. [name] Primary Care Layman Gully: Aliene Beams Other Clinician: Referring Asbury Hair: Treating Yanique Mulvihill/Extender: Karna Dupes in Treatment: 0 Encounter Discharge Information Items Post Procedure Vitals Discharge Condition: Stable Temperature (F): 97.5 Ambulatory Status: Wheelchair Pulse (bpm): 93 Discharge Destination: Home Respiratory Rate (breaths/min): 20 Transportation: Private Auto Blood Pressure (mmHg): 103/69 Accompanied By: sister, aunt Schedule Follow-up Appointment: Yes Clinical Summary of Care: Electronic Signature(s) Signed: 01/30/2022 4:16:33 PM By: Tommie Ard RN Entered By: Tommie Ard on 01/30/2022 15:47:17 -------------------------------------------------------------------------------- Lower Extremity Assessment Details Patient Name: Date of Service: BA Wise, RO BERT Wise. 01/30/2022 9:00 A M Medical Record Number: [id number] Patient Account Number:  [id number] Date of Birth/Sex: Treating RN: [birth year] (61 y.o. [name] Primary Care Emmilia Sowder: Aliene Beams Other Clinician: Referring Kathalina Ostermann: Treating Lenwood Balsam/Extender: Karna Dupes in Treatment: 0 Electronic Signature(s) Signed: 01/30/2022 4:16:33 PM By: Tommie Ard RN Entered By: Tommie Ard on 01/30/2022 09:24:15 -------------------------------------------------------------------------------- Multi Wound Chart Details Patient Name: Date of Service: [name], RO BERT Wise. 01/30/2022 9:00 A M Medical Record Number: [id number] Patient Account Number: [id number] Date of Birth/Sex: Treating RN: 1960/03/20 (61 y.o. M) Primary Care Yosselin Zoeller: Aliene Beams Other Clinician: Referring Fredick Schlosser: Treating Arshia Rondon/Extender: Karna Dupes in Treatment: 0 Vital Signs Height(in): 68 Pulse(bpm): 93 Weight(lbs): 115 Blood Pressure(mmHg): 103/69 Body Mass Index(BMI): 17.5 Temperature(F): 97.5 Respiratory Rate(breaths/min): 20 Krupka, Kartik Wise ([id number]) [id number] Page 4 of 10 [1:Photos:] [N/A:N/A] Coccyx Left Ischial Tuberosity N/A Wound Location: Pressure Injury Pressure Injury N/A Wounding Event: Acute Osteomyelitis Acute Osteomyelitis N/A Primary Etiology: 11/30/2021 11/30/2021 N/A Date Acquired: 0 0 N/A Weeks of Treatment: Open Open N/A Wound Status: No No N/A Wound Recurrence: 2.8x1.5x0.6 6x4.5x1.5 N/A Measurements Wise x W x D (cm) 3.299 21.206 N/A A (cm) : rea 1.979 31.809 N/A Volume (cm) : 12 Position 1 (o'clock): 1.5 Maximum Distance 1 (cm): 9 Position 2 (o'clock): 1 Maximum Distance 2 (cm): No Yes N/A Tunneling: Full Thickness Without Exposed Full Thickness Without Exposed N/A Classification: Support Structures Support Structures Medium Medium N/A Exudate Amount: Serous Serous N/A Exudate Type: Media planner N/A Exudate Color: Medium (34-66%) N/A N/A Granulation  Amount: Pink N/A N/A Granulation Quality: Medium (34-66%) N/A N/A Necrotic Amount: Fat Layer (Subcutaneous Tissue): Yes Fat Layer (Subcutaneous Tissue): Yes N/A Exposed Structures: Fascia: No Fascia: No Tendon: No Tendon: No Joint: No Muscle: No Bone: No Joint: No Bone: No Debridement - Selective/Open Wound Debridement - Excisional N/A Debridement: Pre-procedure Verification/Time Out 09:50 09:50 N/A Taken: Lidocaine 5% topical ointment Lidocaine 5% topical ointment N/A Pain Control: Slough Subcutaneous, Slough N/A Tissue Debrided: Non-Viable Tissue Skin/Subcutaneous Tissue N/A Level: 4.2 27 N/A Debridement A (sq cm): rea Curette Curette N/A Instrument: Minimum Minimum N/A Bleeding: Pressure Pressure N/A Hemostasis A chieved: Procedure was tolerated well Procedure was tolerated well N/A Debridement Treatment Response: 2.8x1.5x0.6 6x4.5x1.5 N/A Post Debridement Measurements Wise x W x D (cm) 1.979 31.809 N/A Post Debridement Volume: (cm) Rash: Yes Excoriation: Yes N/A Periwound Skin  Texture: Dry/Scaly: Yes N/A Periwound Skin Moisture: No Abnormalities Noted Erythema: Yes N/A Periwound Skin Color: No Abnormality No Abnormality N/A Temperature: Debridement Debridement N/A Procedures Performed: Treatment Notes Electronic Signature(s) Signed: 01/30/2022 10:04:57 AM By: Duanne Guess MD FACS Entered By: Duanne Guess on 01/30/2022 10:04:57 -------------------------------------------------------------------------------- Multi-Disciplinary Care Plan Details Patient Name: Date of Service: [name], RO BERT Wise. 01/30/2022 9:00 A M Medical Record Number: [id number] Patient Account Number: [id number] Date of Birth/Sex: Treating RN: [birth year] (61 y.o. [name], [name], [name] ([id number]) [id number] Page 5 of 10 Primary Care Payam Gribble: Aliene Beams Other Clinician: Referring Roxanne Orner: Treating Marisah Laker/Extender: Karna Dupes in Treatment: 0 Active Inactive Orientation to the Wound Care Program Nursing Diagnoses: Knowledge deficit related to the wound healing center program Goals: Patient/caregiver will verbalize understanding of the Wound Healing Center Program Date Initiated: 01/30/2022 Target Resolution Date: 04/02/2022 Goal Status: Active Interventions: Provide education on orientation to the wound center Notes: Osteomyelitis Nursing Diagnoses: Infection: osteomyelitis Goals: Patient/caregiver will verbalize understanding of disease process and disease management Date Initiated: 01/30/2022 Target Resolution Date: 04/03/2022 Goal Status: Active Interventions: Assess for signs and symptoms of osteomyelitis resolution every visit Provide education on osteomyelitis Treatment Activities: MRI : 01/20/2022 Surgical debridement : 01/30/2022 Notes: Wound/Skin Impairment Nursing Diagnoses: Impaired tissue integrity Goals: Ulcer/skin breakdown will have a volume reduction of 30% by week 4 Date Initiated: 01/30/2022 Target Resolution Date: 03/03/2022 Goal Status: Active Interventions: Assess patient/caregiver ability to obtain necessary supplies Assess ulceration(s) every visit Treatment Activities: Skin care regimen initiated : 01/30/2022 Notes: Electronic Signature(s) Signed: 01/30/2022 4:16:33 PM By: Tommie Ard RN Entered By: Tommie Ard on 01/30/2022 09:57:36 -------------------------------------------------------------------------------- Pain Assessment Details Patient Name: Date of Service: [name], RO BERT Wise. 01/30/2022 9:00 A KAHMARI, KOLLER ([id number]) [id number] Page 6 of 10 Medical Record Number: [id number] Patient Account Number: [id number] Date of Birth/Sex: Treating RN: 26-Apr-1960 (61 y.o. [name] Primary Care Kennedi Lizardo: Aliene Beams Other Clinician: Referring Twanna Resh: Treating Aikeem Lilley/Extender: Karna Dupes in Treatment: 0 Active Problems Location of Pain Severity and Description of Pain Patient Has Paino No Site Locations Rate the pain. Current Pain Level: 0 Pain Management and Medication Current Pain Management: Electronic Signature(s) Signed: 01/30/2022 4:16:33 PM By: Tommie Ard RN Entered By: Tommie Ard on 01/30/2022 09:40:17 -------------------------------------------------------------------------------- Patient/Caregiver Education Details Patient Name: Date of Service: [name], RO BERT Elbert Ewings 12/7/2023andnbsp9:00 A M Medical Record Number: [id number] Patient Account Number: [id number] Date of Birth/Gender: Treating RN: May 20, 1960 (61 y.o. [name] Primary Care Physician: Aliene Beams Other Clinician: Referring Physician: Treating Physician/Extender: Karna Dupes in Treatment: 0 Education Assessment Education Provided To: Patient Education Topics Provided Infection: Handouts: Infection Prevention and Management Methods: Explain/Verbal Responses: Reinforcements needed, State content correctly Welcome T [facility]: o Handouts: Welcome T [facility] o Methods: Explain/Verbal Responses: Reinforcements needed, State content correctly Wound Debridement: Handouts: Wound Debridement Methods: Explain/Verbal [name], [name] ([id number]) [id number] Page 7 of 10 Responses: Reinforcements needed, State content correctly Electronic Signature(s) Signed: 01/30/2022 4:16:33 PM By: Tommie Ard RN Entered By: Tommie Ard on 01/30/2022 09:58:37 -------------------------------------------------------------------------------- Wound Assessment Details Patient Name: Date of Service: [name], RO BERT Wise. 01/30/2022 9:00 A M Medical Record Number: [id number] Patient Account Number: [id number] Date of Birth/Sex: Treating RN: October 11, 1960 (60 y.o. [name] Primary Care  Anetta Olvera: Aliene Beams Other Clinician: Referring Geral Tuch: Treating Dameer Speiser/Extender: Karna Dupes in Treatment: 0 Wound Status Wound Number: 1 Primary Etiology:  Acute Osteomyelitis Wound Location: Coccyx Wound Status: Open Wounding Event: Pressure Injury Date Acquired: 11/30/2021 Weeks Of Treatment: 0 Clustered Wound: No Photos Wound Measurements Length: (cm) 2.8 Width: (cm) 1.5 Depth: (cm) 0.6 Area: (cm) 3.299 Volume: (cm) 1.979 % Reduction in Area: % Reduction in Volume: Tunneling: No Undermining: No Wound Description Classification: Full Thickness Without Exposed Suppor Exudate Amount: Medium Exudate Type: Serous Exudate Color: amber t Structures Foul Odor After Cleansing: No Wound Bed Granulation Amount: Medium (34-66%) Exposed Structure Granulation Quality: Pink Fascia Exposed: No Necrotic Amount: Medium (34-66%) Fat Layer (Subcutaneous Tissue) Exposed: Yes Necrotic Quality: Adherent Slough Tendon Exposed: No Joint Exposed: No Bone Exposed: No Periwound Skin Texture Texture Color No Abnormalities Noted: No No Abnormalities Noted: Yes Rash: Yes Temperature / Pain Temperature: No Abnormality Moisture No Abnormalities Noted: No Lederman, Bejamin Wise ([id number]) [id number] Page 8 of 10 Dry / Scaly: Yes Treatment Notes Wound #1 (Coccyx) Cleanser Soap and Water Discharge Instruction: May shower and wash wound with dial antibacterial soap and water prior to dressing change. Peri-Wound Care Skin Prep Discharge Instruction: Use skin prep as directed Zinc Oxide Ointment 30g tube Discharge Instruction: Apply Zinc Oxide to periwound with each dressing change Topical Primary Dressing MediHoney Gel, tube 1.5 (oz) Discharge Instruction: Apply to wound bed as instructed Secondary Dressing ABD Pad, 8x10 Discharge Instruction: Apply over primary dressing as directed. Zetuvit Plus Silicone Border Sacrum  Dressing, Sm, 7x7 (in/in) Discharge Instruction: Apply silicone border over primary dressing as directed. Secured With Compression Wrap Compression Stockings Facilities manager) Signed: 01/30/2022 4:16:33 PM By: Tommie Ard RN Entered By: Tommie Ard on 01/30/2022 09:39:32 -------------------------------------------------------------------------------- Wound Assessment Details Patient Name: Date of Service: [name], RO BERT Wise. 01/30/2022 9:00 A M Medical Record Number: [id number] Patient Account Number: [id number] Date of Birth/Sex: Treating RN: [birth year] (61 y.o. [name] Primary Care Chenille Toor: Aliene Beams Other Clinician: Referring Annisa Mazzarella: Treating Marializ Ferrebee/Extender: Karna Dupes in Treatment: 0 Wound Status Wound Number: 2 Primary Etiology: Acute Osteomyelitis Wound Location: Left Ischial Tuberosity Wound Status: Open Wounding Event: Pressure Injury Date Acquired: 11/30/2021 Weeks Of Treatment: 0 Clustered Wound: No Photos CALHOUN, REICHARDT ([id number]) [id number] Page 9 of 10 Wound Measurements Length: (cm) 6 Width: (cm) 4.5 Depth: (cm) 1.5 Area: (cm) 21.206 Volume: (cm) 31.809 % Reduction in Area: % Reduction in Volume: Tunneling: Yes Location 1 Position (o'clock): 12 Maximum Distance: (cm) 1.5 Location 2 Position (o'clock): 9 Maximum Distance: (cm) 1 Undermining: No Wound Description Classification: Full Thickness Without Exposed Suppor Exudate Amount: Medium Exudate Type: Serous Exudate Color: amber t Structures Foul Odor After Cleansing: No Slough/Fibrino Yes Wound Bed Exposed Structure Fascia Exposed: No Fat Layer (Subcutaneous Tissue) Exposed: Yes Tendon Exposed: No Muscle Exposed: No Joint Exposed: No Bone Exposed: No Periwound Skin Texture Texture Color No Abnormalities Noted: No No Abnormalities Noted: No Excoriation: Yes Erythema: Yes Moisture Temperature /  Pain No Abnormalities Noted: No Temperature: No Abnormality Treatment Notes Wound #2 (Ischial Tuberosity) Wound Laterality: Left Cleanser Soap and Water Discharge Instruction: May shower and wash wound with dial antibacterial soap and water prior to dressing change. Peri-Wound Care Skin Prep Discharge Instruction: Use skin prep as directed Zinc Oxide Ointment 30g tube Discharge Instruction: Apply Zinc Oxide to periwound with each dressing change Topical Primary Dressing MediHoney Gel, tube 1.5 (oz) Discharge Instruction: Apply to wound bed as instructed Secondary Dressing ABD Pad, 8x10 Discharge Instruction: Apply over primary dressing as directed. Secured With TAISHAWN, SMALDONE ([id number]) [id number] Page 10 of 10 69M  Medipore Soft Cloth Surgical T 2x10 (in/yd) ape Discharge Instruction: Secure with tape as directed. Compression Wrap Compression Stockings Add-Ons Electronic Signature(s) Signed: 01/30/2022 4:16:33 PM By: Tommie Ard RN Entered By: Tommie Ard on 01/30/2022 09:39:56 -------------------------------------------------------------------------------- Vitals Details Patient Name: Date of Service: BA Wise, RO BERT Wise. 01/30/2022 9:00 A M Medical Record Number: [id number] Patient Account Number: [id number] Date of Birth/Sex: Treating RN: 1961-01-18 (61 y.o. M) Primary Care Neilan Rizzo: Aliene Beams Other Clinician: Referring Weldon Nouri: Treating Malaky Tetrault/Extender: Karna Dupes in Treatment: 0 Vital Signs Time Taken: 08:50 Temperature (F): 97.5 Height (in): 68 Pulse (bpm): 93 Weight (lbs): 115 Respiratory Rate (breaths/min): 20 Body Mass Index (BMI): 17.5 Blood Pressure (mmHg): 103/69 Reference Range: 80 - 120 mg / dl Electronic Signature(s) Signed: 01/30/2022 8:59:23 AM By: Dayton Scrape Entered By: Dayton Scrape on 01/30/2022 08:52:49

## 2022-02-03 ENCOUNTER — Ambulatory Visit: Payer: Medicare Other | Admitting: Internal Medicine

## 2022-02-03 ENCOUNTER — Other Ambulatory Visit: Payer: Self-pay

## 2022-02-03 ENCOUNTER — Encounter: Payer: Self-pay | Admitting: Internal Medicine

## 2022-02-03 VITALS — BP 88/63 | HR 86 | Temp 98.2°F

## 2022-02-03 DIAGNOSIS — L089 Local infection of the skin and subcutaneous tissue, unspecified: Secondary | ICD-10-CM

## 2022-02-03 DIAGNOSIS — M4628 Osteomyelitis of vertebra, sacral and sacrococcygeal region: Secondary | ICD-10-CM | POA: Diagnosis not present

## 2022-02-03 DIAGNOSIS — M8660 Other chronic osteomyelitis, unspecified site: Secondary | ICD-10-CM

## 2022-02-03 DIAGNOSIS — L89324 Pressure ulcer of left buttock, stage 4: Secondary | ICD-10-CM | POA: Diagnosis not present

## 2022-02-03 DIAGNOSIS — B964 Proteus (mirabilis) (morganii) as the cause of diseases classified elsewhere: Secondary | ICD-10-CM | POA: Diagnosis not present

## 2022-02-03 DIAGNOSIS — L89150 Pressure ulcer of sacral region, unstageable: Secondary | ICD-10-CM | POA: Diagnosis not present

## 2022-02-03 DIAGNOSIS — L89213 Pressure ulcer of right hip, stage 3: Secondary | ICD-10-CM

## 2022-02-03 DIAGNOSIS — T148XXA Other injury of unspecified body region, initial encounter: Secondary | ICD-10-CM

## 2022-02-03 NOTE — Progress Notes (Signed)
[facility]  CHIEF COMPLAINT:    Follow up for sacral OM  SUBJECTIVE:    [name] is a 61 y.o. male with PMHx as below who presents to the clinic for osteomyelitis.   Patient was admitted at [facility] from 01/04/22-01/13/22 with worsening sacral and ischial decubitus ulcer with osteomyelitis.  He was evaluated by surgery whom did not recommend debridement.  Superficial cultures were done in the ED that grew several different organisms all of unclear significance in the setting of just being a superficial swab.  Blood cultures were negative. He received hydrotherapy in the hospital.    He was discharged on daptomycin, ceftriaxone, and MTZ x 2 weeks through 01/20/22 and then started Augmentin and Doxycycline x 4 more weeks to treat his sacral infection through 02/18/22.  He had a wound care appointment 01/30/22 where coccyx wound and deep left ischial tuberosity wound were noted.  Wound care felt that he would probably benefit from a diverting ostomy as well as suprapubic catheter given the perpetual soilage of the wound.  His sister today reports [name] is doing well and having no issues with the antibiotics.  His appetite has been good and he has been afebrile.  They think his wounds are continuing to improve.   Please see A&P for the details of today's visit and status of the patient's medical problems.   Patient's Medications  New Prescriptions   No medications on file  Previous Medications   ACETAMINOPHEN (TYLENOL) 325 MG TABLET    Take 325 mg by mouth every 6 (six) hours as needed for moderate pain or headache.   AMOXICILLIN-CLAVULANATE (AUGMENTIN) 875-125 MG TABLET    Take 1 tablet by mouth 2 (two) times daily for 28 days.   BISACODYL (DULCOLAX) 10 MG SUPPOSITORY    Place 1 suppository (10 mg total) rectally daily as needed for moderate constipation.   DOCUSATE SODIUM (COLACE) 100 MG CAPSULE    Take 1 capsule (100 mg total) by mouth  2 (two) times daily.   DOXYCYCLINE (VIBRAMYCIN) 100 MG CAPSULE    Take 1 capsule (100 mg total) by mouth 2 (two) times daily for 28 days.   ENSURE MAX PROTEIN (ENSURE MAX PROTEIN) LIQD    Take 330 mLs (11 oz total) by mouth daily.   LEPTOSPERMUM MANUKA HONEY (MEDIHONEY) PSTE PASTE    Apply 1 Application topically daily.   MAGNESIUM HYDROXIDE (MILK OF MAGNESIA) 400 MG/5ML SUSPENSION    Take 15 mLs by mouth daily as needed for mild constipation.   MULTIPLE VITAMIN (MULTIVITAMIN WITH MINERALS) TABS TABLET    Take 1 tablet by mouth daily.   NUTRITION SUPPLEMENT, JUVEN, (JUVEN) PACK    Take 1 packet by mouth 2 (two) times daily between meals.   POLYETHYLENE GLYCOL (MIRALAX / GLYCOLAX) 17 G PACKET    Take 17 g by mouth daily.   SILODOSIN (RAPAFLO) 8 MG CAPS CAPSULE    Take 1 capsule (8 mg total) by mouth daily with breakfast.   SIMETHICONE (MYLICON) 80 MG CHEWABLE TABLET    Chew 1 tablet (80 mg total) by mouth 4 (four) times daily.  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   PONV (postoperative nausea and vomiting)     Social History   Tobacco Use   Smoking status: Never   Smokeless tobacco: Never  Vaping Use   Vaping Use: Never used  Substance Use Topics   Alcohol use: No   Drug use: No    Family History  Problem Relation Age of Onset   Diabetes Mother    Heart failure Mother    Hypertension Mother    Diabetes Father    Heart failure Father    Hypertension Father    Cancer Father     No Known Allergies  Review of Systems  Unable to perform ROS: Patient nonverbal     OBJECTIVE:    Vitals:   02/03/22 1045  BP: (!) 88/63  Pulse: 86  Temp: 98.2 F (36.8 C)  TempSrc: Temporal  SpO2: 96%    There is no height or weight on file to calculate BMI.  Physical Exam Constitutional:      Appearance: Normal appearance.  HENT:     Head: Normocephalic and atraumatic.  Eyes:     Extraocular Movements: Extraocular movements intact.     Conjunctiva/sclera: Conjunctivae normal.  Skin:    General: Skin is warm and dry.  Neurological:     General: No focal deficit present.     Mental Status: He is alert. Mental status is at baseline.      Labs and Microbiology:    Latest Ref Rng & Units 01/09/2022    4:19 AM 01/08/2022    4:03 AM 01/07/2022    4:22 AM  CBC  WBC 4.0 - 10.5 K/uL 8.2  7.6  8.0   Hemoglobin 13.0 - 17.0 g/dL 69.6  29.5  9.7   Hematocrit 39.0 - 52.0 % 37.6  35.5  32.3   Platelets 150 - 400 K/uL 426  441  412       Latest Ref Rng & Units 01/12/2022   10:27 AM 01/09/2022    4:19 AM 01/08/2022    4:03 AM  CMP  Glucose 70 - 99 mg/dL 99  284  132   BUN 6 - 20 mg/dL 46  43  26   Creatinine 0.61 - 1.24 mg/dL 4.40  1.02  7.25   Sodium 135 - 145 mmol/L 137  137  138   Potassium 3.5 - 5.1 mmol/L 4.3  4.0  4.1   Chloride 98 - 111 mmol/L 102  106  106   CO2 22 - 32 mmol/L 28  23  24    Calcium 8.9 - 10.3 mg/dL 8.8  8.8  8.4        ASSESSMENT & PLAN:    Osteomyelitis (HCC) Patient will continue 6 week course of antibiotics and is now on Augmentin 875 PO BID and Doxycycline 100mg  PO BID as planned through 02/18/22 after completing 2 weeks of IV lead in treatment.  He has established with wound care as planned and saw them last week.  Discussed with patient and sister that antibiotics with this type of infection are only part of the treatment plan and may require approach of debridement, off loading, wound care, diversion, possible flap coverage in the future.  Follow up as needed with ID for now.  In the future, if he has new signs of infection at these wounds would focus on duration for soft tissue process as he has now completed an extended course for deeper bone  infection.  Vedia Coffer for Infectious Disease [facility] 02/03/2022, 11:06 AM   I have personally spent 30 minutes involved in face-to-face and non-face-to-face activities for this patient on the day of the visit. Professional time spent includes the following activities: Preparing to see the patient (review of tests), Obtaining and/or reviewing separately obtained history (admission/discharge record), Performing a medically appropriate examination and/or evaluation , Ordering medications/tests/procedures, referring and communicating with other health care professionals, Documenting clinical information in the EMR, Independently interpreting results (not separately reported), Communicating results to the patient/family/caregiver, Counseling and educating the patient/family/caregiver and Care coordination (not separately reported).

## 2022-02-03 NOTE — Assessment & Plan Note (Addendum)
Patient will continue 6 week course of antibiotics and is now on Augmentin 875 PO BID and Doxycycline 100mg  PO BID as planned through 02/18/22 after completing 2 weeks of IV lead in treatment.  He has established with wound care as planned and saw them last week.  Discussed with patient and sister that antibiotics with this type of infection are only part of the treatment plan and may require approach of debridement, off loading, wound care, diversion, possible flap coverage in the future.  Follow up as needed with ID for now.  In the future, if he has new signs of infection at these wounds would focus on duration for soft tissue process as he has now completed an extended course for deeper bone infection.

## 2022-02-04 DIAGNOSIS — L89154 Pressure ulcer of sacral region, stage 4: Secondary | ICD-10-CM | POA: Diagnosis not present

## 2022-02-05 ENCOUNTER — Ambulatory Visit: Payer: Medicare Other | Admitting: Urology

## 2022-02-05 VITALS — BP 101/68 | HR 94

## 2022-02-05 DIAGNOSIS — R339 Retention of urine, unspecified: Secondary | ICD-10-CM

## 2022-02-05 NOTE — Progress Notes (Signed)
02/05/2022 10:16 AM   [name] [birth year] [id number]  Referring provider: Aliene Beams, MD [id number] [place],  [place] [address]  Followup urinary retention   HPI: [name] is a 61yo here for followup for urinary retention. He is doing well with the indwelling foley. No UTI since last visit. No hematuria. 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   PONV (postoperative nausea and vomiting)     Surgical History: Past Surgical History:  Procedure Laterality Date   COLONOSCOPY WITH PROPOFOL N/A 08/26/2020   Procedure: COLONOSCOPY WITH PROPOFOL;  Surgeon: Vida Rigger, MD;  Location: [facility];  Service: Endoscopy;  Laterality: N/A;   ESOPHAGOGASTRODUODENOSCOPY (EGD) WITH PROPOFOL N/A 08/26/2020   Procedure: ESOPHAGOGASTRODUODENOSCOPY (EGD) WITH PROPOFOL;  Surgeon: Vida Rigger, MD;  Location: [facility];  Service: Endoscopy;  Laterality: N/A;   IR NEPHROSTOMY PLACEMENT RIGHT  01/03/2019   ROBOT ASSISTED LAPAROSCOPIC NEPHRECTOMY Right 02/24/2019   Procedure: XI ROBOTIC ASSISTED LAPAROSCOPIC NEPHRECTOMY;  Surgeon: Malen Gauze, MD;  Location: [facility];  Service: Urology;  Laterality: Right;  3 HRS   TENDON RELEASE      Home Medications:  Allergies as of 02/05/2022   No Known Allergies      Medication List        Accurate as of February 05, 2022 10:16 AM. If you have any questions, ask your nurse or doctor.          acetaminophen 325 MG tablet Commonly known as: TYLENOL Take 325 mg by mouth every 6 (six) hours as needed for moderate pain or headache.   amoxicillin-clavulanate 875-125 MG tablet Commonly known as: AUGMENTIN Take 1 tablet by mouth 2 (two) times daily for 28 days.   bisacodyl 10 MG suppository Commonly known as: DULCOLAX Place  1 suppository (10 mg total) rectally daily as needed for moderate constipation.   docusate sodium 100 MG capsule Commonly known as: Colace Take 1 capsule (100 mg total) by mouth 2 (two) times daily.   doxycycline 100 MG capsule Commonly known as: Vibramycin Take 1 capsule (100 mg total) by mouth 2 (two) times daily for 28 days.   leptospermum manuka honey Pste paste Apply 1 Application topically daily.   magnesium hydroxide 400 MG/5ML suspension Commonly known as: MILK OF MAGNESIA Take 15 mLs by mouth daily as needed for mild constipation.   multivitamin with minerals Tabs tablet Take 1 tablet by mouth daily.   nutrition supplement (JUVEN) Pack Take 1 packet by mouth 2 (two) times daily between meals.   Ensure Max Protein Liqd Take 330 mLs (11 oz total) by mouth daily.   polyethylene glycol 17 g packet Commonly known as: MIRALAX / GLYCOLAX Take 17 g by mouth daily.   silodosin 8 MG Caps capsule Commonly known as: RAPAFLO Take 1 capsule (8 mg total) by mouth daily with breakfast.   simethicone 80 MG chewable tablet Commonly known as: MYLICON Chew 1 tablet (80 mg total) by mouth 4 (four) times daily.        Allergies: No Known Allergies  Family History: Family History  Problem Relation Age of Onset   Diabetes Mother    Heart failure Mother    Hypertension Mother    Diabetes Father    Heart  failure Father    Hypertension Father    Cancer Father     Social History:  reports that he has never smoked. He has never used smokeless tobacco. He reports that he does not drink alcohol and does not use drugs.  ROS: All other review of systems were reviewed and are negative except what is noted above in HPI  Physical Exam: BP 101/68   Pulse 94   Constitutional:  Alert and oriented, No acute distress. HEENT: [place] AT, moist mucus membranes.  Trachea midline, no masses. Cardiovascular: No clubbing, cyanosis, or edema. Respiratory: Normal respiratory effort, no  increased work of breathing. GI: Abdomen is soft, nontender, nondistended, no abdominal masses GU: No CVA tenderness.  Lymph: No cervical or inguinal lymphadenopathy. Skin: No rashes, bruises or suspicious lesions. Neurologic: Grossly intact, no focal deficits, moving all 4 extremities. Psychiatric: Normal mood and affect.  Laboratory Data: Lab Results  Component Value Date   WBC 8.2 01/09/2022   HGB 11.0 (L) 01/09/2022   HCT 37.6 (L) 01/09/2022   MCV 90.8 01/09/2022   PLT 426 (H) 01/09/2022    Lab Results  Component Value Date   CREATININE 0.89 01/12/2022    No results found for: "PSA"  No results found for: "TESTOSTERONE"  No results found for: "HGBA1C"  Urinalysis    Component Value Date/Time   COLORURINE AMBER (A) 01/04/2022 1955   APPEARANCEUR CLOUDY (A) 01/04/2022 1955   APPEARANCEUR Cloudy (A) 12/18/2021 1504   LABSPEC 1.013 01/04/2022 1955   PHURINE 9.0 (H) 01/04/2022 1955   GLUCOSEU NEGATIVE 01/04/2022 1955   HGBUR NEGATIVE 01/04/2022 1955   BILIRUBINUR NEGATIVE 01/04/2022 1955   BILIRUBINUR Negative 12/18/2021 1504   KETONESUR 5 (A) 01/04/2022 1955   PROTEINUR >=300 (A) 01/04/2022 1955   UROBILINOGEN 0.2 11/24/2014 1630   NITRITE POSITIVE (A) 01/04/2022 1955   LEUKOCYTESUR LARGE (A) 01/04/2022 1955    Lab Results  Component Value Date   LABMICR See below: 12/18/2021   WBCUA >30 (A) 12/18/2021   LABEPIT 0-10 12/18/2021   BACTERIA RARE (A) 01/04/2022    Pertinent Imaging:  Results for orders placed during the hospital encounter of 02/24/19  DG Abd 1 View  Narrative CLINICAL DATA:  Abdominal distension.  EXAM: ABDOMEN - 1 VIEW  COMPARISON:  CT dated 12/02/2018  FINDINGS: There is gaseous distention of loops of small bowel and colon scattered throughout the abdomen. There is a drainage catheter projecting over the patient's right lower quadrant and midline abdomen. There is a large amount of stool at the level of  the rectum.  IMPRESSION: 1. There are gaseous distended loops of small bowel and colon scattered throughout the abdomen. 2. Large amount of stool at the level of the rectum suggesting fecal impaction. 3. A drainage catheter projects over the patient's low abdomen. Correlation with history is recommended.   Electronically Signed By: Katherine Mantle M.D. On: 02/25/2019 21:38  No results found for this or any previous visit.  No results found for this or any previous visit.  No results found for this or any previous visit.  Results for orders placed during the hospital encounter of 11/06/21  Ultrasound renal complete  Narrative CLINICAL DATA:  61 year old male for follow-up of LEFT hydronephrosis. History of RIGHT nephrectomy.  EXAM: RENAL / URINARY TRACT ULTRASOUND COMPLETE  COMPARISON:  10/07/2021 CT and prior studies  FINDINGS: Right Kidney:  Not visualized compatible with nephrectomy.  Left Kidney:  Renal measurements: 11.3 x 5.3 x 4.7 cm. = volume: 146  mL. Echogenicity within normal limits. No mass or hydronephrosis visualized.  Bladder:  A Foley catheter is present in the region of the bladder.  Other:  None.  IMPRESSION: 1. Interval resolution of LEFT hydronephrosis since 10/07/2021 CT. No LEFT renal abnormalities identified. 2. Status post RIGHT nephrectomy. 3. Foley catheter.   Electronically Signed By: Harmon Pier M.D. On: 11/08/2021 10:10  No valid procedures specified. No results found for this or any previous visit.  Results for orders placed in visit on 10/07/21  CT RENAL STONE STUDY  Narrative CLINICAL DATA:  61 year old male presents to the emergency department with right leg pain. Has history of right-sided nephrolithiasis status post right nephrectomy  EXAM: CT ABDOMEN AND PELVIS WITHOUT CONTRAST  TECHNIQUE: Multidetector CT imaging of the abdomen and pelvis was performed following the standard protocol without IV  contrast.  RADIATION DOSE REDUCTION: This exam was performed according to the departmental dose-optimization program which includes automated exposure control, adjustment of the mA and/or kV according to patient size and/or use of iterative reconstruction technique.  COMPARISON:  September 27, 2021, December 02, 2018  FINDINGS: Lower chest: Minimal dependent atelectasis.  No acute abnormality.  Hepatobiliary: No focal liver abnormality is seen. No gallstones, gallbladder wall thickening, or biliary dilatation.  Pancreas: Unremarkable. No pancreatic ductal dilatation or surrounding inflammatory changes.  Spleen: Normal in size without focal abnormality.  Adrenals/Urinary Tract: The adrenal glands are unremarkable. Status post right total nephrectomy. Moderate to severe left hydroureteronephrosis. Severe distention of the urinary bladder. No radiopaque nephrolithiasis.  Stomach/Bowel: Stomach is within normal limits. Large rectal stool ball. No evidence of bowel wall thickening or inflammatory changes.  Vascular/Lymphatic: No significant vascular findings are present. No enlarged abdominal or pelvic lymph nodes.  Reproductive: Coarse calcifications in the prostate gland.  Other: No abdominal wall hernia or abnormality. No abdominopelvic ascites.  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 No acute or significant osseous findings.  IMPRESSION: 1. Findings suggestive of bladder outlet obstruction including marked distention of the bladder and moderate to severe left hydroureteronephrosis. No nephrolithiasis. 2. Large rectal stool ball.   Electronically Signed By: Jacob Moores M.D. On: 10/07/2021 13:23   Assessment & Plan:    1. Urinary retention -schedule for SP tube change -followup 4-6 weeks   No follow-ups on file.  [name], MD  [facility] [place]

## 2022-02-05 NOTE — Progress Notes (Signed)
Cath Change/ Replacement  Patient is present today for a catheter change due to urinary retention.  10 ml of water was removed from the balloon, a 16 FR foley cath was removed without difficulty.  Patient was cleaned and prepped in a sterile fashion with betadine and 2% lidocaine jelly was instilled into the urethra. A 16 FR foley cath was replaced into the bladder, no complications were noted. Urine return was noted 250 ml and urine was yellow in color. The balloon was filled with 46ml of sterile water. A night bag was attached for drainage.  A night bag was also given to the patient and patient was given instruction on how to change from one bag to another. Patient was given proper instruction on catheter care.    Performed by: [id number] CMA  Follow up: Follow up as scheduled

## 2022-02-06 DIAGNOSIS — G809 Cerebral palsy, unspecified: Secondary | ICD-10-CM | POA: Diagnosis not present

## 2022-02-06 DIAGNOSIS — L89212 Pressure ulcer of right hip, stage 2: Secondary | ICD-10-CM | POA: Diagnosis not present

## 2022-02-07 DIAGNOSIS — L89324 Pressure ulcer of left buttock, stage 4: Secondary | ICD-10-CM | POA: Diagnosis not present

## 2022-02-07 DIAGNOSIS — L89150 Pressure ulcer of sacral region, unstageable: Secondary | ICD-10-CM | POA: Diagnosis not present

## 2022-02-13 ENCOUNTER — Encounter (HOSPITAL_BASED_OUTPATIENT_CLINIC_OR_DEPARTMENT_OTHER): Payer: Medicare Other | Admitting: General Surgery

## 2022-02-13 ENCOUNTER — Encounter: Payer: Self-pay | Admitting: Urology

## 2022-02-13 DIAGNOSIS — M869 Osteomyelitis, unspecified: Secondary | ICD-10-CM | POA: Diagnosis not present

## 2022-02-13 DIAGNOSIS — G809 Cerebral palsy, unspecified: Secondary | ICD-10-CM | POA: Diagnosis not present

## 2022-02-13 DIAGNOSIS — L89154 Pressure ulcer of sacral region, stage 4: Secondary | ICD-10-CM | POA: Diagnosis not present

## 2022-02-13 DIAGNOSIS — L89324 Pressure ulcer of left buttock, stage 4: Secondary | ICD-10-CM | POA: Diagnosis not present

## 2022-02-13 NOTE — Progress Notes (Signed)
[name], [name] ([id number]) [phone].pdf Page 1 of 8 Visit Report for 02/13/2022 Arrival Information Details Patient Name: Date of Service: [place], [place] 02/13/2022 1:15 PM Medical Record Number: [id number] Patient Account Number: [id number] Date of Birth/Sex: Treating RN: 03/06/1960 (61 y.o. [name] Primary Care [name]: [name] Other Clinician: Referring Bruna Dills: Treating Alik Mawson/Extender: [name] in Treatment: 2 Visit Information History Since Last Visit Added or deleted any medications: No Patient Arrived: Wheel Chair Any new allergies or adverse reactions: No Arrival Time: 13:32 Had a fall or experienced change in No Accompanied By: sister activities of daily living that may affect Transfer Assistance: Michiel Sites Lift risk of falls: Patient Requires Transmission-Based Precautions: No Signs or symptoms of abuse/neglect since last visito No Patient Has Alerts: No Hospitalized since last visit: No Implantable device outside of the clinic excluding No cellular tissue based products placed in the center since last visit: Has Dressing in Place as Prescribed: Yes Pain Present Now: No Electronic Signature(s) Signed: 02/13/2022 4:49:39 PM By: Brett Ard RN Entered By: [name] on 02/13/2022 13:32:50 -------------------------------------------------------------------------------- Encounter Discharge Information Details Patient Name: Date of Service: [name], Brett [name]. 02/13/2022 1:15 PM Medical Record Number: [id number] Patient Account Number: [id number] Date of Birth/Sex: Treating RN: 1960-03-25 (61 y.o. [name] Primary Care Debralee Braaksma: [name] Other Clinician: Referring Sarissa Dern: Treating Brion Sossamon/Extender: [name] in Treatment: 2 Encounter Discharge Information Items Discharge Condition: Stable Ambulatory Status: Wheelchair Discharge Destination:  Home Transportation: Private Auto Accompanied By: sister Schedule Follow-up Appointment: Yes Clinical Summary of Care: Electronic Signature(s) Signed: 02/13/2022 2:33:08 PM By: Brett Ard RN Entered By: [name] on 02/13/2022 14:33:08 Blizard, [name] ([id number]) [id number] Page 2 of 8 -------------------------------------------------------------------------------- Lower Extremity Assessment Details Patient Name: Date of Service: [name] 02/13/2022 1:15 PM Medical Record Number: [id number] Patient Account Number: [id number] Date of Birth/Sex: Treating RN: 1960/04/22 (61 y.o. [name] Primary Care Aadi Bordner: [name] Other Clinician: Referring Angelissa Supan: Treating Marites Nath/Extender: [name] in Treatment: 2 Electronic Signature(s) Signed: 02/13/2022 4:49:39 PM By: Brett Ard RN Entered By: [name] on 02/13/2022 13:33:49 -------------------------------------------------------------------------------- Multi Wound Chart Details Patient Name: Date of Service: [name], Brett [name]. 02/13/2022 1:15 PM Medical Record Number: [id number] Patient Account Number: [id number] Date of Birth/Sex: Treating RN: [birth year] (61 y.o. M) Primary Care Khori Rosevear: [name] Other Clinician: Referring Lynnsie Linders: Treating Zyhir Cappella/Extender: [name] in Treatment: 2 Vital Signs Height(in): 68 Pulse(bpm): 92 Weight(lbs): 115 Blood Pressure(mmHg): 114/80 Body Mass Index(BMI): 17.5 Temperature(F): 97.5 Respiratory Rate(breaths/min): 18 [1:Photos:] [N/A:N/A] Coccyx Left Ischial Tuberosity N/A Wound Location: Pressure Injury Pressure Injury N/A Wounding Event: Acute Osteomyelitis Acute Osteomyelitis N/A Primary Etiology: 11/30/2021 11/30/2021 N/A Date Acquired: 2 2 N/A Weeks of Treatment: Open Open N/A Wound Status: No No N/A Wound Recurrence: 1.8x0.4x0.2 3.5x2.8x2  N/A Measurements Wise x W x D (cm) 0.565 7.697 N/A A (cm) : rea 0.113 15.394 N/A Volume (cm) : 82.90% 63.70% N/A % Reduction in A rea: 94.30% 51.60% N/A % Reduction in Volume: 9 Starting Position 1 (o'clock): 1 Ending Position 1 (o'clock): 4 Maximum Distance 1 (cm): No Yes N/A Undermining: Full Thickness Without Exposed Full Thickness Without Exposed N/A Classification: Support Structures Support Structures Medium Medium N/A Exudate Amount: Serous Serous N/A Exudate Type: amber amber N/A Exudate Color: Medium (34-66%) Large (67-100%) N/A Granulation Amount: [place], [name] ([id number]) [id number] Page 3 of 8 Pink Red, Pink N/A Granulation Quality: Medium (34-66%)  Small (1-33%) N/A Necrotic Amount: Fat Layer (Subcutaneous Tissue): Yes Fat Layer (Subcutaneous Tissue): Yes N/A Exposed Structures: Fascia: No Fascia: No Tendon: No Tendon: No Joint: No Muscle: No Bone: No Joint: No Bone: No Rash: Yes Excoriation: Yes N/A Periwound Skin Texture: Dry/Scaly: Yes Maceration: No N/A Periwound Skin Moisture: Dry/Scaly: No No Abnormalities Noted Erythema: Yes N/A Periwound Skin Color: No Abnormality No Abnormality N/A Temperature: Chemical Cauterization Chemical Cauterization N/A Procedures Performed: Treatment Notes Electronic Signature(s) Signed: 02/13/2022 2:01:21 PM By: Brett Guess MD FACS Entered By: [name] on 02/13/2022 14:01:21 -------------------------------------------------------------------------------- Multi-Disciplinary Care Plan Details Patient Name: Date of Service: [name], Brett [name]. 02/13/2022 1:15 PM Medical Record Number: [id number] Patient Account Number: [id number] Date of Birth/Sex: Treating RN: 05-21-1960 (61 y.o. [name] Primary Care Suzana Sohail: [name] Other Clinician: Referring Hitesh Fouche: Treating Mckenize Mezera/Extender: [name] in Treatment: 2 Active  Inactive Orientation to the Wound Care Program Nursing Diagnoses: Knowledge deficit related to the wound healing center program Goals: Patient/caregiver will verbalize understanding of the Wound Healing Center Program Date Initiated: 01/30/2022 Target Resolution Date: 04/02/2022 Goal Status: Active Interventions: Provide education on orientation to the wound center Notes: Osteomyelitis Nursing Diagnoses: Infection: osteomyelitis Goals: Patient/caregiver will verbalize understanding of disease process and disease management Date Initiated: 01/30/2022 Target Resolution Date: 04/03/2022 Goal Status: Active Interventions: Assess for signs and symptoms of osteomyelitis resolution every visit Provide education on osteomyelitis Treatment Activities: MRI : 01/20/2022 Surgical debridement : 01/30/2022 Notes: QUSAY, VILLADA ([id number]) [phone].pdf Page 4 of 8 Wound/Skin Impairment Nursing Diagnoses: Impaired tissue integrity Goals: Ulcer/skin breakdown will have a volume reduction of 30% by week 4 Date Initiated: 01/30/2022 Target Resolution Date: 03/03/2022 Goal Status: Active Interventions: Assess patient/caregiver ability to obtain necessary supplies Assess ulceration(s) every visit Treatment Activities: Skin care regimen initiated : 01/30/2022 Notes: Electronic Signature(s) Signed: 02/13/2022 4:49:39 PM By: Brett Ard RN Entered By: [name] on 02/13/2022 13:59:50 -------------------------------------------------------------------------------- Pain Assessment Details Patient Name: Date of Service: [name], Brett [name]. 02/13/2022 1:15 PM Medical Record Number: [id number] Patient Account Number: [id number] Date of Birth/Sex: Treating RN: 1960-09-08 (61 y.o. [name] Primary Care Marjo Grosvenor: [name] Other Clinician: Referring Kathalene Sporer: Treating Danah Reinecke/Extender: [name] in Treatment: 2 Active  Problems Location of Pain Severity and Description of Pain Patient Has Paino No Site Locations Rate the pain. Current Pain Level: 0 Pain Management and Medication Current Pain Management: Electronic Signature(s) Signed: 02/13/2022 4:49:39 PM By: Brett Ard RN Entered By: [name] on 02/13/2022 13:33:42 Drone, [name] ([id number]) [id number] Page 5 of 8 -------------------------------------------------------------------------------- Patient/Caregiver Education Details Patient Name: Date of Service: [name] 12/21/2023andnbsp1:15 PM Medical Record Number: [id number] Patient Account Number: [id number] Date of Birth/Gender: Treating RN: [birth year] (61 y.o. [name] Primary Care Physician: [name] Other Clinician: Referring Physician: Treating Physician/Extender: [name] in Treatment: 2 Education Assessment Education Provided To: Patient Education Topics Provided Wound Debridement: Methods: Explain/Verbal Responses: Reinforcements needed, State content correctly Wound/Skin Impairment: Methods: Explain/Verbal Responses: Reinforcements needed, State content correctly Electronic Signature(s) Signed: 02/13/2022 4:49:39 PM By: Brett Ard RN Entered By: [name] on 02/13/2022 13:40:26 -------------------------------------------------------------------------------- Wound Assessment Details Patient Name: Date of Service: [name], Brett [name]. 02/13/2022 1:15 PM Medical Record Number: [id number] Patient Account Number: [id number] Date of Birth/Sex: Treating RN: 07/14/1960 (61 y.o. [name] Primary Care Lou Irigoyen: [name] Other Clinician: Referring Suhailah Kwan: Treating Santresa Levett/Extender: [name] in Treatment: 2 Wound Status  Wound Number: 1 Primary Etiology: Acute Osteomyelitis Wound Location: Coccyx Wound Status: Open Wounding Event: Pressure  Injury Date Acquired: 11/30/2021 Weeks Of Treatment: 2 Clustered Wound: No Photos Geanie KenningBATES, Yunior Wise ([id number]) [phone] [id number] Page 6 of 8 Wound Measurements Length: (cm) 1.8 Width: (cm) 0.4 Depth: (cm) 0.2 Area: (cm) 0.565 Volume: (cm) 0.113 % Reduction in Area: 82.9% % Reduction in Volume: 94.3% Tunneling: No Undermining: No Wound Description Classification: Full Thickness Without Exposed Suppor Exudate Amount: Medium Exudate Type: Serous Exudate Color: amber t Structures Foul Odor After Cleansing: No Wound Bed Granulation Amount: Medium (34-66%) Exposed Structure Granulation Quality: Pink Fascia Exposed: No Necrotic Amount: Medium (34-66%) Fat Layer (Subcutaneous Tissue) Exposed: Yes Necrotic Quality: Adherent Slough Tendon Exposed: No Joint Exposed: No Bone Exposed: No Periwound Skin Texture Texture Color No Abnormalities Noted: No No Abnormalities Noted: Yes Rash: Yes Temperature / Pain Temperature: No Abnormality Moisture No Abnormalities Noted: No Dry / Scaly: Yes Treatment Notes Wound #1 (Coccyx) Cleanser Soap and Water Discharge Instruction: May shower and wash wound with dial antibacterial soap and water prior to dressing change. Peri-Wound Care Skin Prep Discharge Instruction: Use skin prep as directed Zinc Oxide Ointment 30g tube Discharge Instruction: Apply Zinc Oxide to periwound with each dressing change Topical Primary Dressing MediHoney Gel, tube 1.5 (oz) Discharge Instruction: Apply to wound bed as instructed Secondary Dressing Zetuvit Plus Silicone Border Sacrum Dressing, Sm, 7x7 (in/in) Discharge Instruction: Apply silicone border over primary dressing as directed. Secured With Compression Wrap Compression Stockings Facilities managerAdd-Ons Electronic Signature(s) Signed: 02/13/2022 4:49:39 PM By: Brett ArdZochol, Jamie RN Entered By: Brett ArdZochol, Jamie on 02/13/2022 13:41:28 Dohmen, Brett CheadleOBERT Wise ([id number]) [id number]  Page 7 of 8 -------------------------------------------------------------------------------- Wound Assessment Details Patient Name: Date of Service: Brett DionesBA TES, Brett [name]. 02/13/2022 1:15 PM Medical Record Number: [id number] Patient Account Number: [id number] Date of Birth/Sex: Treating RN: 12/26/1960 (61 y.o. Brett CavaM) Zochol, Jamie Primary Care Lamar Meter: Brett BeamsHagler, Rachel Other Clinician: Referring Ky Moskowitz: Treating Arthuro Canelo/Extender: Brett Dupesannon, Jennifer Hagler, Rachel Weeks in Treatment: 2 Wound Status Wound Number: 2 Primary Etiology: Acute Osteomyelitis Wound Location: Left Ischial Tuberosity Wound Status: Open Wounding Event: Pressure Injury Date Acquired: 11/30/2021 Weeks Of Treatment: 2 Clustered Wound: No Photos Wound Measurements Length: (cm) 3.5 Width: (cm) 2.8 Depth: (cm) 2 Area: (cm) 7.697 Volume: (cm) 15.394 % Reduction in Area: 63.7% % Reduction in Volume: 51.6% Tunneling: No Undermining: Yes Starting Position (o'clock): 9 Ending Position (o'clock): 1 Maximum Distance: (cm) 4 Wound Description Classification: Full Thickness Without Exposed Suppor Exudate Amount: Medium Exudate Type: Serous Exudate Color: amber t Structures Foul Odor After Cleansing: No Slough/Fibrino Yes Wound Bed Granulation Amount: Large (67-100%) Exposed Structure Granulation Quality: Red, Pink Fascia Exposed: No Necrotic Amount: Small (1-33%) Fat Layer (Subcutaneous Tissue) Exposed: Yes Necrotic Quality: Adherent Slough Tendon Exposed: No Muscle Exposed: No Joint Exposed: No Bone Exposed: No Periwound Skin Texture Texture Color No Abnormalities Noted: No No Abnormalities Noted: No Excoriation: Yes Erythema: Yes Moisture Temperature / Pain No Abnormalities Noted: No Temperature: No Abnormality Dry / Scaly: No Maceration: No Treatment Notes Wound #2 (Ischial Tuberosity) Wound Laterality: Left Cleanser Soap and Water Discharge Instruction: May shower and wash wound with dial  antibacterial soap and water prior to dressing change. Geanie KenningBATES, Zakkery Wise ([id number]) [id number] Page 8 of 8 Peri-Wound Care Skin Prep Discharge Instruction: Use skin prep as directed Zinc Oxide Ointment 30g tube Discharge Instruction: Apply Zinc Oxide to periwound with each dressing change Topical Primary Dressing MediHoney Gel, tube 1.5 (oz) Discharge Instruction: Apply to wound bed as instructed Secondary Dressing  Zetuvit Plus Silicone Border Dressing 5x5 (in/in) Discharge Instruction: Apply silicone border over primary dressing as directed. Secured With [employer] T 2x10 (in/yd) ape Discharge Instruction: Secure with tape as directed. Compression Wrap Compression Stockings Add-Ons Electronic Signature(s) Signed: 02/13/2022 4:49:39 PM By: Brett Ard RN Entered By: [name] on 02/13/2022 13:42:08 -------------------------------------------------------------------------------- Vitals Details Patient Name: Date of Service: [name], Brett [name]. 02/13/2022 1:15 PM Medical Record Number: [id number] Patient Account Number: [id number] Date of Birth/Sex: Treating RN: [birth year] (61 y.o. [name] Primary Care Mersedes Alber: [name] Other Clinician: Referring Katerra Ingman: Treating Lyndee Herbst/Extender: [name] in Treatment: 2 Vital Signs Time Taken: 13:33 Temperature (F): 97.5 Height (in): 68 Pulse (bpm): 92 Weight (lbs): 115 Respiratory Rate (breaths/min): 18 Body Mass Index (BMI): 17.5 Blood Pressure (mmHg): 114/80 Reference Range: 80 - 120 mg / dl Electronic Signature(s) Signed: 02/13/2022 4:49:39 PM By: Brett Ard RN Entered By: [name] on 02/13/2022 13:33:30

## 2022-02-13 NOTE — Patient Instructions (Signed)
Suprapubic Catheter Home Guide A suprapubic catheter is a flexible tube that is used to drain urine from the bladder into a collection bag outside the body. The catheter is inserted into the bladder through a small opening in the lower abdomen, above the pubic bone (suprapubic area) and a few inches below your belly button (navel). A tiny balloon filled with germ-free (sterile) water helps to keep the catheter in place. The collection bag must be emptied at least once a day and cleaned at least every other day. The collection bag can be put beside your bed at night and attached to your leg during the day. You may have a large collection bag to use at night and a smaller one to use during the day. Your suprapubic catheter may need to be changed every 4-6 weeks, or as often as recommended by your health care provider. Healing of the tract where the catheter is placed can take 6 weeks to 6 months. During that time, your health care provider may change your catheter. Once the tract is well healed, you or a caregiver will change your suprapubic catheter at home. What are the risks? This catheter is safe to use. However, problems can occur, including: Blocked urine flow. This can occur if the catheter stops working, or if you have a blood clot in your bladder or in the catheter. Irritation of the skin around the catheter. Infection. This can happen if bacteria gets into your bladder. Supplies needed: Two pairs of sterile gloves. Paper towels. Catheter. Two syringes. Sterile water. Sterile cleaning solution. Lubricant. Collection bags. How to change the catheter  Drink plenty of fluids during the hours before you change the catheter. Wash your hands with soap and water. If soap and water are not available, use hand sanitizer. Draw up sterile water into a syringe to have ready to fill the new catheter balloon. The amount will depend on the size of the balloon. Have all of your supplies ready and close  to you on a paper towel. Lie on your back, sitting slightly upright so that you can see the catheter and opening. Put on sterile gloves. Clean the skin around the catheter opening using the sterile cleaning solution. Remove the water from the balloon in the catheter using a syringe. Slowly remove the catheter. If the catheter seems stuck, or if you have difficulty removing it: Do not pull on it. Call your health care provider right away. Place the old catheter on a paper towel to discard later. Take off the used gloves, and put on a new pair. Put lubricant on the end of the new catheter that will go into your bladder. Clean the skin around the catheter opening using the sterile cleaning solution. Gently slide the catheter through the opening in your abdomen and into the tract that leads to your bladder. Wait for some urine to start flowing through the catheter. When urine starts to flow through the catheter, attach the collection bag to the end of the catheter. Make sure the connection is tight. Use a syringe to fill the catheter balloon with sterile water. Fill to the amount directed by your health care provider. Remove the gloves and wash your hands with soap and water. How to care for the skin around the catheter Follow your health care provider's instructions on caring for your skin. Use a clean washcloth and soapy water to clean the skin around your catheter every day. Pat the area dry with a clean paper towel. Do not  pull on the catheter. Do not use ointment or lotion on this area, unless told by your health care provider. Check the skin around the catheter every day for signs of infection. Check for: Redness, swelling, or pain. Fluid or blood. Warmth. Pus or a bad smell. How to empty and clean the collection bag Empty the large collection bag every 8 hours. Empty the small collection bag when it is about ? full. Clean the collection bag every 2-3 days, or as often as told by your  health care provider. To do this: Wash your hands with soap and water. If soap and water are not available, use hand sanitizer. Disconnect the bag from the catheter and immediately attach a new bag to the catheter. Hold the used bag over the toilet or another container. Turn the valve (spigot) at the bottom of the bag to empty the urine. Empty the used bag completely. Do not touch the opening of the spigot. Do not let the opening touch the toilet or container. Close the spigot tightly when the bag is empty. Clean the used bag in one of the following methods: According to the manufacturer's instructions. As told by your health care provider. Let the bag dry completely. Put it in a clean plastic bag before storing it. General tips  Always wash your hands before and after caring for your catheter and collection bag. Use a mild, fragrance-free soap. If soap and water are not available, use hand sanitizer. Clean the outside of the catheter with soap and water as often as told by your health care provider. Always make sure there are no twists or kinks in the catheter tube. Always make sure there are no leaks in the catheter or collection bag. Always wear the leg bag below your knee. Make sure the overnight drainage bag is always lower than the level of your bladder, but do not let it touch the floor. Before you go to sleep, hang the bag inside a wastebasket that is covered by a clean plastic bag. Drink enough fluid to keep your urine pale yellow. Do not take baths, swim, or use a hot tub until your health care provider approves. Ask your health care provider if you may take showers. Contact a heath care provider if: You leak urine. You have redness, swelling, or pain around your catheter. You have fluid or blood coming from your catheter opening. Your catheter opening feels warm to the touch. You have pus or a bad smell coming from your catheter opening. You have a fever or chills. Your urine  flow slows down. Your urine becomes cloudy or smelly. Get help right away if: Your catheter comes out. You have: Nausea. Back pain. Difficulty changing your catheter. Blood in your urine. No urine flow for 1 hour. Summary A suprapubic catheter is a flexible tube that is used to drain urine from the bladder into a collection bag outside the body. Your suprapubic catheter may need to be changed every 4-6 weeks, or as recommended by your health care provider. Follow instructions on how to change the catheter and how to empty and clean the collection bag. Always wash your hands before and after caring for your catheter and collection bag. Drink enough fluid to keep your urine pale yellow. Get help right away if you have difficulty changing your catheter or if there is blood in your urine. This information is not intended to replace advice given to you by your health care provider. Make sure you discuss any questions   you have with your health care provider. Document Revised: 07/16/2021 Document Reviewed: 03/17/2018 Elsevier Patient Education  2023 [employer].

## 2022-02-13 NOTE — Progress Notes (Addendum)
[name] ([id number]) [phone].pdf Page 1 of 9 Visit Report for 02/13/2022 Chief Complaint Document Details Patient Name: Date of Service: [name] 02/13/2022 1:15 PM Medical Record Number: [id number] Patient Account Number: [id number] Date of Birth/Sex: Treating RN: 17-Oct-1960 (61 y.o. M) Primary Care Provider: Caren Wise Other Clinician: Referring Provider: Treating Provider/Extender: [name] in Treatment: 2 Information Obtained from: Patient Chief Complaint Patient is at the clinic for treatment of open pressure ulcers Electronic Signature(s) Signed: 02/13/2022 2:01:27 PM By: Fredirick Maudlin MD FACS Entered By: Fredirick Maudlin on 02/13/2022 14:01:26 -------------------------------------------------------------------------------- HPI Details Patient Name: Date of Service: [name], Brett BERT Wise. 02/13/2022 1:15 PM Medical Record Number: [id number] Patient Account Number: [id number] Date of Birth/Sex: Treating RN: 09-01-1960 (61 y.o. M) Primary Care Provider: Caren Wise Other Clinician: Referring Provider: Treating Provider/Extender: [name] in Treatment: 2 History of Present Illness HPI Description: ADMISSION 01/30/2022 This is a 61 year old man with cerebral palsy. He is nonverbal at baseline. He is accompanied by his sister, who is his primary caregiver, and his aunt. He was admitted to the hospital on November 11 with urinary retention and was identified as having a left gluteal pressure ulcer and a sacral pressure ulcer. His sister says that the wounds have been there for about a month. A CT scan of the abdomen and pelvis was performed that was suggestive of osteomyelitis. There was also concern for infection of his wound. He was placed on antibiotics. General surgery was consulted and did not think there was any role for surgical intervention. They have been managing the  wounds at home with topical Medihoney and then packing the cavities with saline moistened gauze. A Roho cushion has already been ordered for his wheelchair and they are in the process of obtaining a low-air-loss mattress bed for him. The ulcer over his sacrum is fairly small with just a little bit of depth to it. There is good granulation tissue present with just a bit of slough in the base. The ischial ulcer is more extensive and there is tendon exposed at the base. There is also evidence of ongoing pressure-induced tissue injury. 02/13/2022: Both wounds are smaller today. There is still a fair amount of depth to the ischial ulcer. The sacral is fairly shallow. There is hypertrophic granulation tissue at both sites. I do not see any further evidence of pressure induced tissue injury. Electronic Signature(s) Signed: 02/13/2022 2:02:18 PM By: Fredirick Maudlin MD FACS Entered By: Fredirick Maudlin on 02/13/2022 14:02:18 [name], [name] ([id number].pdf Page 2 of 9 -------------------------------------------------------------------------------- Chemical Cauterization Details Patient Name: Date of Service: [name] 02/13/2022 1:15 PM Medical Record Number: [id number] Patient Account Number: [id number] Date of Birth/Sex: Treating RN: 1961-02-09 (61 y.o. Waldron Session Primary Care Provider: Caren Wise Other Clinician: Referring Provider: Treating Provider/Extender: [name] in Treatment: 2 Procedure Performed for: Wound #1 Coccyx Performed By: Physician Fredirick Maudlin, MD Post Procedure Diagnosis Same as Pre-procedure Notes Scribed for Dr. Celine Ahr by Blanche East, RN Electronic Signature(s) Signed: 02/13/2022 2:14:20 PM By: Fredirick Maudlin MD FACS Signed: 02/13/2022 4:49:39 PM By: Blanche East RN Entered By: Blanche East on 02/13/2022  13:59:04 -------------------------------------------------------------------------------- Chemical Cauterization Details Patient Name: Date of Service: [name], Brett BERT Wise. 02/13/2022 1:15 PM Medical Record Number: [id number] Patient Account Number: [id number] Date of Birth/Sex: Treating RN: 09-04-1960 (61 y.o. Waldron Session Primary Care Provider: Caren Wise Other Clinician: Referring Provider: Treating Provider/Extender: Fredirick Maudlin  Lajuana Ripple in Treatment: 2 Procedure Performed for: Wound #2 Left Ischial Tuberosity Performed By: Physician Fredirick Maudlin, MD Post Procedure Diagnosis Same as Pre-procedure Notes Scribed for Dr. Celine Ahr By Blanche East, RN Electronic Signature(s) Signed: 02/13/2022 2:14:20 PM By: Fredirick Maudlin MD FACS Signed: 02/13/2022 4:49:39 PM By: Blanche East RN Entered By: Blanche East on 02/13/2022 13:59:30 -------------------------------------------------------------------------------- Physical Exam Details Patient Name: Date of Service: [name], Brett BERT Wise. 02/13/2022 1:15 PM Medical Record Number: [id number] Patient Account Number: [id number] Date of Birth/Sex: Treating RN: [birth year] (61 y.o. M) Primary Care Provider: Caren Wise Other Clinician: Referring Provider: Treating Provider/Extender: [name] in Treatment: 2 [name], [name] ([id number]) [id number] Page 3 of 9 Constitutional . . . . No acute distress. Respiratory Normal work of breathing on room air. Notes 02/13/2022: Both wounds are smaller today. There is still a fair amount of depth to the ischial ulcer. The sacral is fairly shallow. There is hypertrophic granulation tissue at both sites. I do not see any further evidence of pressure induced tissue injury. Electronic Signature(s) Signed: 02/13/2022 2:02:54 PM By: Fredirick Maudlin MD FACS Entered By: Fredirick Maudlin on 02/13/2022  14:02:54 -------------------------------------------------------------------------------- Physician Orders Details Patient Name: Date of Service: [name], Brett BERT Wise. 02/13/2022 1:15 PM Medical Record Number: [id number] Patient Account Number: [id number] Date of Birth/Sex: Treating RN: [birth year] (61 y.o. Waldron Session Primary Care Provider: Caren Wise Other Clinician: Referring Provider: Treating Provider/Extender: [name] in Treatment: 2 Verbal / Phone Orders: No Diagnosis Coding ICD-10 Coding Code Description G80.9 Cerebral palsy, unspecified M86.9 Osteomyelitis, unspecified L89.154 Pressure ulcer of sacral region, 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, stage 4 Follow-up Appointments ppointment in 2 weeks. - Dr. Celine Ahr Return A ***HOYER*** EXTRA TIME Anesthetic Wound #1 Coccyx (In clinic) Topical Lidocaine 5% applied to wound bed - in clinic, prior to debridement Wound #2 Left Ischial Tuberosity (In clinic) Topical Lidocaine 5% applied to wound bed - in clinic, prior to debridement Bathing/ Shower/ Hygiene May shower and wash wound with soap and water. Wound Treatment Wound #1 - Coccyx Cleanser: Soap and Water 1 x Per Day/30 Days Discharge Instructions: May shower and wash wound with dial antibacterial soap and water prior to dressing change. Peri-Wound Care: Skin Prep 1 x Per Day/30 Days Discharge Instructions: Use skin prep as directed Peri-Wound Care: Zinc Oxide Ointment 30g tube 1 x Per Day/30 Days Discharge Instructions: Apply Zinc Oxide to periwound with each dressing change Prim Dressing: MediHoney Gel, tube 1.5 (oz) 1 x Per Day/30 Days ary Discharge Instructions: Apply to wound bed as instructed Secondary Dressing: Zetuvit Plus Silicone Border Sacrum Dressing, Sm, 7x7 (in/in) 1 x Per Day/30 Days Discharge Instructions: Apply silicone border over primary dressing as directed. Wound #2 - Ischial Tuberosity Wound  Laterality: Left Cleanser: Soap and Water 1 x Per Day/30 Days KEANI, NUGEN ([id number]) [id number].pdf Page 4 of 9 Discharge Instructions: May shower and wash wound with dial antibacterial soap and water prior to dressing change. Peri-Wound Care: Skin Prep 1 x Per Day/30 Days Discharge Instructions: Use skin prep as directed Peri-Wound Care: Zinc Oxide Ointment 30g tube 1 x Per Day/30 Days Discharge Instructions: Apply Zinc Oxide to periwound with each dressing change Prim Dressing: MediHoney Gel, tube 1.5 (oz) 1 x Per Day/30 Days ary Discharge Instructions: Apply to wound bed as instructed Secondary Dressing: Zetuvit Plus Silicone Border Dressing 5x5 (in/in) 1 x Per Day/30 Days Discharge Instructions: Apply silicone border over primary dressing  as directed. Secured With: 2M Medipore Public affairs consultant Surgical T 2x10 (in/yd) 1 x Per Day/30 Days ape Discharge Instructions: Secure with tape as directed. Electronic Signature(s) Signed: 03/03/2022 2:25:03 PM By: Fredirick Maudlin MD FACS Signed: 03/03/2022 5:04:52 PM By: Dellie Catholic RN Previous Signature: 02/13/2022 2:14:20 PM Version By: Fredirick Maudlin MD FACS Entered By: Dellie Catholic on 02/27/2022 14:31:27 -------------------------------------------------------------------------------- Problem List Details Patient Name: Date of Service: [name], Brett BERT Wise. 02/13/2022 1:15 PM Medical Record Number: [id number] Patient Account Number: [id number] Date of Birth/Sex: Treating RN: 1960-09-14 (61 y.o. M) Primary Care Provider: Caren Wise Other Clinician: Referring Provider: Treating Provider/Extender: [name] in Treatment: 2 Active Problems ICD-10 Encounter Code Description Active Date MDM Diagnosis G80.9 Cerebral palsy, unspecified 01/30/2022 No Yes M86.9 Osteomyelitis, unspecified 01/30/2022 No Yes L89.154 Pressure ulcer of sacral region, stage 4 01/30/2022 No Yes L89.324 Pressure  ulcer of left buttock, stage 4 01/30/2022 No Yes Inactive Problems Resolved Problems Electronic Signature(s) Signed: 02/13/2022 2:01:15 PM By: Fredirick Maudlin MD FACS Entered By: Fredirick Maudlin on 02/13/2022 14:01:15 [name], [name] ([id number] Page 5 of 9 -------------------------------------------------------------------------------- Progress Note Details Patient Name: Date of Service: [name] 02/13/2022 1:15 PM Medical Record Number: [id number] Patient Account Number: [id number] Date of Birth/Sex: Treating RN: [birth year] (61 y.o. M) Primary Care Provider: Caren Wise Other Clinician: Referring Provider: Treating Provider/Extender: [name] in Treatment: 2 Subjective Chief Complaint Information obtained from Patient Patient is at the clinic for treatment of open pressure ulcers History of Present Illness (HPI) ADMISSION 01/30/2022 This is a 61 year old man with cerebral palsy. He is nonverbal at baseline. He is accompanied by his sister, who is his primary caregiver, and his aunt. He was admitted to the hospital on November 11 with urinary retention and was identified as having a left gluteal pressure ulcer and a sacral pressure ulcer. His sister says that the wounds have been there for about a month. A CT scan of the abdomen and pelvis was performed that was suggestive of osteomyelitis. There was also concern for infection of his wound. He was placed on antibiotics. General surgery was consulted and did not think there was any role for surgical intervention. They have been managing the wounds at home with topical Medihoney and then packing the cavities with saline moistened gauze. A Roho cushion has already been ordered for his wheelchair and they are in the process of obtaining a low-air-loss mattress bed for him. The ulcer over his sacrum is fairly small with just a little bit of depth to it. There  is good granulation tissue present with just a bit of slough in the base. The ischial ulcer is more extensive and there is tendon exposed at the base. There is also evidence of ongoing pressure-induced tissue injury. 02/13/2022: Both wounds are smaller today. There is still a fair amount of depth to the ischial ulcer. The sacral is fairly shallow. There is hypertrophic granulation tissue at both sites. I do not see any further evidence of pressure induced tissue injury. Patient History Information obtained from Patient. Family History Cancer - Father,Maternal Grandparents, Diabetes - Mother, Heart Disease - Mother, Hypertension - Siblings,Father,Mother, No family history of Hereditary Spherocytosis, Kidney Disease, Lung Disease, Seizures, Stroke, Thyroid Problems, Tuberculosis. Social History Never smoker, Marital Status - Single, Alcohol Use - Never, Drug Use - No History, Caffeine Use - Daily. Medical History Eyes Denies history of Cataracts, Glaucoma, Optic Neuritis Ear/Nose/Mouth/Throat Denies history of Chronic sinus problems/congestion, Middle  ear problems Hematologic/Lymphatic Denies history of Anemia, Hemophilia, Human Immunodeficiency Virus, Lymphedema, Sickle Cell Disease Respiratory Denies history of Aspiration, Asthma, Chronic Obstructive Pulmonary Disease (COPD), Pneumothorax, Sleep Apnea, Tuberculosis Cardiovascular Denies history of Angina, Arrhythmia, Congestive Heart Failure, Coronary Artery Disease, Deep Vein Thrombosis, Hypertension, Hypotension, Myocardial Infarction, Peripheral Arterial Disease, Peripheral Venous Disease, Phlebitis, Vasculitis Endocrine Denies history of Type I Diabetes, Type II Diabetes Integumentary (Skin) Denies history of History of Burn Oncologic Denies history of Received Chemotherapy, Received Radiation Psychiatric Denies history of Anorexia/bulimia Medical A Surgical History Notes nd Genitourinary nephrolithiasis nonfunctioning  kidney Neurologic cerebral palsy Objective [name], [name] ([id number] Page 6 of 9 Constitutional No acute distress. Vitals Time Taken: 1:33 PM, Height: 68 in, Weight: 115 lbs, BMI: 17.5, Temperature: 97.5 F, Pulse: 92 bpm, Respiratory Rate: 18 breaths/min, Blood Pressure: 114/80 mmHg. Respiratory Normal work of breathing on room air. General Notes: 02/13/2022: Both wounds are smaller today. There is still a fair amount of depth to the ischial ulcer. The sacral is fairly shallow. There is hypertrophic granulation tissue at both sites. I do not see any further evidence of pressure induced tissue injury. Integumentary (Hair, Skin) Wound #1 status is Open. Original cause of wound was Pressure Injury. The date acquired was: 11/30/2021. The wound has been in treatment 2 weeks. The wound is located on the Coccyx. The wound measures 1.8cm length x 0.4cm width x 0.2cm depth; 0.565cm^2 area and 0.113cm^3 volume. There is Fat Layer (Subcutaneous Tissue) exposed. There is no tunneling or undermining noted. There is a medium amount of serous drainage noted. There is medium (34-66%) pink granulation within the wound bed. There is a medium (34-66%) amount of necrotic tissue within the wound bed including Adherent Slough. The periwound skin appearance had no abnormalities noted for color. The periwound skin appearance exhibited: Rash, Dry/Scaly. Periwound temperature was noted as No Abnormality. Wound #2 status is Open. Original cause of wound was Pressure Injury. The date acquired was: 11/30/2021. The wound has been in treatment 2 weeks. The wound is located on the Left Ischial Tuberosity. The wound measures 3.5cm length x 2.8cm width x 2cm depth; 7.697cm^2 area and 15.394cm^3 volume. There is Fat Layer (Subcutaneous Tissue) exposed. There is no tunneling noted, however, there is undermining starting at 9:00 and ending at 1:00 with a maximum distance of 4cm. There is  a medium amount of serous drainage noted. There is large (67-100%) red, pink granulation within the wound bed. There is a small (1- 33%) amount of necrotic tissue within the wound bed including Adherent Slough. The periwound skin appearance exhibited: Excoriation, Erythema. The periwound skin appearance did not exhibit: Dry/Scaly, Maceration. The surrounding wound skin color is noted with erythema. Periwound temperature was noted as No Abnormality. 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 An Chemical Cauterization procedure was performed by Fredirick Maudlin, MD. Post procedure Diagnosis Wound #1: Same as Pre-Procedure Notes: Scribed for Dr. Celine Ahr by Blanche East, RN Wound #2 Pre-procedure diagnosis of Wound #2 is an Acute Osteomyelitis located on the Left Ischial Tuberosity . An Chemical Cauterization procedure was performed by Fredirick Maudlin, MD. Post procedure Diagnosis Wound #2: Same as Pre-Procedure Notes: Scribed for Dr. Celine Ahr By Blanche East, RN Plan Follow-up Appointments: Return Appointment in 2 weeks. - Dr. Celine Ahr ***HOYER*** EXTRA TIME Anesthetic: Wound #1 Coccyx: (In  clinic) Topical Lidocaine 5% applied to wound bed - in clinic, prior to debridement Wound #2 Left Ischial Tuberosity: (In clinic) Topical Lidocaine 5% applied to wound bed - in clinic, prior to debridement Bathing/ Shower/ Hygiene: May shower and wash wound with soap and water. WOUND #1: - Coccyx Wound Laterality: Cleanser: Soap and Water 1 x Per Day/30 Days Discharge Instructions: May shower and wash wound with dial antibacterial soap and water prior to dressing change. Peri-Wound Care: Skin Prep 1 x Per Day/30 Days Discharge Instructions: Use skin prep as  directed Peri-Wound Care: Zinc Oxide Ointment 30g tube 1 x Per Day/30 Days Discharge Instructions: Apply Zinc Oxide to periwound with each dressing change Prim Dressing: MediHoney Gel, tube 1.5 (oz) 1 x Per Day/30 Days ary Discharge Instructions: Apply to wound bed as instructed Secondary Dressing: Zetuvit Plus Silicone Border Sacrum Dressing, Sm, 7x7 (in/in) 1 x Per Day/30 Days [name], [name] ([id number]) [phone].pdf Page 7 of 9 Discharge Instructions: Apply silicone border over primary dressing as directed. WOUND #2: - Ischial Tuberosity Wound Laterality: Left Cleanser: Soap and Water 1 x Per Day/30 Days Discharge Instructions: May shower and wash wound with dial antibacterial soap and water prior to dressing change. Peri-Wound Care: Skin Prep 1 x Per Day/30 Days Discharge Instructions: Use skin prep as directed Peri-Wound Care: Zinc Oxide Ointment 30g tube 1 x Per Day/30 Days Discharge Instructions: Apply Zinc Oxide to periwound with each dressing change Prim Dressing: MediHoney Gel, tube 1.5 (oz) 1 x Per Day/30 Days ary Discharge Instructions: Apply to wound bed as instructed Secondary Dressing: Zetuvit Plus Silicone Border Dressing 5x5 (in/in) 1 x Per Day/30 Days Discharge Instructions: Apply silicone border over primary dressing as directed. Secured With: 70M Medipore Scientist, research (life sciences) Surgical T 2x10 (in/yd) 1 x Per Day/30 Days ape Discharge Instructions: Secure with tape as directed. 02/13/2022: Both wounds are smaller today. There is still a fair amount of depth to the ischial ulcer. The sacral is fairly shallow. There is hypertrophic granulation tissue at both sites. I do not see any further evidence of pressure induced tissue injury. No debridement was necessary today. I used silver nitrate to chemically cauterize the hypertrophic granulation tissue on both wounds. We will continue Medihoney on the surface of each wound and continue to pack the ischial wound  with saline moistened gauze. Follow-up in 2 weeks. Electronic Signature(s) Signed: 03/03/2022 2:25:03 PM By: Duanne Guess MD FACS Signed: 03/03/2022 5:04:52 PM By: Karie Schwalbe RN Previous Signature: 02/13/2022 2:07:10 PM Version By: Duanne Guess MD FACS Entered By: Karie Schwalbe on 02/27/2022 14:31:47 -------------------------------------------------------------------------------- Brett Details Patient Name: Date of Service: Kimberlee Wise, Brett BERT Wise. 02/13/2022 1:15 PM Medical Record Number: [id number] Patient Account Number: [id number] Date of Birth/Sex: Treating RN: [birth year] (61 y.o. M) Primary Care Provider: Aliene Beams Other Clinician: Referring Provider: Treating Provider/Extender: Karna Dupes in Treatment: 2 Information Obtained From Patient Eyes Medical History: Negative for: Cataracts; Glaucoma; Optic Neuritis Ear/Nose/Mouth/Throat Medical History: Negative for: Chronic sinus problems/congestion; Middle ear problems Hematologic/Lymphatic Medical History: Negative for: Anemia; Hemophilia; Human Immunodeficiency Virus; Lymphedema; Sickle Cell Disease Respiratory Medical History: Negative for: Aspiration; Asthma; Chronic Obstructive Pulmonary Disease (COPD); Pneumothorax; Sleep Apnea; Tuberculosis Cardiovascular Medical History: Negative for: Angina; Arrhythmia; Congestive Heart Failure; Coronary Artery Disease; Deep Vein Thrombosis; Hypertension; Hypotension; Myocardial Infarction; Peripheral Arterial Disease; Peripheral Venous Disease; Phlebitis; Vasculitis Endocrine Medical HistoryDEMARIS, [name] ([id number]) [phone].pdf Page 8 of 9 Negative for: Type I Diabetes; Type II Diabetes Genitourinary Medical History: Past Medical History  Notes: nephrolithiasis nonfunctioning kidney Integumentary (Skin) Medical History: Negative for: History of Burn Neurologic Medical History: Past Medical History  Notes: cerebral palsy Oncologic Medical History: Negative for: Received Chemotherapy; Received Radiation Psychiatric Medical History: Negative for: Anorexia/bulimia Immunizations Pneumococcal Vaccine: Received Pneumococcal Vaccination: Yes Received Pneumococcal Vaccination On or After 60th Birthday: No Implantable Devices None Family and Social History Cancer: Yes - Father,Maternal Grandparents; Diabetes: Yes - Mother; Heart Disease: Yes - Mother; Hereditary Spherocytosis: No; Hypertension: Yes - Siblings,Father,Mother; Kidney Disease: No; Lung Disease: No; Seizures: No; Stroke: No; Thyroid Problems: No; Tuberculosis: No; Never smoker; Marital Status - Single; Alcohol Use: Never; Drug Use: No History; Caffeine Use: Daily; Financial Concerns: No; Food, Clothing or Shelter Needs: No; Support System Lacking: No; Transportation Concerns: No Electronic Signature(s) Signed: 02/13/2022 2:14:20 PM By: Fredirick Maudlin MD FACS Entered By: Fredirick Maudlin on 02/13/2022 14:02:29 -------------------------------------------------------------------------------- SuperBill Details Patient Name: Date of Service: [name], Brett BERT Wise. 02/13/2022 Medical Record Number: [id number] Patient Account Number: [id number] Date of Birth/Sex: Treating RN: 1961-02-20 (61 y.o. M) Primary Care Provider: Caren Wise Other Clinician: Referring Provider: Treating Provider/Extender: [name] in Treatment: 2 Diagnosis Coding ICD-10 Codes Code Description G80.9 Cerebral palsy, unspecified M86.9 Osteomyelitis, unspecified L89.154 Pressure ulcer of sacral region, 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, stage 4 [name], [name] ([id number].pdf Page 9 of 9 Facility Procedures : CPT4 Code: [id number] Description: [id number] - CHEM CAUT GRANULATION TISS ICD-10 Diagnosis Description L89.154 Pressure ulcer of sacral region, stage 4 L89.324 Pressure ulcer  of left buttock, stage 4 Modifier: Quantity: 2 Physician Procedures : CPT4 Code Description Modifier [id number] - WC PHYS LEVEL 4 - EST PT ICD-10 Diagnosis Description L89.154 Pressure ulcer of sacral region, 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, stage 4 G80.9 Cerebral palsy, unspecified M86.9 Osteomyelitis,  unspecified Quantity: 1 : [id number] - WC PHYS CHEM CAUT GRAN TISSUE ICD-10 Diagnosis Description L89.154 Pressure ulcer of sacral region, 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, stage 4 Quantity: 2 Electronic Signature(s) Signed: 02/13/2022 2:07:55 PM By: Fredirick Maudlin MD FACS Entered By: Fredirick Maudlin on 02/13/2022 14:07:54

## 2022-02-14 DIAGNOSIS — L89150 Pressure ulcer of sacral region, unstageable: Secondary | ICD-10-CM | POA: Diagnosis not present

## 2022-02-14 DIAGNOSIS — B964 Proteus (mirabilis) (morganii) as the cause of diseases classified elsewhere: Secondary | ICD-10-CM | POA: Diagnosis not present

## 2022-02-14 DIAGNOSIS — L89324 Pressure ulcer of left buttock, stage 4: Secondary | ICD-10-CM | POA: Diagnosis not present

## 2022-02-14 DIAGNOSIS — M4628 Osteomyelitis of vertebra, sacral and sacrococcygeal region: Secondary | ICD-10-CM | POA: Diagnosis not present

## 2022-02-19 DIAGNOSIS — L89150 Pressure ulcer of sacral region, unstageable: Secondary | ICD-10-CM | POA: Diagnosis not present

## 2022-02-19 DIAGNOSIS — L89324 Pressure ulcer of left buttock, stage 4: Secondary | ICD-10-CM | POA: Diagnosis not present

## 2022-02-19 DIAGNOSIS — M4628 Osteomyelitis of vertebra, sacral and sacrococcygeal region: Secondary | ICD-10-CM | POA: Diagnosis not present

## 2022-02-19 DIAGNOSIS — B964 Proteus (mirabilis) (morganii) as the cause of diseases classified elsewhere: Secondary | ICD-10-CM | POA: Diagnosis not present

## 2022-02-27 ENCOUNTER — Encounter (HOSPITAL_BASED_OUTPATIENT_CLINIC_OR_DEPARTMENT_OTHER): Payer: Medicare Other | Attending: General Surgery | Admitting: General Surgery

## 2022-02-27 DIAGNOSIS — R339 Retention of urine, unspecified: Secondary | ICD-10-CM | POA: Diagnosis not present

## 2022-02-27 DIAGNOSIS — Z87440 Personal history of urinary (tract) infections: Secondary | ICD-10-CM | POA: Diagnosis not present

## 2022-02-27 DIAGNOSIS — M869 Osteomyelitis, unspecified: Secondary | ICD-10-CM | POA: Diagnosis not present

## 2022-02-27 DIAGNOSIS — L89324 Pressure ulcer of left buttock, stage 4: Secondary | ICD-10-CM | POA: Diagnosis not present

## 2022-02-27 DIAGNOSIS — G808 Other cerebral palsy: Secondary | ICD-10-CM | POA: Diagnosis not present

## 2022-02-27 DIAGNOSIS — G809 Cerebral palsy, unspecified: Secondary | ICD-10-CM | POA: Insufficient documentation

## 2022-02-27 DIAGNOSIS — L89154 Pressure ulcer of sacral region, stage 4: Secondary | ICD-10-CM | POA: Insufficient documentation

## 2022-02-27 DIAGNOSIS — Z466 Encounter for fitting and adjustment of urinary device: Secondary | ICD-10-CM | POA: Diagnosis not present

## 2022-02-27 NOTE — Progress Notes (Addendum)
ARMARI, FUSSELL ([id number]) [id number] Page 1 of 8 Visit Report for 02/27/2022 Chief Complaint Document Details Patient Name: Date of Service: [place], [place] 02/27/2022 2:45 PM Medical Record Number: [id number] Patient Account Number: [id number] Date of Birth/Sex: Treating RN: 07-02-1960 (62 y.o. M) Primary Care Provider: Aliene Beams Other Clinician: Referring Provider: Treating Provider/Extender: Karna Dupes in Treatment: 4 Information Obtained from: Patient Chief Complaint Patient is at the clinic for treatment of open pressure ulcers Electronic Signature(s) Signed: 02/27/2022 3:51:41 PM By: Duanne Guess MD FACS Entered By: Duanne Guess on 02/27/2022 15:51:41 -------------------------------------------------------------------------------- HPI Details Patient Name: Date of Service: [name], [name] 02/27/2022 2:45 PM Medical Record Number: [id number] Patient Account Number: [id number] Date of Birth/Sex: Treating RN: [birth year] (62 y.o. M) Primary Care Provider: Aliene Beams Other Clinician: Referring Provider: Treating Provider/Extender: Karna Dupes in Treatment: 4 History of Present Illness HPI Description: ADMISSION 01/30/2022 This is a 62 year old man with cerebral palsy. He is nonverbal at baseline. He is accompanied by his sister, who is his primary caregiver, and his aunt. He was admitted to the hospital on November 11 with urinary retention and was identified as having a left gluteal pressure ulcer and a sacral pressure ulcer. His sister says that the wounds have been there for about a month. A CT scan of the abdomen and pelvis was performed that was suggestive of osteomyelitis. There was also concern for infection of his wound. He was placed on antibiotics. General surgery was consulted and did not think there was any role for surgical intervention. They have been managing the wounds at  home with topical Medihoney and then packing the cavities with saline moistened gauze. A Roho cushion has already been ordered for his wheelchair and they are in the process of obtaining a low-air-loss mattress bed for him. The ulcer over his sacrum is fairly small with just a little bit of depth to it. There is good granulation tissue present with just a bit of slough in the base. The ischial ulcer is more extensive and there is tendon exposed at the base. There is also evidence of ongoing pressure-induced tissue injury. 02/13/2022: Both wounds are smaller today. There is still a fair amount of depth to the ischial ulcer. The sacral is fairly shallow. There is hypertrophic granulation tissue at both sites. I do not see any further evidence of pressure induced tissue injury. 02/27/2022: The sacral ulcer is almost completely closed. There is a pinhole opening remaining. The ischial ulcer has filled in considerably and I do not appreciate exposed tendon any longer. The hypertrophic granulation tissue has not reaccumulated. No concern for infection. Electronic Signature(s) Signed: 02/27/2022 3:52:52 PM By: Duanne Guess MD FACS Entered By: Duanne Guess on 02/27/2022 15:52:52 Lozinski, [name] ([id number]) [id number] Page 2 of 8 -------------------------------------------------------------------------------- Physical Exam Details Patient Name: Date of Service: Retta Diones 02/27/2022 2:45 PM Medical Record Number: [id number] Patient Account Number: [id number] Date of Birth/Sex: Treating RN: 10/06/1960 (62 y.o. M) Primary Care Provider: Aliene Beams Other Clinician: Referring Provider: Treating Provider/Extender: Karna Dupes in Treatment: 4 Constitutional . . . . no acute distress. Respiratory Normal work of breathing on room air. Notes 02/27/2022: The sacral ulcer is almost completely closed. There is a pinhole opening remaining. The  ischial ulcer has filled in considerably and I do not appreciate exposed tendon any longer. The hypertrophic granulation tissue has not reaccumulated. No concern for infection. Electronic Signature(s) Signed: 02/27/2022 3:53:27  PM By: Duanne Guess MD FACS Entered By: Duanne Guess on 02/27/2022 15:53:27 -------------------------------------------------------------------------------- Physician Orders Details Patient Name: Date of Service: [name], [name] 02/27/2022 2:45 PM Medical Record Number: [id number] Patient Account Number: [id number] Date of Birth/Sex: Treating RN: Jul 24, 1960 (62 y.o. Dianna Limbo Primary Care Provider: Aliene Beams Other Clinician: Referring Provider: Treating Provider/Extender: Karna Dupes in Treatment: 4 Verbal / Phone Orders: No Diagnosis Coding ICD-10 Coding Code Description L89.154 Pressure ulcer of sacral region, 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, stage 4 G80.9 Cerebral palsy, unspecified M86.9 Osteomyelitis, unspecified Follow-up Appointments ppointment in 2 weeks. - Dr. Lady Gary Room 3 Return A ***HOYER*** EXTRA TIME Anesthetic Wound #1 Coccyx (In clinic) Topical Lidocaine 5% applied to wound bed - in clinic, prior to debridement Wound #2 Left Ischial Tuberosity (In clinic) Topical Lidocaine 5% applied to wound bed - in clinic, prior to debridement Bathing/ Shower/ Hygiene May shower and wash wound with soap and water. Wound Treatment Wound #1 - Coccyx Cleanser: Soap and Water 1 x Per Day/30 Days SANDOR, ARBOLEDA ([id number]) [id number] Page 3 of 8 Discharge Instructions: May shower and wash wound with dial antibacterial soap and water prior to dressing change. Peri-Wound Care: Skin Prep 1 x Per Day/30 Days Discharge Instructions: Use skin prep as directed Peri-Wound Care: Zinc Oxide Ointment 30g tube 1 x Per Day/30 Days Discharge Instructions: Apply Zinc Oxide to  periwound with each dressing change Prim Dressing: MediHoney Gel, tube 1.5 (oz) 1 x Per Day/30 Days ary Discharge Instructions: Apply to wound bed as instructed Secondary Dressing: Zetuvit Plus Silicone Border Sacrum Dressing, Sm, 7x7 (in/in) 1 x Per Day/30 Days Discharge Instructions: Apply silicone border over primary dressing as directed. Wound #2 - Ischial Tuberosity Wound Laterality: Left Cleanser: Soap and Water 1 x Per Day/30 Days Discharge Instructions: May shower and wash wound with dial antibacterial soap and water prior to dressing change. Peri-Wound Care: Skin Prep 1 x Per Day/30 Days Discharge Instructions: Use skin prep as directed Peri-Wound Care: Zinc Oxide Ointment 30g tube 1 x Per Day/30 Days Discharge Instructions: Apply Zinc Oxide to periwound with each dressing change Prim Dressing: MediHoney Gel, tube 1.5 (oz) 1 x Per Day/30 Days ary Discharge Instructions: Apply to wound bed as instructed Secondary Dressing: Zetuvit Plus Silicone Border Dressing 5x5 (in/in) 1 x Per Day/30 Days Discharge Instructions: Apply silicone border over primary dressing as directed. Secured With: 17M Medipore Scientist, research (life sciences) Surgical T 2x10 (in/yd) 1 x Per Day/30 Days ape Discharge Instructions: Secure with tape as directed. Electronic Signature(s) Signed: 02/27/2022 3:55:51 PM By: Duanne Guess MD FACS Entered By: Duanne Guess on 02/27/2022 15:54:11 -------------------------------------------------------------------------------- Problem List Details Patient Name: Date of Service: [name], [name] 02/27/2022 2:45 PM Medical Record Number: [id number] Patient Account Number: [id number] Date of Birth/Sex: Treating RN: 04/25/1960 (62 y.o. M) Primary Care Provider: Aliene Beams Other Clinician: Referring Provider: Treating Provider/Extender: Karna Dupes in Treatment: 4 Active Problems ICD-10 Encounter Code Description Active Date MDM Diagnosis L89.154 Pressure  ulcer of sacral region, stage 4 01/30/2022 No Yes 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, stage 4 01/30/2022 No Yes G80.9 Cerebral palsy, unspecified 01/30/2022 No Yes M86.9 Osteomyelitis, unspecified 01/30/2022 No Yes Flatt, Jonavon L ([id number]) [id number] Page 4 of 8 Inactive Problems Resolved Problems Electronic Signature(s) Signed: 02/27/2022 3:51:28 PM By: Duanne Guess MD FACS Entered By: Duanne Guess on 02/27/2022 15:51:28 -------------------------------------------------------------------------------- Progress Note Details Patient Name: Date of Service: [name], [name]  02/27/2022 2:45 PM Medical Record Number: [id number] Patient Account Number: [id number] Date of Birth/Sex: Treating RN: 08/23/1960 (62 y.o. M) Primary Care Provider: Aliene Beams Other Clinician: Referring Provider: Treating Provider/Extender: Karna Dupes in Treatment: 4 Subjective Chief Complaint Information obtained from Patient Patient is at the clinic for treatment of open pressure ulcers History of Present Illness (HPI) ADMISSION 01/30/2022 This is a 62 year old man with cerebral palsy. He is nonverbal at baseline. He is accompanied by his sister, who is his primary caregiver, and his aunt. He was admitted to the hospital on November 11 with urinary retention and was identified as having a left gluteal pressure ulcer and a sacral pressure ulcer. His sister says that the wounds have been there for about a month. A CT scan of the abdomen and pelvis was performed that was suggestive of osteomyelitis. There was also concern for infection of his wound. He was placed on antibiotics. General surgery was consulted and did not think there was any role for surgical intervention. They have been managing the wounds at home with topical Medihoney and then packing the cavities with saline moistened gauze. A Roho cushion has already been ordered for his wheelchair  and they are in the process of obtaining a low-air-loss mattress bed for him. The ulcer over his sacrum is fairly small with just a little bit of depth to it. There is good granulation tissue present with just a bit of slough in the base. The ischial ulcer is more extensive and there is tendon exposed at the base. There is also evidence of ongoing pressure-induced tissue injury. 02/13/2022: Both wounds are smaller today. There is still a fair amount of depth to the ischial ulcer. The sacral is fairly shallow. There is hypertrophic granulation tissue at both sites. I do not see any further evidence of pressure induced tissue injury. 02/27/2022: The sacral ulcer is almost completely closed. There is a pinhole opening remaining. The ischial ulcer has filled in considerably and I do not appreciate exposed tendon any longer. The hypertrophic granulation tissue has not reaccumulated. No concern for infection. Patient History Information obtained from Patient. Family History Cancer - Father,Maternal Grandparents, Diabetes - Mother, Heart Disease - Mother, Hypertension - Siblings,Father,Mother, No family history of Hereditary Spherocytosis, Kidney Disease, Lung Disease, Seizures, Stroke, Thyroid Problems, Tuberculosis. Social History Never smoker, Marital Status - Single, Alcohol Use - Never, Drug Use - No History, Caffeine Use - Daily. Medical History Eyes Denies history of Cataracts, Glaucoma, Optic Neuritis Ear/Nose/Mouth/Throat Denies history of Chronic sinus problems/congestion, Middle ear problems Hematologic/Lymphatic Denies history of Anemia, Hemophilia, Human Immunodeficiency Virus, Lymphedema, Sickle Cell Disease Respiratory Denies history of Aspiration, Asthma, Chronic Obstructive Pulmonary Disease (COPD), Pneumothorax, Sleep Apnea, Tuberculosis Cardiovascular Denies history of Angina, Arrhythmia, Congestive Heart Failure, Coronary Artery Disease, Deep Vein Thrombosis, Hypertension,  Hypotension, Myocardial Infarction, Peripheral Arterial Disease, Peripheral Venous Disease, Phlebitis, Vasculitis Endocrine Denies history of Type I Diabetes, Type II Diabetes Integumentary (Skin) Denies history of History of Burn Oncologic Denies history of Received Chemotherapy, Received Radiation Psychiatric TSUTOMU, BARFOOT ([id number]) [id number] Page 5 of 8 Denies history of Anorexia/bulimia Medical A Surgical History Notes nd Genitourinary nephrolithiasis nonfunctioning kidney Neurologic cerebral palsy Objective Constitutional no acute distress. Vitals Time Taken: 2:30 AM, Height: 68 in, Weight: 115 lbs, BMI: 17.5, Temperature: 98.1 F, Pulse: 81 bpm, Respiratory Rate: 20 breaths/min, Blood Pressure: 120/84 mmHg. Respiratory Normal work of breathing on room air. General Notes: 02/27/2022: The sacral ulcer is almost completely closed. There is a pinhole  opening remaining. The ischial ulcer has filled in considerably and I do not appreciate exposed tendon any longer. The hypertrophic granulation tissue has not reaccumulated. No concern for infection. Integumentary (Hair, Skin) Wound #1 status is Open. Original cause of wound was Pressure Injury. The date acquired was: 11/30/2021. The wound has been in treatment 4 weeks. The wound is located on the Coccyx. The wound measures 0.1cm length x 0.1cm width x 0.1cm depth; 0.008cm^2 area and 0.001cm^3 volume. There is Fat Layer (Subcutaneous Tissue) exposed. There is no tunneling or undermining noted. There is a medium amount of serous drainage noted. There is large (67-100%) pink granulation within the wound bed. There is a small (1-33%) amount of necrotic tissue within the wound bed including Adherent Slough. The periwound skin appearance had no abnormalities noted for color. The periwound skin appearance exhibited: Rash, Dry/Scaly. Periwound temperature was noted as No Abnormality. Wound #2 status is Open.  Original cause of wound was Pressure Injury. The date acquired was: 11/30/2021. The wound has been in treatment 4 weeks. The wound is located on the Left Ischial Tuberosity. The wound measures 2.8cm length x 1.2cm width x 0.9cm depth; 2.639cm^2 area and 2.375cm^3 volume. There is Fat Layer (Subcutaneous Tissue) exposed. There is undermining starting at 9:00 and ending at 1:00 with a maximum distance of 3.4cm. There is a medium amount of serous drainage noted. There is large (67-100%) red granulation within the wound bed. There is a small (1-33%) amount of necrotic tissue within the wound bed including Adherent Slough. The periwound skin appearance exhibited: Excoriation, Erythema. The periwound skin appearance did not exhibit: Dry/Scaly, Maceration. The surrounding wound skin color is noted with erythema. Periwound temperature was noted as No Abnormality. 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 - Dr. Lady Garyannon Room 3 ***HOYER*** EXTRA TIME Anesthetic: Wound #1 Coccyx: (In clinic) Topical Lidocaine 5% applied to wound bed - in clinic, prior to debridement Wound #2 Left Ischial Tuberosity: (In clinic) Topical Lidocaine 5% applied to wound bed - in clinic, prior to debridement Bathing/ Shower/ Hygiene: May shower and wash wound with soap and water. WOUND #1: - Coccyx Wound Laterality: Cleanser: Soap and Water 1 x Per Day/30 Days Discharge Instructions: May shower and wash wound with dial antibacterial soap and water prior to dressing change. Peri-Wound Care: Skin Prep 1 x Per Day/30 Days Discharge Instructions: Use skin prep as directed Peri-Wound Care: Zinc Oxide Ointment 30g tube 1 x Per Day/30 Days Discharge Instructions: Apply Zinc Oxide to periwound with each dressing change Prim Dressing: MediHoney  Gel, tube 1.5 (oz) 1 x Per Day/30 Days ary Discharge Instructions: Apply to wound bed as instructed Secondary Dressing: Zetuvit Plus Silicone Border Sacrum Dressing, Sm, 7x7 (in/in) 1 x Per Day/30 Days Geanie KenningBATES, Treylan L ([id number]) [id number] Page 6 of 8 Discharge Instructions: Apply silicone border over primary dressing as directed. WOUND #2: - Ischial Tuberosity Wound Laterality: Left Cleanser: Soap and Water 1 x Per Day/30 Days Discharge Instructions: May shower and wash wound with dial antibacterial soap and water prior to dressing change. Peri-Wound Care: Skin Prep 1 x Per Day/30 Days Discharge Instructions: Use skin prep as directed Peri-Wound Care: Zinc Oxide Ointment 30g tube 1 x Per Day/30 Days Discharge Instructions: Apply Zinc Oxide to periwound with each dressing change Prim Dressing: MediHoney Gel, tube 1.5 (oz) 1 x Per Day/30 Days ary Discharge Instructions:  Apply to wound bed as instructed Secondary Dressing: Zetuvit Plus Silicone Border Dressing 5x5 (in/in) 1 x Per Day/30 Days Discharge Instructions: Apply silicone border over primary dressing as directed. Secured With: 37M Medipore Public affairs consultant Surgical T 2x10 (in/yd) 1 x Per Day/30 Days ape Discharge Instructions: Secure with tape as directed. 02/27/2022: The sacral ulcer is almost completely closed. There is a pinhole opening remaining. The ischial ulcer has filled in considerably and I do not appreciate exposed tendon any longer. The hypertrophic granulation tissue has not reaccumulated. No concern for infection. No debridement was necessary today. We will continue to apply Medihoney to both wound surfaces. Cover of the sacral ulcer with a foam border dressing. I anticipate that this wound will be healed at our next visit. Pack the ischial ulcer with saline moistened gauze. Follow-up in 2 weeks. Electronic Signature(s) Signed: 02/27/2022 3:55:12 PM By: Fredirick Maudlin MD FACS Entered By: Fredirick Maudlin on 02/27/2022 15:55:12 -------------------------------------------------------------------------------- HxROS Details Patient Name: Date of Service: [name], [name] 02/27/2022 2:45 PM Medical Record Number: [id number] Patient Account Number: [id number] Date of Birth/Sex: Treating RN: Jul 07, 1960 (62 y.o. M) Primary Care Provider: Caren Macadam Other Clinician: Referring Provider: Treating Provider/Extender: Ellis Parents in Treatment: 4 Information Obtained From Patient Eyes Medical History: Negative for: Cataracts; Glaucoma; Optic Neuritis Ear/Nose/Mouth/Throat Medical History: Negative for: Chronic sinus problems/congestion; Middle ear problems Hematologic/Lymphatic Medical History: Negative for: Anemia; Hemophilia; Human Immunodeficiency Virus; Lymphedema; Sickle Cell Disease Respiratory Medical History: Negative for: Aspiration; Asthma; Chronic Obstructive Pulmonary Disease (COPD); Pneumothorax; Sleep Apnea; Tuberculosis Cardiovascular Medical History: Negative for: Angina; Arrhythmia; Congestive Heart Failure; Coronary Artery Disease; Deep Vein Thrombosis; Hypertension; Hypotension; Myocardial Infarction; Peripheral Arterial Disease; Peripheral Venous Disease; Phlebitis; Vasculitis Endocrine Medical History: Negative for: Type I Diabetes; Type II Diabetes Leggette, Benino L ([id number]) [id number] Page 7 of 8 Genitourinary Medical History: Past Medical History Notes: nephrolithiasis nonfunctioning kidney Integumentary (Skin) Medical History: Negative for: History of Burn Neurologic Medical History: Past Medical History Notes: cerebral palsy Oncologic Medical History: Negative for: Received Chemotherapy; Received Radiation Psychiatric Medical History: Negative for: Anorexia/bulimia Immunizations Pneumococcal Vaccine: Received Pneumococcal Vaccination: Yes Received Pneumococcal Vaccination On or After  60th Birthday: No Implantable Devices None Family and Social History Cancer: Yes - Father,Maternal Grandparents; Diabetes: Yes - Mother; Heart Disease: Yes - Mother; Hereditary Spherocytosis: No; Hypertension: Yes - Siblings,Father,Mother; Kidney Disease: No; Lung Disease: No; Seizures: No; Stroke: No; Thyroid Problems: No; Tuberculosis: No; Never smoker; Marital Status - Single; Alcohol Use: Never; Drug Use: No History; Caffeine Use: Daily; Financial Concerns: No; Food, Clothing or Shelter Needs: No; Support System Lacking: No; Transportation Concerns: No Electronic Signature(s) Signed: 02/27/2022 3:55:51 PM By: Fredirick Maudlin MD FACS Entered By: Fredirick Maudlin on 02/27/2022 15:52:58 -------------------------------------------------------------------------------- SuperBill Details Patient Name: Date of Service: [name], [name] 02/27/2022 Medical Record Number: [id number] Patient Account Number: [id number] Date of Birth/Sex: Treating RN: [birth year] (62 y.o. M) Primary Care Provider: Caren Macadam Other Clinician: Referring Provider: Treating Provider/Extender: Ellis Parents in Treatment: 4 Diagnosis Coding ICD-10 Codes Code Description [phone] Pressure ulcer of sacral region, 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, stage 4 G80.9 Cerebral palsy, unspecified M86.9 Osteomyelitis, unspecified [place], Graylon L ([id number]) [id number] Page 8 of 8 Facility Procedures : CPT4 Code: [id number] Description: 99213 - WOUND CARE VISIT-LEV 3 EST PT Modifier: Quantity: 1 Physician Procedures : CPT4 Code Description Modifier [id number] - WC PHYS LEVEL 4 - EST PT ICD-10 Diagnosis Description L89.154 Pressure ulcer of sacral  region, 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, stage 4 G80.9 Cerebral palsy, unspecified M86.9 Osteomyelitis,  unspecified Quantity: 1 Electronic Signature(s) Signed: 02/27/2022 4:42:06 PM By: Dellie Catholic RN Signed: 02/28/2022 7:32:36 AM By: Fredirick Maudlin MD FACS Previous Signature: 02/27/2022 3:55:29 PM Version By: Fredirick Maudlin MD FACS Entered By: Dellie Catholic on 02/27/2022 16:28:12

## 2022-02-28 NOTE — Progress Notes (Signed)
TYSEAN, VANDERVLIET ([id number]) [id number] Page 1 of 10 Visit Report for 02/27/2022 Arrival Information Details Patient Name: Date of Service: [place], [place] 02/27/2022 2:45 PM Medical Record Number: [id number] Patient Account Number: [id number] Date of Birth/Sex: Treating RN: 1960-03-22 (62 y.o. M) Primary Care Tymika Grilli: Aliene Beams Other Clinician: Referring Vinaya Sancho: Treating Yenni Carra/Extender: Karna Dupes in Treatment: 4 Visit Information History Since Last Visit All ordered tests and consults were completed: No Patient Arrived: Wheel Chair Added or deleted any medications: No Arrival Time: 14:29 Any new allergies or adverse reactions: No Accompanied By: sister Had a fall or experienced change in No Transfer Assistance: Nurse, adult activities of daily living that may affect Patient Identification Verified: Yes risk of falls: Secondary Verification Process Completed: Yes Signs or symptoms of abuse/neglect since last visito No Patient Requires Transmission-Based Precautions: No Hospitalized since last visit: No Patient Has Alerts: No Implantable device outside of the clinic excluding No cellular tissue based products placed in the center since last visit: Pain Present Now: No Electronic Signature(s) Signed: 02/27/2022 2:41:36 PM By: Dayton Scrape Entered By: Dayton Scrape on 02/27/2022 14:29:38 -------------------------------------------------------------------------------- Clinic Level of Care Assessment Details Patient Name: Date of Service: Retta Diones 02/27/2022 2:45 PM Medical Record Number: [id number] Patient Account Number: [id number] Date of Birth/Sex: Treating RN: 26-Nov-1960 (62 y.o. Dianna Limbo Primary Care Nahome Bublitz: Aliene Beams Other Clinician: Referring Kynleigh Artz: Treating Makinze Jani/Extender: Karna Dupes in Treatment: 4 Clinic Level of Care Assessment Items TOOL 4 Quantity  Score X- 1 0 Use when only an EandM is performed on FOLLOW-UP visit ASSESSMENTS - Nursing Assessment / Reassessment X- 1 10 Reassessment of Co-morbidities (includes updates in patient status) X- 1 5 Reassessment of Adherence to Treatment Plan ASSESSMENTS - Wound and Skin A ssessment / Reassessment X - Simple Wound Assessment / Reassessment - one wound 1 5 []  - 0 Complex Wound Assessment / Reassessment - multiple wounds []  - 0 Dermatologic / Skin Assessment (not related to wound area) ASSESSMENTS - Focused Assessment []  - 0 Circumferential Edema Measurements - multi extremities []  - 0 Nutritional Assessment / Counseling / Intervention CAYDAN, MCTAVISH ( ) [id number] Page 2 of 10 []  - 0 Lower Extremity Assessment (monofilament, tuning fork, pulses) []  - 0 Peripheral Arterial Disease Assessment (using hand held doppler) ASSESSMENTS - Ostomy and/or Continence Assessment and Care []  - 0 Incontinence Assessment and Management []  - 0 Ostomy Care Assessment and Management (repouching, etc.) PROCESS - Coordination of Care X - Simple Patient / Family Education for ongoing care 1 15 []  - 0 Complex (extensive) Patient / Family Education for ongoing care X- 1 10 Staff obtains , Records, T Results / Process Orders est X- 1 10 Staff telephones HHA, Nursing Homes / Clarify orders / etc []  - 0 Routine Transfer to another Facility (non-emergent condition) []  - 0 Routine Hospital Admission (non-emergent condition) []  - 0 New Admissions / Geanie Kenning / Ordering NPWT Apligraf, etc. , []  - 0 Emergency Hospital Admission (emergent condition) X- 1 10 Simple Discharge Coordination []  - 0 Complex (extensive) Discharge Coordination PROCESS - Special Needs []  - 0 Pediatric / Minor Patient Management []  - 0 Isolation Patient Management []  - 0 Hearing / Language / Visual special needs []  - 0 Assessment of Community assistance  (transportation, D/C planning, etc.) []  - 0 Additional assistance / Altered mentation []  - 0 Support Surface(s) Assessment (bed, cushion, seat, etc.) INTERVENTIONS - Wound Cleansing / Measurement X - Simple Wound  Cleansing - one wound 1 5 []  - 0 Complex Wound Cleansing - multiple wounds X- 1 5 Wound Imaging (photographs - any number of wounds) []  - 0 Wound Tracing (instead of photographs) X- 1 5 Simple Wound Measurement - one wound []  - 0 Complex Wound Measurement - multiple wounds INTERVENTIONS - Wound Dressings X - Small Wound Dressing one or multiple wounds 1 10 []  - 0 Medium Wound Dressing one or multiple wounds []  - 0 Large Wound Dressing one or multiple wounds []  - 0 Application of Medications - topical []  - 0 Application of Medications - injection INTERVENTIONS - Miscellaneous []  - 0 External ear exam []  - 0 Specimen Collection (cultures, biopsies, blood, body fluids, etc.) []  - 0 Specimen(s) / Culture(s) sent or taken to Lab for analysis []  - 0 Patient Transfer (multiple staff / Civil Service fast streamer / Similar devices) []  - 0 Simple Staple / Suture removal (25 or less) []  - 0 Complex Staple / Suture removal (26 or more) []  - 0 Hypo / Hyperglycemic Management (close monitor of Blood Glucose) Bastien, Compton L ([id number]) [id number] Page 3 of 10 []  - 0 Ankle / Brachial Index (ABI) - do not check if billed separately X- 1 5 Vital Signs Has the patient been seen at the hospital within the last three years: Yes Total Score: 95 Level Of Care: New/Established - Level 3 Electronic Signature(s) Signed: 02/27/2022 4:42:06 PM By: Dellie Catholic RN Entered By: Dellie Catholic on 02/27/2022 16:27:53 -------------------------------------------------------------------------------- Encounter Discharge Information Details Patient Name: Date of Service: [name], [name] 02/27/2022 2:45 PM Medical Record Number: [id number] Patient Account Number:  [id number] Date of Birth/Sex: Treating RN: 22-Dec-1960 (62 y.o. Collene Gobble Primary Care Zaylin Runco: Caren Macadam Other Clinician: Referring Faithlyn Recktenwald: Treating Jedaiah Rathbun/Extender: Ellis Parents in Treatment: 4 Encounter Discharge Information Items Discharge Condition: Stable Ambulatory Status: Wheelchair Discharge Destination: Home Transportation: Private Auto Accompanied By: sister Schedule Follow-up Appointment: Yes Clinical Summary of Care: Patient Declined Electronic Signature(s) Signed: 02/27/2022 4:42:06 PM By: Dellie Catholic RN Entered By: Dellie Catholic on 02/27/2022 16:34:48 -------------------------------------------------------------------------------- Lower Extremity Assessment Details Patient Name: Date of Service: [name], [name] 02/27/2022 2:45 PM Medical Record Number: [id number] Patient Account Number: [id number] Date of Birth/Sex: Treating RN: 1960/10/24 (62 y.o. M) Primary Care Jamas Jaquay: Caren Macadam Other Clinician: Referring Tanyiah Laurich: Treating Isaish Alemu/Extender: Ellis Parents in Treatment: 4 Electronic Signature(s) Signed: 02/27/2022 3:06:19 PM By: Worthy Rancher Entered By: Worthy Rancher on 02/27/2022 14:47:15 -------------------------------------------------------------------------------- Multi Wound Chart Details Patient Name: Date of Service: [place] TES, [name] 02/27/2022 2:45 PM Medical Record Number: [id number] Patient Account Number: [id number] KRISH, BAILLY ([id number]) [phone].pdf Page 4 of 10 Date of Birth/Sex: Treating RN: 12/27/1960 (62 y.o. M) Primary Care Jacobi Ryant: Other Clinician: Caren Macadam Referring Nella Botsford: Treating Paralee Pendergrass/Extender: Ellis Parents in Treatment: 4 Vital Signs Height(in): 68 Pulse(bpm): 81 Weight(lbs): 115 Blood Pressure(mmHg): 120/84 Body Mass Index(BMI): 17.5 Temperature(F): 98.1 Respiratory Rate(breaths/min):  20 [1:Photos:] [N/A:N/A] Coccyx Left Ischial Tuberosity N/A Wound Location: Pressure Injury Pressure Injury N/A Wounding Event: Acute Osteomyelitis Acute Osteomyelitis N/A Primary Etiology: 11/30/2021 11/30/2021 N/A Date Acquired: 4 4 N/A Weeks of Treatment: Open Open N/A Wound Status: No No N/A Wound Recurrence: 0.1x0.1x0.1 2.8x1.2x0.9 N/A Measurements L x W x D (cm) 0.008 2.639 N/A A (cm) : rea 0.001 2.375 N/A Volume (cm) : 99.80% 87.60% N/A % Reduction in A rea: 99.90% 92.50% N/A % Reduction in Volume: 9 Starting Position 1 (o'clock): 1 Ending Position  1 (o'clock): 3.4 Maximum Distance 1 (cm): No Yes N/A Undermining: Full Thickness Without Exposed Full Thickness Without Exposed N/A Classification: Support Structures Support Structures Medium Medium N/A Exudate Amount: Serous Serous N/A Exudate Type: amber amber N/A Exudate Color: Large (67-100%) Large (67-100%) N/A Granulation Amount: Pink Red N/A Granulation Quality: Small (1-33%) Small (1-33%) N/A Necrotic Amount: Fat Layer (Subcutaneous Tissue): Yes Fat Layer (Subcutaneous Tissue): Yes N/A Exposed Structures: Fascia: No Fascia: No Tendon: No Tendon: No Muscle: No Muscle: No Joint: No Joint: No Bone: No Bone: No None N/A N/A Epithelialization: Rash: Yes Excoriation: Yes N/A Periwound Skin Texture: Dry/Scaly: Yes Maceration: No N/A Periwound Skin Moisture: Dry/Scaly: No No Abnormalities Noted Erythema: Yes N/A Periwound Skin Color: No Abnormality No Abnormality N/A Temperature: Treatment Notes Electronic Signature(s) Signed: 02/27/2022 3:51:36 PM By: Duanne Guess MD FACS Entered By: Duanne Guess on 02/27/2022 15:51:36 -------------------------------------------------------------------------------- Multi-Disciplinary Care Plan Details Patient Name: Date of Service: [name], [name] 02/27/2022 2:45 PM Geanie Kenning ([id number]) [id number] Page 5 of  10 Medical Record Number: [id number] Patient Account Number: [id number] Date of Birth/Sex: Treating RN: 14-May-1960 (62 y.o. Dianna Limbo Primary Care Jaymen Fetch: Aliene Beams Other Clinician: Referring Suvan Stcyr: Treating Azeem Poorman/Extender: Karna Dupes in Treatment: 4 Active Inactive Orientation to the Wound Care Program Nursing Diagnoses: Knowledge deficit related to the wound healing center program Goals: Patient/caregiver will verbalize understanding of the Wound Healing Center Program Date Initiated: 01/30/2022 Target Resolution Date: 06/24/2022 Goal Status: Active Interventions: Provide education on orientation to the wound center Notes: Osteomyelitis Nursing Diagnoses: Infection: osteomyelitis Goals: Patient/caregiver will verbalize understanding of disease process and disease management Date Initiated: 01/30/2022 Target Resolution Date: 06/24/2022 Goal Status: Active Interventions: Assess for signs and symptoms of osteomyelitis resolution every visit Provide education on osteomyelitis Treatment Activities: MRI : 01/20/2022 Surgical debridement : 01/30/2022 Notes: Wound/Skin Impairment Nursing Diagnoses: Impaired tissue integrity Goals: Ulcer/skin breakdown will have a volume reduction of 30% by week 4 Date Initiated: 01/30/2022 Target Resolution Date: 06/24/2022 Goal Status: Active Interventions: Assess patient/caregiver ability to obtain necessary supplies Assess ulceration(s) every visit Treatment Activities: Skin care regimen initiated : 01/30/2022 Notes: Electronic Signature(s) Signed: 02/27/2022 4:42:06 PM By: Karie Schwalbe RN Entered By: Karie Schwalbe on 02/27/2022 16:24:06 Pain Assessment Details -------------------------------------------------------------------------------- Geanie Kenning ([id number]) [id number] Page 6 of 10 Patient Name: Date of Service: [name], [place] 02/27/2022 2:45  PM Medical Record Number: [id number] Patient Account Number: [id number] Date of Birth/Sex: Treating RN: April 29, 1960 (62 y.o. M) Primary Care Korver Graybeal: Aliene Beams Other Clinician: Referring Kahleel Fadeley: Treating Ivania Teagarden/Extender: Karna Dupes in Treatment: 4 Active Problems Location of Pain Severity and Description of Pain Patient Has Paino No Site Locations Pain Management and Medication Current Pain Management: Electronic Signature(s) Signed: 02/27/2022 2:41:36 PM By: Dayton Scrape Entered By: Dayton Scrape on 02/27/2022 14:30:12 -------------------------------------------------------------------------------- Patient/Caregiver Education Details Patient Name: Date of Service: [name], RO Yetta Flock 1/4/2024andnbsp2:45 PM Medical Record Number: [id number] Patient Account Number: [id number] Date of Birth/Gender: Treating RN: September 27, 1960 (62 y.o. Dianna Limbo Primary Care Physician: Aliene Beams Other Clinician: Referring Physician: Treating Physician/Extender: Karna Dupes in Treatment: 4 Education Assessment Education Provided To: Patient Education Topics Provided Wound/Skin Impairment: Methods: Explain/Verbal Responses: Return demonstration correctly Electronic Signature(s) Signed: 02/27/2022 4:42:06 PM By: Karie Schwalbe RN Entered By: Karie Schwalbe on 02/27/2022 16:24:56 Stallworth, [name] ([id number]) [id number] Page 7 of 10 -------------------------------------------------------------------------------- Wound Assessment Details Patient Name: Date of Service: [name], [name] 02/27/2022 2:45 PM  Medical Record Number: [id number] Patient Account Number: [id number] Date of Birth/Sex: Treating RN: 06/24/1960 (62 y.o. M) Primary Care Juvenal Umar: Aliene Beams Other Clinician: Referring Morghan Kester: Treating Daniyal Tabor/Extender: Karna Dupes in Treatment: 4 Wound Status Wound Number:  1 Primary Etiology: Acute Osteomyelitis Wound Location: Coccyx Wound Status: Open Wounding Event: Pressure Injury Date Acquired: 11/30/2021 Weeks Of Treatment: 4 Clustered Wound: No Photos Wound Measurements Length: (cm) 0 Width: (cm) 0 Depth: (cm) 0 Area: (cm) Volume: (cm) .1 % Reduction in Area: 99.8% .1 % Reduction in Volume: 99.9% .1 Epithelialization: None 0.008 Tunneling: No 0.001 Undermining: No Wound Description Classification: Full Thickness Without Exposed Suppor Exudate Amount: Medium Exudate Type: Serous Exudate Color: amber t Structures Foul Odor After Cleansing: No Wound Bed Granulation Amount: Large (67-100%) Exposed Structure Granulation Quality: Pink Fascia Exposed: No Necrotic Amount: Small (1-33%) Fat Layer (Subcutaneous Tissue) Exposed: Yes Necrotic Quality: Adherent Slough Tendon Exposed: No Muscle Exposed: No Joint Exposed: No Bone Exposed: No Periwound Skin Texture Texture Color No Abnormalities Noted: No No Abnormalities Noted: Yes Rash: Yes Temperature / Pain Temperature: No Abnormality Moisture No Abnormalities Noted: No Dry / Scaly: Yes Treatment Notes Wound #1 (Coccyx) Cleanser Soap and Water Misuraca, Augustus L ([id number]) [phone].pdf Page 8 of 10 Discharge Instruction: May shower and wash wound with dial antibacterial soap and water prior to dressing change. Peri-Wound Care Skin Prep Discharge Instruction: Use skin prep as directed Zinc Oxide Ointment 30g tube Discharge Instruction: Apply Zinc Oxide to periwound with each dressing change Topical Primary Dressing MediHoney Gel, tube 1.5 (oz) Discharge Instruction: Apply to wound bed as instructed Secondary Dressing Zetuvit Plus Silicone Border Sacrum Dressing, Sm, 7x7 (in/in) Discharge Instruction: Apply silicone border over primary dressing as directed. Secured With Compression Wrap Compression Stockings Facilities manager) Signed:  02/27/2022 4:42:06 PM By: Karie Schwalbe RN Entered By: Karie Schwalbe on 02/27/2022 14:54:12 -------------------------------------------------------------------------------- Wound Assessment Details Patient Name: Date of Service: [name], [name] 02/27/2022 2:45 PM Medical Record Number: [id number] Patient Account Number: [id number] Date of Birth/Sex: Treating RN: [birth year] (62 y.o. M) Primary Care Arvil Utz: Aliene Beams Other Clinician: Referring Roarke Marciano: Treating Teona Vargus/Extender: Karna Dupes in Treatment: 4 Wound Status Wound Number: 2 Primary Etiology: Acute Osteomyelitis Wound Location: Left Ischial Tuberosity Wound Status: Open Wounding Event: Pressure Injury Date Acquired: 11/30/2021 Weeks Of Treatment: 4 Clustered Wound: No Photos Wound Measurements Length: (cm) 2.8 Width: (cm) 1.2 Depth: (cm) 0.9 Area: (cm) 2.639 Volume: (cm) 2.375 Culton, Terrie L ([id number]) % Reduction in Area: 87.6% % Reduction in Volume: 92.5% Undermining: Yes Starting Position (o'clock): 9 Ending Position (o'clock): 1 [id number] Page 9 of 10 Maximum Distance: (cm) 3.4 Wound Description Classification: Full Thickness Without Exposed Suppor Exudate Amount: Medium Exudate Type: Serous Exudate Color: amber t Structures Foul Odor After Cleansing: No Slough/Fibrino Yes Wound Bed Granulation Amount: Large (67-100%) Exposed Structure Granulation Quality: Red Fascia Exposed: No Necrotic Amount: Small (1-33%) Fat Layer (Subcutaneous Tissue) Exposed: Yes Necrotic Quality: Adherent Slough Tendon Exposed: No Muscle Exposed: No Joint Exposed: No Bone Exposed: No Periwound Skin Texture Texture Color No Abnormalities Noted: No No Abnormalities Noted: No Excoriation: Yes Erythema: Yes Moisture Temperature / Pain No Abnormalities Noted: No Temperature: No Abnormality Dry / Scaly: No Maceration: No Treatment Notes Wound #2 (Ischial  Tuberosity) Wound Laterality: Left Cleanser Soap and Water Discharge Instruction: May shower and wash wound with dial antibacterial soap and water prior to dressing change. Peri-Wound Care Skin Prep Discharge Instruction: Use skin prep as directed Zinc Oxide  Ointment 30g tube Discharge Instruction: Apply Zinc Oxide to periwound with each dressing change Topical Primary Dressing MediHoney Gel, tube 1.5 (oz) Discharge Instruction: Apply to wound bed as instructed Secondary Dressing Zetuvit Plus Silicone Border Dressing 5x5 (in/in) Discharge Instruction: Apply silicone border over primary dressing as directed. Secured With [employer] Surgical T 2x10 (in/yd) ape Discharge Instruction: Secure with tape as directed. Compression Wrap Compression Stockings Add-Ons Electronic Signature(s) Signed: 02/27/2022 4:42:06 PM By: Dellie Catholic RN Entered By: Dellie Catholic on 02/27/2022 14:54:53 Magowan, Jaymes Graff ([id number]) [id number] Page 10 of 10 -------------------------------------------------------------------------------- Vitals Details Patient Name: Date of Service: [name], Jethro Bolus 02/27/2022 2:45 PM Medical Record Number: [id number] Patient Account Number: [id number] Date of Birth/Sex: Treating RN: 09/18/1960 (62 y.o. M) Primary Care Johnmark Geiger: Caren Macadam Other Clinician: Referring Latresha Yahr: Treating Lenardo Westwood/Extender: Ellis Parents in Treatment: 4 Vital Signs Time Taken: 02:30 Temperature (F): 98.1 Height (in): 68 Pulse (bpm): 81 Weight (lbs): 115 Respiratory Rate (breaths/min): 20 Body Mass Index (BMI): 17.5 Blood Pressure (mmHg): 120/84 Reference Range: 80 - 120 mg / dl Electronic Signature(s) Signed: 02/27/2022 2:41:36 PM By: Worthy Rancher Entered By: Worthy Rancher on 02/27/2022 14:30:05

## 2022-03-03 DIAGNOSIS — Z466 Encounter for fitting and adjustment of urinary device: Secondary | ICD-10-CM | POA: Diagnosis not present

## 2022-03-03 DIAGNOSIS — G808 Other cerebral palsy: Secondary | ICD-10-CM | POA: Diagnosis not present

## 2022-03-03 DIAGNOSIS — L89324 Pressure ulcer of left buttock, stage 4: Secondary | ICD-10-CM | POA: Diagnosis not present

## 2022-03-03 DIAGNOSIS — Z87440 Personal history of urinary (tract) infections: Secondary | ICD-10-CM | POA: Diagnosis not present

## 2022-03-03 DIAGNOSIS — R339 Retention of urine, unspecified: Secondary | ICD-10-CM | POA: Diagnosis not present

## 2022-03-04 DIAGNOSIS — L89324 Pressure ulcer of left buttock, stage 4: Secondary | ICD-10-CM | POA: Diagnosis not present

## 2022-03-09 DIAGNOSIS — L89212 Pressure ulcer of right hip, stage 2: Secondary | ICD-10-CM | POA: Diagnosis not present

## 2022-03-09 DIAGNOSIS — G809 Cerebral palsy, unspecified: Secondary | ICD-10-CM | POA: Diagnosis not present

## 2022-03-12 DIAGNOSIS — G808 Other cerebral palsy: Secondary | ICD-10-CM | POA: Diagnosis not present

## 2022-03-12 DIAGNOSIS — R339 Retention of urine, unspecified: Secondary | ICD-10-CM | POA: Diagnosis not present

## 2022-03-12 DIAGNOSIS — Z466 Encounter for fitting and adjustment of urinary device: Secondary | ICD-10-CM | POA: Diagnosis not present

## 2022-03-12 DIAGNOSIS — L89324 Pressure ulcer of left buttock, stage 4: Secondary | ICD-10-CM | POA: Diagnosis not present

## 2022-03-12 DIAGNOSIS — Z87440 Personal history of urinary (tract) infections: Secondary | ICD-10-CM | POA: Diagnosis not present

## 2022-03-13 ENCOUNTER — Other Ambulatory Visit: Payer: Self-pay | Admitting: Radiology

## 2022-03-13 ENCOUNTER — Encounter (HOSPITAL_BASED_OUTPATIENT_CLINIC_OR_DEPARTMENT_OTHER): Payer: Medicare Other | Admitting: General Surgery

## 2022-03-13 DIAGNOSIS — L89324 Pressure ulcer of left buttock, stage 4: Secondary | ICD-10-CM | POA: Diagnosis not present

## 2022-03-13 DIAGNOSIS — M8618 Other acute osteomyelitis, other site: Secondary | ICD-10-CM | POA: Diagnosis not present

## 2022-03-13 DIAGNOSIS — G809 Cerebral palsy, unspecified: Secondary | ICD-10-CM | POA: Diagnosis not present

## 2022-03-13 DIAGNOSIS — L98412 Non-pressure chronic ulcer of buttock with fat layer exposed: Secondary | ICD-10-CM | POA: Diagnosis not present

## 2022-03-13 DIAGNOSIS — L89154 Pressure ulcer of sacral region, stage 4: Secondary | ICD-10-CM | POA: Diagnosis not present

## 2022-03-13 DIAGNOSIS — M869 Osteomyelitis, unspecified: Secondary | ICD-10-CM | POA: Diagnosis not present

## 2022-03-13 NOTE — Progress Notes (Signed)
[name], [name] ([id number]) [id number] Page 1 of 8 Visit Report for 03/13/2022 Arrival Information Details Patient Name: Date of Service: [place], [place] 03/13/2022 2:45 PM Medical Record Number: [id number] Patient Account Number: [id number] Date of Birth/Sex: Treating RN: 1960/06/22 (62 y.o. Damaris Schooner Primary Care Akeya Ryther: Aliene Beams Other Clinician: Referring Danitza Schoenfeldt: Treating Dorothy Landgrebe/Extender: Karna Dupes in Treatment: 6 Visit Information History Since Last Visit Added or deleted any medications: No Patient Arrived: Wheel Chair Any new allergies or adverse reactions: No Arrival Time: 15:22 Had a fall or experienced change in No Accompanied By: family activities of daily living that may affect Transfer Assistance: Michiel Sites Lift risk of falls: Patient Identification Verified: Yes Signs or symptoms of abuse/neglect since last visito No Secondary Verification Process Completed: Yes Hospitalized since last visit: No Patient Requires Transmission-Based Precautions: No Implantable device outside of the clinic excluding No Patient Has Alerts: No cellular tissue based products placed in the center since last visit: Has Dressing in Place as Prescribed: Yes Pain Present Now: No Electronic Signature(s) Signed: 03/13/2022 5:28:47 PM By: Zenaida Deed RN, BSN Entered By: Zenaida Deed on 03/13/2022 15:23:38 -------------------------------------------------------------------------------- Encounter Discharge Information Details Patient Name: Date of Service: [name], Brett BERT L. 03/13/2022 2:45 PM Medical Record Number: [id number] Patient Account Number: [id number] Date of Birth/Sex: Treating RN: 1960-03-24 (62 y.o. Damaris Schooner Primary Care Carmilla Granville: Aliene Beams Other Clinician: Referring Jenice Leiner: Treating Laqueisha Catalina/Extender: Karna Dupes in Treatment: 6 Encounter Discharge Information  Items Discharge Condition: Stable Ambulatory Status: Wheelchair Discharge Destination: Home Transportation: Private Auto Accompanied By: family Schedule Follow-up Appointment: Yes Clinical Summary of Care: Patient Declined Electronic Signature(s) Signed: 03/13/2022 5:28:47 PM By: Zenaida Deed RN, BSN Entered By: Zenaida Deed on 03/13/2022 15:59:04 [place], Brett Cheadle ([id number]) [id number] Page 2 of 8 -------------------------------------------------------------------------------- Lower Extremity Assessment Details Patient Name: Date of Service: [name] 03/13/2022 2:45 PM Medical Record Number: [id number] Patient Account Number: [id number] Date of Birth/Sex: Treating RN: [birth year] (62 y.o. Damaris Schooner Primary Care Renald Haithcock: Aliene Beams Other Clinician: Referring Rowyn Mustapha: Treating Ravon Mcilhenny/Extender: Karna Dupes in Treatment: 6 Electronic Signature(s) Signed: 03/13/2022 5:28:47 PM By: Zenaida Deed RN, BSN Entered By: Zenaida Deed on 03/13/2022 15:24:26 -------------------------------------------------------------------------------- Multi Wound Chart Details Patient Name: Date of Service: [name], Brett BERT L. 03/13/2022 2:45 PM Medical Record Number: [id number] Patient Account Number: [id number] Date of Birth/Sex: Treating RN: 1960-07-20 (62 y.o. M) Primary Care Diann Bangerter: Aliene Beams Other Clinician: Referring Pantera Winterrowd: Treating Kolston Lacount/Extender: Karna Dupes in Treatment: 6 Vital Signs Height(in): 68 Pulse(bpm): 86 Weight(lbs): 115 Blood Pressure(mmHg): 112/74 Body Mass Index(BMI): 17.5 Temperature(F): 97.5 Respiratory Rate(breaths/min): 18 [1:Photos: No Photos Coccyx Wound Location: Pressure Injury Wounding Event: Acute Osteomyelitis Primary Etiology: 11/30/2021 Date Acquired: 6 Weeks of Treatment: Open Wound Status: No Wound Recurrence: 0x0x0 Measurements L x W x D  (cm) 0 A (cm) : rea 0 Volume (cm)  : 100.00% % Reduction in A rea: 100.00% % Reduction in Volume: Full Thickness Without Exposed Classification: Support Structures None Present Exudate Amount: N/A Exudate Type: N/A Exudate Color: N/A Wound Margin: None Present (0%) Granulation Amount:  N/A Granulation Quality: None Present (0%) Necrotic Amount: Fascia: No Exposed Structures: Fat Layer (Subcutaneous Tissue): No Tendon: No Muscle: No Joint: No Bone: No Large (67-100%) Epithelialization: Rash: Yes Periwound Skin Texture: Dry/Scaly: Yes  Periwound Skin Moisture:] [2:No Photos Left Ischial Tuberosity Pressure Injury Acute Osteomyelitis 11/30/2021 6 Open No 2.7x0.7x0.7 1.484 1.039 93.00% 96.70% Full  Thickness Without Exposed Support Structures Medium Serosanguineous red, brown Distinct,  outline attached Large (67-100%) Red, Hyper-granulation None Present (0%) Fat Layer (Subcutaneous Tissue): Yes N/A Fascia: No Tendon: No Muscle: No Joint: No Bone: No Small (1-33%) Rash: Yes Excoriation: No Maceration: No Dry/Scaly: No] [N/A:N/A N/A N/A  N/A N/A N/A N/A N/A N/A N/A N/A N/A N/A N/A N/A N/A N/A N/A N/A N/A N/A N/A N/A N/A] Wimberley, Waino L ([id number]) [1:No Abnormalities Noted Periwound Skin Color: No Abnormality Temperature: N/A Procedures Performed:] [2:Erythema: Yes No Abnormality Chemical Cauterization] [N/A:[id number] Page 3 of 8 N/A N/A N/A] Treatment Notes Electronic Signature(s) Signed: 03/13/2022 3:55:26 PM By: Duanne Guess MD FACS Entered By: Duanne Guess on 03/13/2022 15:55:26 -------------------------------------------------------------------------------- Multi-Disciplinary Care Plan Details Patient Name: Date of Service: [name], Brett BERT L. 03/13/2022 2:45 PM Medical Record Number: [id number] Patient Account Number: [id number] Date of Birth/Sex: Treating RN: 06-16-1960 (62 y.o. Damaris Schooner Primary Care Camdynn Maranto: Aliene Beams Other Clinician: Referring  Efrat Zuidema: Treating Tatumn Corbridge/Extender: Karna Dupes in Treatment: 6 Multidisciplinary Care Plan reviewed with physician Active Inactive Osteomyelitis Nursing Diagnoses: Infection: osteomyelitis Goals: Patient/caregiver will verbalize understanding of disease process and disease management Date Initiated: 01/30/2022 Target Resolution Date: 06/24/2022 Goal Status: Active Interventions: Assess for signs and symptoms of osteomyelitis resolution every visit Provide education on osteomyelitis Treatment Activities: MRI : 01/20/2022 Surgical debridement : 01/30/2022 Notes: Wound/Skin Impairment Nursing Diagnoses: Impaired tissue integrity Goals: Ulcer/skin breakdown will have a volume reduction of 30% by week 4 Date Initiated: 01/30/2022 Target Resolution Date: 06/24/2022 Goal Status: Active Interventions: Assess patient/caregiver ability to obtain necessary supplies Assess ulceration(s) every visit Treatment Activities: Skin care regimen initiated : 01/30/2022 Notes: Electronic Signature(s) Signed: 03/13/2022 5:28:47 PM By: Zenaida Deed RN, BSN Entered By: Zenaida Deed on 03/13/2022 15:37:07 Wise, Brett Cheadle ([id number]) [id number] Page 4 of 8 -------------------------------------------------------------------------------- Pain Assessment Details Patient Name: Date of Service: [name], [name] 03/13/2022 2:45 PM Medical Record Number: [id number] Patient Account Number: [id number] Date of Birth/Sex: Treating RN: [birth year] (62 y.o. Damaris Schooner Primary Care Conley Delisle: Aliene Beams Other Clinician: Referring Johonna Binette: Treating Vernis Eid/Extender: Karna Dupes in Treatment: 6 Active Problems Location of Pain Severity and Description of Pain Patient Has Paino No Site Locations Rate the pain. Current Pain Level: 0 Pain Management and Medication Current Pain Management: Electronic  Signature(s) Signed: 03/13/2022 5:28:47 PM By: Zenaida Deed RN, BSN Entered By: Zenaida Deed on 03/13/2022 15:24:19 -------------------------------------------------------------------------------- Patient/Caregiver Education Details Patient Name: Date of Service: [name], Brett Yetta Flock 1/18/2024andnbsp2:45 PM Medical Record Number: [id number] Patient Account Number: [id number] Date of Birth/Gender: Treating RN: Jan 20, 1961 (62 y.o. Damaris Schooner Primary Care Physician: Aliene Beams Other Clinician: Referring Physician: Treating Physician/Extender: Karna Dupes in Treatment: 6 Education Assessment Education Provided To: Patient Education Topics Provided Pressure: Methods: Explain/Verbal JURIEL, CID ([id number]) [phone].pdf Page 5 of 8 Responses: Reinforcements needed, State content correctly Wound/Skin Impairment: Methods: Explain/Verbal Responses: Reinforcements needed, State content correctly Electronic Signature(s) Signed: 03/13/2022 5:28:47 PM By: Zenaida Deed RN, BSN Entered By: Zenaida Deed on 03/13/2022 15:37:31 -------------------------------------------------------------------------------- Wound Assessment Details Patient Name: Date of Service: [name], Brett BERT L. 03/13/2022 2:45 PM Medical Record Number: [id number] Patient Account Number: [id number] Date of Birth/Sex: Treating RN: 02/24/1961 (62 y.o. Damaris Schooner Primary Care Cathern Tahir: Aliene Beams Other Clinician: Referring Manly Nestle: Treating Keandra Medero/Extender: Karna Dupes in Treatment: 6 Wound Status Wound Number: 1 Primary Etiology: Acute Osteomyelitis Wound Location: Coccyx Wound Status: Healed - Epithelialized  Wounding Event: Pressure Injury Date Acquired: 11/30/2021 Weeks Of Treatment: 6 Clustered Wound: No Photos Wound Measurements Length: (cm) Width: (cm) Depth: (cm) Area: (cm) Volume: (cm) 0 %  Reduction in Area: 100% 0 % Reduction in Volume: 100% 0 Epithelialization: Large (67-100%) 0 Tunneling: No 0 Undermining: No Wound Description Classification: Full Thickness Without Exposed Support Exudate Amount: None Present Structures Foul Odor After Cleansing: No Slough/Fibrino No Wound Bed Granulation Amount: None Present (0%) Exposed Structure Necrotic Amount: None Present (0%) Fascia Exposed: No Fat Layer (Subcutaneous Tissue) Exposed: No Tendon Exposed: No Muscle Exposed: No Joint Exposed: No Bone Exposed: No Periwound Skin Texture Texture Color Wise, Brett L ([id number]) [id number] Page 6 of 8 No Abnormalities Noted: No No Abnormalities Noted: Yes Rash: Yes Temperature / Pain Temperature: No Abnormality Moisture No Abnormalities Noted: Yes Treatment Notes Wound #1 (Coccyx) Cleanser Peri-Wound Care Topical Primary Dressing Secondary Dressing Secured With Compression Wrap Compression Stockings Add-Ons Electronic Signature(s) Signed: 03/13/2022 5:28:47 PM By: Baruch Gouty RN, BSN Entered By: Baruch Gouty on 03/13/2022 16:59:06 -------------------------------------------------------------------------------- Wound Assessment Details Patient Name: Date of Service: [name], Brett BERT L. 03/13/2022 2:45 PM Medical Record Number: [id number] Patient Account Number: [id number] Date of Birth/Sex: Treating RN: [birth year] (62 y.o. [name] Primary Care Klynn Linnemann: Caren Macadam Other Clinician: Referring [name]: Treating Briar Sword/Extender: Ellis Parents in Treatment: 6 Wound Status Wound Number: 2 Primary Etiology: Acute Osteomyelitis Wound Location: Left Ischial Tuberosity Wound Status: Open Wounding Event: Pressure Injury Date Acquired: 11/30/2021 Weeks Of Treatment: 6 Clustered Wound: No Photos Wound Measurements Length: (cm) 2.7 Width: (cm) 0.7 Depth: (cm) 0.7 Area: (cm) 1.484 Volume:  (cm) 1.039 Wise, Brett L ([id number]) Wound Description Classification: Full Thickness Without Exposed Suppor Wound Margin: Distinct, outline attached Exudate Amount: Medium Exudate Type: Serosanguineous Exudate Color: red, brown t Structures Foul Odor After Cleansing: Slough/Fibrino % Reduction in Area: 93% % Reduction in Volume: 96.7% Epithelialization: Small (1-33%) Tunneling: No Undermining: No [id number] Page 7 of 8 No No Wound Bed Granulation Amount: Large (67-100%) Exposed Structure Granulation Quality: Red, Hyper-granulation Fascia Exposed: No Necrotic Amount: None Present (0%) Fat Layer (Subcutaneous Tissue) Exposed: Yes Tendon Exposed: No Muscle Exposed: No Joint Exposed: No Bone Exposed: No Periwound Skin Texture Texture Color No Abnormalities Noted: No No Abnormalities Noted: No Excoriation: No Erythema: Yes Rash: Yes Temperature / Pain Temperature: No Abnormality Moisture No Abnormalities Noted: No Dry / Scaly: No Maceration: No Treatment Notes Wound #2 (Ischial Tuberosity) Wound Laterality: Left Cleanser Soap and Water Discharge Instruction: May shower and wash wound with dial antibacterial soap and water prior to dressing change. Peri-Wound Care Skin Prep Discharge Instruction: Use skin prep as directed Zinc Oxide Ointment 30g tube Discharge Instruction: Apply Zinc Oxide to periwound with each dressing change as needed Topical Primary Dressing Sorbalgon AG Dressing 2x2 (in/in) Discharge Instruction: Apply to wound bed , be sure to tuck into wound Secondary Dressing Zetuvit Plus Silicone Border Dressing 5x5 (in/in) Discharge Instruction: Apply silicone border over primary dressing as directed. Secured With [employer] Surgical T 2x10 (in/yd) ape Discharge Instruction: Secure with tape as directed. Compression Wrap Compression Stockings Add-Ons Electronic Signature(s) Signed: 03/13/2022 5:28:47 PM By:  Baruch Gouty RN, BSN Entered By: Baruch Gouty on 03/13/2022 16:58:43 Vitals Details -------------------------------------------------------------------------------- [name] ([id number]) [id number] Page 8 of 8 Patient Name: Date of Service: [name], [place] 03/13/2022 2:45 PM Medical Record Number: [id number] Patient Account Number: [id number] Date of Birth/Sex: Treating RN: 1960/10/25 (62 y.o. [name]  Primary Care Areli Jowett: Caren Macadam Other Clinician: Referring Sophie Tamez: Treating Sham Alviar/Extender: Ellis Parents in Treatment: 6 Vital Signs Time Taken: 15:23 Temperature (F): 97.5 Height (in): 68 Pulse (bpm): 86 Weight (lbs): 115 Respiratory Rate (breaths/min): 18 Body Mass Index (BMI): 17.5 Blood Pressure (mmHg): 112/74 Reference Range: 80 - 120 mg / dl Electronic Signature(s) Signed: 03/13/2022 5:28:47 PM By: Baruch Gouty RN, BSN Entered By: Baruch Gouty on 03/13/2022 15:24:12

## 2022-03-14 DIAGNOSIS — L89324 Pressure ulcer of left buttock, stage 4: Secondary | ICD-10-CM | POA: Diagnosis not present

## 2022-03-14 NOTE — Progress Notes (Signed)
EVERTT, CHOUINARD ([id number]) [id number] Page 1 of 8 Visit Report for 03/13/2022 Chief Complaint Document Details Patient Name: Date of Service: [name] 03/13/2022 2:45 PM Medical Record Number: [id number] Patient Account Number: [id number] Date of Birth/Sex: Treating RN: August 21, 1960 (62 y.o. M) Primary Care Provider: Aliene Beams Other Clinician: Referring Provider: Treating Provider/Extender: Karna Dupes in Treatment: 6 Information Obtained from: Patient Chief Complaint Patient is at the clinic for treatment of open pressure ulcers Electronic Signature(s) Signed: 03/13/2022 3:55:32 PM By: Duanne Guess MD FACS Entered By: Duanne Guess on 03/13/2022 15:55:31 -------------------------------------------------------------------------------- HPI Details Patient Name: Date of Service: [name], [name] 03/13/2022 2:45 PM Medical Record Number: [id number] Patient Account Number: [id number] Date of Birth/Sex: Treating RN: 1961/02/07 (62 y.o. M) Primary Care Provider: Aliene Beams Other Clinician: Referring Provider: Treating Provider/Extender: Karna Dupes in Treatment: 6 History of Present Illness HPI Description: ADMISSION 01/30/2022 This is a 62 year old man with cerebral palsy. He is nonverbal at baseline. He is accompanied by his sister, who is his primary caregiver, and his aunt. He was admitted to the hospital on November 11 with urinary retention and was identified as having a left gluteal pressure ulcer and a sacral pressure ulcer. His sister says that the wounds have been there for about a month. A CT scan of the abdomen and pelvis was performed that was suggestive of osteomyelitis. There was also concern for infection of his wound. He was placed on antibiotics. General surgery was consulted and did not think there was any role for surgical intervention. They have been managing the  wounds at home with topical Medihoney and then packing the cavities with saline moistened gauze. A Roho cushion has already been ordered for his wheelchair and they are in the process of obtaining a low-air-loss mattress bed for him. The ulcer over his sacrum is fairly small with just a little bit of depth to it. There is good granulation tissue present with just a bit of slough in the base. The ischial ulcer is more extensive and there is tendon exposed at the base. There is also evidence of ongoing pressure-induced tissue injury. 02/13/2022: Both wounds are smaller today. There is still a fair amount of depth to the ischial ulcer. The sacral is fairly shallow. There is hypertrophic granulation tissue at both sites. I do not see any further evidence of pressure induced tissue injury. 02/27/2022: The sacral ulcer is almost completely closed. There is a pinhole opening remaining. The ischial ulcer has filled in considerably and I do not appreciate exposed tendon any longer. The hypertrophic granulation tissue has not reaccumulated. No concern for infection. 03/13/2022: The sacral ulcer is healed. The ischial ulcer is smaller. There is hypertrophic granulation tissue and there is still some depth to the wound at the most cranial aspect. Electronic Signature(s) Signed: 03/13/2022 3:56:05 PM By: Duanne Guess MD FACS Entered By: Duanne Guess on 03/13/2022 15:56:05 [place], [name] ([id number]) [id number] Page 2 of 8 -------------------------------------------------------------------------------- Chemical Cauterization Details Patient Name: Date of Service: [name], [name] 03/13/2022 2:45 PM Medical Record Number: [id number] Patient Account Number: [id number] Date of Birth/Sex: Treating RN: Dec 16, 1960 (62 y.o. Damaris Schooner Primary Care Provider: Aliene Beams Other Clinician: Referring Provider: Treating Provider/Extender: Karna Dupes  in Treatment: 6 Procedure Performed for: Wound #2 Left Ischial Tuberosity Performed By: Physician Duanne Guess, MD Post Procedure Diagnosis Same as Pre-procedure Notes using silver nitrate stick Electronic Signature(s) Signed: 03/13/2022 5:28:47 PM  By: Baruch Gouty RN, BSN Signed: 03/14/2022 7:36:34 AM By: Fredirick Maudlin MD FACS Entered By: Baruch Gouty on 03/13/2022 15:43:51 -------------------------------------------------------------------------------- Physical Exam Details Patient Name: Date of Service: [name], [name] 03/13/2022 2:45 PM Medical Record Number: [id number] Patient Account Number: [id number] Date of Birth/Sex: Treating RN: 11-17-1960 (62 y.o. M) Primary Care Provider: Caren Macadam Other Clinician: Referring Provider: Treating Provider/Extender: Ellis Parents in Treatment: 6 Constitutional . . . . no acute distress. Respiratory Normal work of breathing on room air. Notes 03/13/2022: The sacral ulcer is healed. The ischial ulcer is smaller. There is hypertrophic granulation tissue and there is still some depth to the wound at the most cranial aspect. Electronic Signature(s) Signed: 03/13/2022 3:56:34 PM By: Fredirick Maudlin MD FACS Entered By: Fredirick Maudlin on 03/13/2022 15:56:34 -------------------------------------------------------------------------------- Physician Orders Details Patient Name: Date of Service: [name], [name] 03/13/2022 2:45 PM Medical Record Number: [id number] Patient Account Number: [id number] Date of Birth/Sex: Treating RN: 05/21/1960 (62 y.o. [name] ([id number]) [id number] Page 3 of 8 Primary Care Provider: Caren Macadam Other Clinician: Referring Provider: Treating Provider/Extender: Ellis Parents in Treatment: 6 Verbal / Phone Orders: No Diagnosis Coding ICD-10 Coding Code Description [phone] Pressure ulcer of  left buttock, stage 4 G80.9 Cerebral palsy, unspecified M86.9 Osteomyelitis, unspecified Follow-up Appointments ppointment in 2 weeks. - Dr. Celine Ahr Room 3 Return A ***HOYER*** Thursday 2/1 @ 3:00 pm Anesthetic Wound #2 Left Ischial Tuberosity (In clinic) Topical Lidocaine 5% applied to wound bed - in clinic, prior to debridement Bathing/ Shower/ Hygiene May shower and wash wound with soap and water. Off-Loading Turn and reposition every 2 hours Travis wound care orders this week; continue Home Health for wound care. May utilize formulary equivalent dressing for wound treatment orders unless otherwise specified. Other Home Health Orders/Instructions: - [facility] Wound Treatment Wound #2 - Ischial Tuberosity Wound Laterality: Left Cleanser: Soap and Water 1 x Per Day/30 Days Discharge Instructions: May shower and wash wound with dial antibacterial soap and water prior to dressing change. Peri-Wound Care: Skin Prep 1 x Per Day/30 Days Discharge Instructions: Use skin prep as directed Peri-Wound Care: Zinc Oxide Ointment 30g tube 1 x Per Day/30 Days Discharge Instructions: Apply Zinc Oxide to periwound with each dressing change as needed Prim Dressing: Sorbalgon AG Dressing 2x2 (in/in) (DME) (Generic) 1 x Per Day/30 Days ary Discharge Instructions: Apply to wound bed , be sure to tuck into wound Secondary Dressing: Zetuvit Plus Silicone Border Dressing 5x5 (in/in) 1 x Per Day/30 Days Discharge Instructions: Apply silicone border over primary dressing as directed. Secured With: 70M Medipore Public affairs consultant Surgical T 2x10 (in/yd) 1 x Per Day/30 Days ape Discharge Instructions: Secure with tape as directed. Electronic Signature(s) Signed: 03/13/2022 5:28:47 PM By: Baruch Gouty RN, BSN Signed: 03/14/2022 7:36:34 AM By: Fredirick Maudlin MD FACS Entered By: Baruch Gouty on 03/13/2022 16:22:19 -------------------------------------------------------------------------------- Problem  List Details Patient Name: Date of Service: [name], [name] 03/13/2022 2:45 PM Medical Record Number: [id number] Patient Account Number: [id number] Date of Birth/Sex: Treating RN: December 31, 1960 (62 y.o. Ernestene Mention Primary Care Provider: Caren Macadam Other Clinician: Referring Provider: Treating Provider/Extender: Odus, Clasby ([id number]) [id number] Page 4 of 8 Weeks in Treatment: 6 Active Problems ICD-10 Encounter Code Description Active Date MDM Diagnosis 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, stage 4 01/30/2022 No Yes G80.9 Cerebral palsy, unspecified 01/30/2022 No Yes M86.9 Osteomyelitis, unspecified 01/30/2022 No Yes Inactive  Problems Resolved Problems ICD-10 Code Description Active Date Resolved Date L89.154 Pressure ulcer of sacral region, stage 4 01/30/2022 01/30/2022 Electronic Signature(s) Signed: 03/13/2022 3:55:21 PM By: Duanne Guessannon, Jameel Quant MD FACS Entered By: Duanne Guessannon, Jannah Guardiola on 03/13/2022 15:55:20 -------------------------------------------------------------------------------- Progress Note Details Patient Name: Date of Service: Brett NearingBA TES, [name] 03/13/2022 2:45 PM Medical Record Number: [id number] Patient Account Number: [id number] Date of Birth/Sex: Treating RN: 09/10/1960 (62 y.o. M) Primary Care Provider: Aliene BeamsHagler, Rachel Other Clinician: Referring Provider: Treating Provider/Extender: Karna Dupesannon, Jeanne Diefendorf Hagler, Rachel Weeks in Treatment: 6 Subjective Chief Complaint Information obtained from Patient Patient is at the clinic for treatment of open pressure ulcers History of Present Illness (HPI) ADMISSION 01/30/2022 This is a 62 year old man with cerebral palsy. He is nonverbal at baseline. He is accompanied by his sister, who is his primary caregiver, and his aunt. He was admitted to the hospital on November 11 with urinary retention and was identified as having a left gluteal pressure ulcer and a  sacral pressure ulcer. His sister says that the wounds have been there for about a month. A CT scan of the abdomen and pelvis was performed that was suggestive of osteomyelitis. There was also concern for infection of his wound. He was placed on antibiotics. General surgery was consulted and did not think there was any role for surgical intervention. They have been managing the wounds at home with topical Medihoney and then packing the cavities with saline moistened gauze. A Roho cushion has already been ordered for his wheelchair and they are in the process of obtaining a low-air-loss mattress bed for him. The ulcer over his sacrum is fairly small with just a little bit of depth to it. There is good granulation tissue present with just a bit of slough in the base. The ischial ulcer is more extensive and there is tendon exposed at the base. There is also evidence of ongoing pressure-induced tissue injury. 02/13/2022: Both wounds are smaller today. There is still a fair amount of depth to the ischial ulcer. The sacral is fairly shallow. There is hypertrophic granulation tissue at both sites. I do not see any further evidence of pressure induced tissue injury. 02/27/2022: The sacral ulcer is almost completely closed. There is a pinhole opening remaining. The ischial ulcer has filled in considerably and I do not appreciate exposed tendon any longer. The hypertrophic granulation tissue has not reaccumulated. No concern for infection. Geanie KenningBATES, Saajan L ([id number]) [id number] Page 5 of 8 03/13/2022: The sacral ulcer is healed. The ischial ulcer is smaller. There is hypertrophic granulation tissue and there is still some depth to the wound at the most cranial aspect. Patient History Information obtained from Patient. Family History Cancer - Father,Maternal Grandparents, Diabetes - Mother, Heart Disease - Mother, Hypertension - Siblings,Father,Mother, No family history of Hereditary  Spherocytosis, Kidney Disease, Lung Disease, Seizures, Stroke, Thyroid Problems, Tuberculosis. Social History Never smoker, Marital Status - Single, Alcohol Use - Never, Drug Use - No History, Caffeine Use - Daily. Medical History Eyes Denies history of Cataracts, Glaucoma, Optic Neuritis Ear/Nose/Mouth/Throat Denies history of Chronic sinus problems/congestion, Middle ear problems Hematologic/Lymphatic Denies history of Anemia, Hemophilia, Human Immunodeficiency Virus, Lymphedema, Sickle Cell Disease Respiratory Denies history of Aspiration, Asthma, Chronic Obstructive Pulmonary Disease (COPD), Pneumothorax, Sleep Apnea, Tuberculosis Cardiovascular Denies history of Angina, Arrhythmia, Congestive Heart Failure, Coronary Artery Disease, Deep Vein Thrombosis, Hypertension, Hypotension, Myocardial Infarction, Peripheral Arterial Disease, Peripheral Venous Disease, Phlebitis, Vasculitis Endocrine Denies history of Type I Diabetes, Type II Diabetes Integumentary (Skin) Denies history of History  of Burn Oncologic Denies history of Received Chemotherapy, Received Radiation Psychiatric Denies history of Anorexia/bulimia Medical A Surgical History Notes nd Genitourinary nephrolithiasis nonfunctioning kidney Neurologic cerebral palsy Objective Constitutional no acute distress. Vitals Time Taken: 3:23 PM, Height: 68 in, Weight: 115 lbs, BMI: 17.5, Temperature: 97.5 F, Pulse: 86 bpm, Respiratory Rate: 18 breaths/min, Blood Pressure: 112/74 mmHg. Respiratory Normal work of breathing on room air. General Notes: 03/13/2022: The sacral ulcer is healed. The ischial ulcer is smaller. There is hypertrophic granulation tissue and there is still some depth to the wound at the most cranial aspect. Integumentary (Hair, Skin) Wound #1 status is Healed - Epithelialized. Original cause of wound was Pressure Injury. The date acquired was: 11/30/2021. The wound has been in treatment 6 weeks. The wound  is located on the Coccyx. The wound measures 0cm length x 0cm width x 0cm depth; 0cm^2 area and 0cm^3 volume. There is no tunneling or undermining noted. There is a none present amount of drainage noted. There is no granulation within the wound bed. There is no necrotic tissue within the wound bed. The periwound skin appearance had no abnormalities noted for moisture. The periwound skin appearance had no abnormalities noted for color. The periwound skin appearance exhibited: Rash. Periwound temperature was noted as No Abnormality. Wound #2 status is Open. Original cause of wound was Pressure Injury. The date acquired was: 11/30/2021. The wound has been in treatment 6 weeks. The wound is located on the Left Ischial Tuberosity. The wound measures 2.7cm length x 0.7cm width x 0.7cm depth; 1.484cm^2 area and 1.039cm^3 volume. There is Fat Layer (Subcutaneous Tissue) exposed. There is no tunneling or undermining noted. There is a medium amount of serosanguineous drainage noted. The wound margin is distinct with the outline attached to the wound base. There is large (67-100%) red, hyper - granulation within the wound bed. There is no necrotic tissue within the wound bed. The periwound skin appearance exhibited: Rash, Erythema. The periwound skin appearance did not exhibit: Excoriation, Dry/Scaly, Maceration. The surrounding wound skin color is noted with erythema. Periwound temperature was noted as No Abnormality. Assessment Active Problems ICD-10 Pressure ulcer of left buttock, stage 4 Voisin, Arinze L ([id number]) [id number] Page 6 of 8 Cerebral palsy, unspecified Osteomyelitis, unspecified Procedures Wound #2 Pre-procedure diagnosis of Wound #2 is an Acute Osteomyelitis located on the Left Ischial Tuberosity . An Chemical Cauterization procedure was performed by Duanne Guess, MD. Post procedure Diagnosis Wound #2: Same as Pre-Procedure Notes: using silver nitrate  stick Plan Follow-up Appointments: Return Appointment in 2 weeks. - Dr. Lady Gary Room 3 ***HOYER*** Thursday 2/1 @ 3:00 pm Anesthetic: Wound #2 Left Ischial Tuberosity: (In clinic) Topical Lidocaine 5% applied to wound bed - in clinic, prior to debridement Bathing/ Shower/ Hygiene: May shower and wash wound with soap and water. Off-Loading: Turn and reposition every 2 hours Home Health: New wound care orders this week; continue Home Health for wound care. May utilize formulary equivalent dressing for wound treatment orders unless otherwise specified. Other Home Health Orders/Instructions: - [facility] WOUND #2: - Ischial Tuberosity Wound Laterality: Left Cleanser: Soap and Water 1 x Per Day/30 Days Discharge Instructions: May shower and wash wound with dial antibacterial soap and water prior to dressing change. Peri-Wound Care: Skin Prep 1 x Per Day/30 Days Discharge Instructions: Use skin prep as directed Peri-Wound Care: Zinc Oxide Ointment 30g tube 1 x Per Day/30 Days Discharge Instructions: Apply Zinc Oxide to periwound with each dressing change as needed Prim Dressing: Sorbalgon AG Dressing 2x2 (  in/in) (DME) (Generic) 1 x Per Day/30 Days ary Discharge Instructions: Apply to wound bed , be sure to tuck into wound Secondary Dressing: Zetuvit Plus Silicone Border Dressing 5x5 (in/in) 1 x Per Day/30 Days Discharge Instructions: Apply silicone border over primary dressing as directed. Secured With: 46M Medipore Scientist, research (life sciences) Surgical T 2x10 (in/yd) 1 x Per Day/30 Days ape Discharge Instructions: Secure with tape as directed. 03/13/2022: The sacral ulcer is healed. The ischial ulcer is smaller. There is hypertrophic granulation tissue and there is still some depth to the wound at the most cranial aspect. No debridement was necessary. I chemically cauterized the hypertrophic granulation tissue with silver nitrate. I am going to change his dressing to silver alginate and his sister will pack this  into the cavity at the cranial aspect of the ischial ulcer as well as place it over the surface. She is doing an exceptional job of caring for these wounds. Follow-up in 2 weeks. Electronic Signature(s) Signed: 03/13/2022 5:28:47 PM By: Zenaida Deed RN, BSN Signed: 03/14/2022 7:36:34 AM By: Duanne Guess MD FACS Previous Signature: 03/13/2022 3:58:41 PM Version By: Duanne Guess MD FACS Entered By: Zenaida Deed on 03/13/2022 16:59:27 -------------------------------------------------------------------------------- HxROS Details Patient Name: Date of Service: [name], [name] 03/13/2022 2:45 PM Medical Record Number: [id number] Patient Account Number: [id number] Date of Birth/Sex: Treating RN: May 23, 1960 (62 y.o. M) Primary Care Provider: Aliene Beams Other Clinician: Referring Provider: Treating Provider/Extender: Karna Dupes in Treatment: 6 Bouse, [name] ([id number]) [id number] Page 7 of 8 Information Obtained From Patient Eyes Medical History: Negative for: Cataracts; Glaucoma; Optic Neuritis Ear/Nose/Mouth/Throat Medical History: Negative for: Chronic sinus problems/congestion; Middle ear problems Hematologic/Lymphatic Medical History: Negative for: Anemia; Hemophilia; Human Immunodeficiency Virus; Lymphedema; Sickle Cell Disease Respiratory Medical History: Negative for: Aspiration; Asthma; Chronic Obstructive Pulmonary Disease (COPD); Pneumothorax; Sleep Apnea; Tuberculosis Cardiovascular Medical History: Negative for: Angina; Arrhythmia; Congestive Heart Failure; Coronary Artery Disease; Deep Vein Thrombosis; Hypertension; Hypotension; Myocardial Infarction; Peripheral Arterial Disease; Peripheral Venous Disease; Phlebitis; Vasculitis Endocrine Medical History: Negative for: Type I Diabetes; Type II Diabetes Genitourinary Medical History: Past Medical History Notes: nephrolithiasis nonfunctioning  kidney Integumentary (Skin) Medical History: Negative for: History of Burn Neurologic Medical History: Past Medical History Notes: cerebral palsy Oncologic Medical History: Negative for: Received Chemotherapy; Received Radiation Psychiatric Medical History: Negative for: Anorexia/bulimia Immunizations Pneumococcal Vaccine: Received Pneumococcal Vaccination: Yes Received Pneumococcal Vaccination On or After 60th Birthday: No Implantable Devices None Family and Social History Cancer: Yes - Father,Maternal Grandparents; Diabetes: Yes - Mother; Heart Disease: Yes - Mother; Hereditary Spherocytosis: No; Hypertension: Yes - Siblings,Father,Mother; Kidney Disease: No; Lung Disease: No; Seizures: No; Stroke: No; Thyroid Problems: No; Tuberculosis: No; Never smoker; Marital Status - Single; Alcohol Use: Never; Drug Use: No History; Caffeine Use: Daily; Financial Concerns: No; Food, Clothing or Shelter Needs: No; Support System Lacking: No; Transportation Concerns: No JODY, SILAS ([id number]) [id number] Page 8 of 8 Electronic Signature(s) Signed: 03/14/2022 7:36:34 AM By: Duanne Guess MD FACS Entered By: Duanne Guess on 03/13/2022 15:56:11 -------------------------------------------------------------------------------- SuperBill Details Patient Name: Date of Service: [name], [name] 03/13/2022 Medical Record Number: [id number] Patient Account Number: [id number] Date of Birth/Sex: Treating RN: 15-Jan-1961 (62 y.o. Damaris Schooner Primary Care Provider: Aliene Beams Other Clinician: Referring Provider: Treating Provider/Extender: Karna Dupes in Treatment: 6 Diagnosis Coding ICD-10 Codes Code Description [phone] Pressure ulcer of left buttock, stage 4 G80.9 Cerebral palsy, unspecified M86.9 Osteomyelitis, unspecified Facility Procedures : CPT4 Code: [id number] Description: 17250 -  CHEM CAUT GRANULATION TISS ICD-10  Diagnosis Description 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, stage 4 Modifier: Quantity: 1 Physician Procedures : CPT4 Code Description Modifier [id number] - WC PHYS LEVEL 4 - EST PT ICD-10 Diagnosis Description 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, stage 4 G80.9 Cerebral palsy, unspecified M86.9 Osteomyelitis, unspecified Quantity: 1 : [id number] - WC PHYS CHEM CAUT GRAN TISSUE ICD-10 Diagnosis Description 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, stage 4 Quantity: 1 Electronic Signature(s) Signed: 03/13/2022 3:59:34 PM By: Fredirick Maudlin MD FACS Entered By: Fredirick Maudlin on 03/13/2022 15:59:34

## 2022-03-17 ENCOUNTER — Ambulatory Visit (HOSPITAL_COMMUNITY)
Admission: RE | Admit: 2022-03-17 | Discharge: 2022-03-17 | Disposition: A | Discharge status: Home / Self Care | Payer: Medicare Other | Source: Ambulatory Visit | Attending: Urology | Admitting: Urology

## 2022-03-19 ENCOUNTER — Ambulatory Visit (INDEPENDENT_AMBULATORY_CARE_PROVIDER_SITE_OTHER): Payer: Medicare Other | Admitting: Urology

## 2022-03-19 ENCOUNTER — Ambulatory Visit: Payer: Medicare Other | Admitting: Urology

## 2022-03-19 DIAGNOSIS — L89154 Pressure ulcer of sacral region, stage 4: Secondary | ICD-10-CM | POA: Diagnosis not present

## 2022-03-19 DIAGNOSIS — M869 Osteomyelitis, unspecified: Secondary | ICD-10-CM | POA: Diagnosis not present

## 2022-03-19 DIAGNOSIS — G809 Cerebral palsy, unspecified: Secondary | ICD-10-CM | POA: Diagnosis not present

## 2022-03-19 DIAGNOSIS — L89324 Pressure ulcer of left buttock, stage 4: Secondary | ICD-10-CM | POA: Diagnosis not present

## 2022-03-19 DIAGNOSIS — R339 Retention of urine, unspecified: Secondary | ICD-10-CM

## 2022-03-19 NOTE — Progress Notes (Signed)
Cath Change/ Replacement  Patient is present today for a catheter change due to urinary retention.  53ml of water was removed from the balloon, a 16FR foley cath was removed without difficulty.  Patient was cleaned and prepped in a sterile fashion with betadine and 2% lidocaine jelly was instilled into the urethra. A 16 FR foley cath was replaced into the bladder, no complications were noted. Urine return was noted 60ml and urine was yellow in color. The balloon was filled with 23ml of sterile water. A bed bag was attached for drainage.  A night bag was also given to the patient and patient was given instruction on how to change from one bag to another. Patient was given proper instruction on catheter care.    Performed by: Levi Aland, CMA  Follow up: Follow up as scheduled.

## 2022-03-20 DIAGNOSIS — R339 Retention of urine, unspecified: Secondary | ICD-10-CM | POA: Diagnosis not present

## 2022-03-20 DIAGNOSIS — L89324 Pressure ulcer of left buttock, stage 4: Secondary | ICD-10-CM | POA: Diagnosis not present

## 2022-03-20 DIAGNOSIS — G808 Other cerebral palsy: Secondary | ICD-10-CM | POA: Diagnosis not present

## 2022-03-20 DIAGNOSIS — Z466 Encounter for fitting and adjustment of urinary device: Secondary | ICD-10-CM | POA: Diagnosis not present

## 2022-03-20 DIAGNOSIS — Z87440 Personal history of urinary (tract) infections: Secondary | ICD-10-CM | POA: Diagnosis not present

## 2022-03-25 DIAGNOSIS — Z466 Encounter for fitting and adjustment of urinary device: Secondary | ICD-10-CM | POA: Diagnosis not present

## 2022-03-25 DIAGNOSIS — Z87440 Personal history of urinary (tract) infections: Secondary | ICD-10-CM | POA: Diagnosis not present

## 2022-03-25 DIAGNOSIS — L89324 Pressure ulcer of left buttock, stage 4: Secondary | ICD-10-CM | POA: Diagnosis not present

## 2022-03-25 DIAGNOSIS — G808 Other cerebral palsy: Secondary | ICD-10-CM | POA: Diagnosis not present

## 2022-03-25 DIAGNOSIS — R339 Retention of urine, unspecified: Secondary | ICD-10-CM | POA: Diagnosis not present

## 2022-03-27 ENCOUNTER — Encounter (HOSPITAL_BASED_OUTPATIENT_CLINIC_OR_DEPARTMENT_OTHER): Payer: Medicare Other | Admitting: General Surgery

## 2022-03-31 DIAGNOSIS — Z87440 Personal history of urinary (tract) infections: Secondary | ICD-10-CM | POA: Diagnosis not present

## 2022-03-31 DIAGNOSIS — L89324 Pressure ulcer of left buttock, stage 4: Secondary | ICD-10-CM | POA: Diagnosis not present

## 2022-03-31 DIAGNOSIS — R339 Retention of urine, unspecified: Secondary | ICD-10-CM | POA: Diagnosis not present

## 2022-03-31 DIAGNOSIS — G808 Other cerebral palsy: Secondary | ICD-10-CM | POA: Diagnosis not present

## 2022-03-31 DIAGNOSIS — Z466 Encounter for fitting and adjustment of urinary device: Secondary | ICD-10-CM | POA: Diagnosis not present

## 2022-04-02 ENCOUNTER — Other Ambulatory Visit (HOSPITAL_COMMUNITY): Payer: Self-pay | Admitting: Physician Assistant

## 2022-04-02 DIAGNOSIS — N1339 Other hydronephrosis: Secondary | ICD-10-CM

## 2022-04-03 ENCOUNTER — Ambulatory Visit (HOSPITAL_COMMUNITY)
Admission: RE | Admit: 2022-04-03 | Discharge: 2022-04-03 | Disposition: A | Discharge status: Home / Self Care | Payer: Medicare Other | Source: Ambulatory Visit | Attending: Urology | Admitting: Urology

## 2022-04-03 ENCOUNTER — Other Ambulatory Visit: Payer: Self-pay

## 2022-04-03 DIAGNOSIS — G809 Cerebral palsy, unspecified: Secondary | ICD-10-CM | POA: Insufficient documentation

## 2022-04-03 DIAGNOSIS — R339 Retention of urine, unspecified: Secondary | ICD-10-CM | POA: Insufficient documentation

## 2022-04-03 DIAGNOSIS — N1339 Other hydronephrosis: Secondary | ICD-10-CM | POA: Insufficient documentation

## 2022-04-03 DIAGNOSIS — G8191 Hemiplegia, unspecified affecting right dominant side: Secondary | ICD-10-CM | POA: Diagnosis not present

## 2022-04-03 LAB — CBC
HCT: 40.6 % (ref 39.0–52.0)
Hemoglobin: 13.3 g/dL (ref 13.0–17.0)
MCH: 28.4 pg (ref 26.0–34.0)
MCHC: 32.8 g/dL (ref 30.0–36.0)
MCV: 86.8 fL (ref 80.0–100.0)
Platelets: 374 10*3/uL (ref 150–400)
RBC: 4.68 MIL/uL (ref 4.22–5.81)
RDW: 15.5 % (ref 11.5–15.5)
WBC: 7.9 10*3/uL (ref 4.0–10.5)
nRBC: 0 % (ref 0.0–0.2)

## 2022-04-03 LAB — PROTIME-INR
INR: 1.1 (ref 0.8–1.2)
Prothrombin Time: 13.7 seconds (ref 11.4–15.2)

## 2022-04-03 MED ORDER — MIDAZOLAM HCL 2 MG/2ML IJ SOLN
INTRAMUSCULAR | Status: AC | PRN
  Administered 2022-04-03: 1 mg via INTRAVENOUS

## 2022-04-03 MED ORDER — SODIUM CHLORIDE 0.9 % IV SOLN: INTRAVENOUS | Status: DC

## 2022-04-03 MED ORDER — SODIUM CHLORIDE 0.9 % IV SOLN
INTRAVENOUS | Status: AC
  Filled 2022-04-03: qty 20

## 2022-04-03 MED ORDER — FENTANYL CITRATE (PF) 100 MCG/2ML IJ SOLN
INTRAMUSCULAR | Status: AC
  Filled 2022-04-03: qty 2

## 2022-04-03 MED ORDER — FENTANYL CITRATE (PF) 100 MCG/2ML IJ SOLN
INTRAMUSCULAR | Status: AC | PRN
  Administered 2022-04-03: 50 ug via INTRAVENOUS

## 2022-04-03 MED ORDER — SODIUM CHLORIDE 0.9 % IV SOLN
INTRAVENOUS | Status: AC | PRN
  Administered 2022-04-03: 2 g via INTRAVENOUS

## 2022-04-03 MED ORDER — SODIUM CHLORIDE 0.9 % IV SOLN
2.0000 g | INTRAVENOUS | Status: DC
  Filled 2022-04-03: qty 20

## 2022-04-03 MED ORDER — MIDAZOLAM HCL 2 MG/2ML IJ SOLN
INTRAMUSCULAR | Status: AC
  Filled 2022-04-03: qty 2

## 2022-04-03 NOTE — H&P (Signed)
Chief Complaint: Patient was seen in consultation today for image-guided suprapubic catheter  Referring Physician(s): Fayette L  Supervising Physician: Markus Daft  Patient Status: [facility] - Out-pt  History of Present Illness: [name] is a 62 y.o. male with PMH significant for cerebral palsy, right-sided paralysis, and post-operative nausea and vomiting being seen today in relation to chronic urinary retention. The patient is followed by Dr Alyson Ingles from Urology and has an indwelling foley catheter. The patient is accompanied today by his sister/caregiver. She reports that he is in his usual state of health with no concerns.  Past Medical History:  Diagnosis Date   Cerebral palsy ([place])    Complication of anesthesia    Nausated for 1 1/2 days after the procedure   Paralysis ([place])    paralysis on right side-limited movement on left side   PONV (postoperative nausea and vomiting)     Past Surgical History:  Procedure Laterality Date   COLONOSCOPY WITH PROPOFOL N/A 08/26/2020   Procedure: COLONOSCOPY WITH PROPOFOL;  Surgeon: Clarene Essex, MD;  Location: [facility];  Service: Endoscopy;  Laterality: N/A;   ESOPHAGOGASTRODUODENOSCOPY (EGD) WITH PROPOFOL N/A 08/26/2020   Procedure: ESOPHAGOGASTRODUODENOSCOPY (EGD) WITH PROPOFOL;  Surgeon: Clarene Essex, MD;  Location: [facility];  Service: Endoscopy;  Laterality: N/A;   IR NEPHROSTOMY PLACEMENT RIGHT  01/03/2019   ROBOT ASSISTED LAPAROSCOPIC NEPHRECTOMY Right 02/24/2019   Procedure: XI ROBOTIC ASSISTED LAPAROSCOPIC NEPHRECTOMY;  Surgeon: Cleon Gustin, MD;  Location: [facility];  Service: Urology;  Laterality: Right;  3 HRS   TENDON RELEASE      Allergies: Patient has no known allergies.  Medications: Prior to Admission medications   Medication Sig Start Date End Date Taking? Authorizing Provider  bisacodyl (DULCOLAX) 10 MG suppository Place 1 suppository (10 mg total) rectally daily as needed for moderate  constipation. 01/13/22  Yes Lavina Hamman, MD  docusate sodium (COLACE) 100 MG capsule Take 1 capsule (100 mg total) by mouth 2 (two) times daily. 01/10/22 01/10/23 Yes Lavina Hamman, MD  Ensure Max Protein (ENSURE MAX PROTEIN) LIQD Take 330 mLs (11 oz total) by mouth daily. 01/11/22  Yes Lavina Hamman, MD  leptospermum manuka honey (MEDIHONEY) PSTE paste Apply 1 Application topically daily. 01/11/22  Yes Lavina Hamman, MD  magnesium hydroxide (MILK OF MAGNESIA) 400 MG/5ML suspension Take 15 mLs by mouth daily as needed for mild constipation. 01/13/22  Yes Lavina Hamman, MD  Multiple Vitamin (MULTIVITAMIN WITH MINERALS) TABS tablet Take 1 tablet by mouth daily. 01/11/22  Yes Lavina Hamman, MD  nutrition supplement, JUVEN, (JUVEN) PACK Take 1 packet by mouth 2 (two) times daily between meals. 01/10/22  Yes Lavina Hamman, MD  polyethylene glycol (MIRALAX / GLYCOLAX) 17 g packet Take 17 g by mouth daily. 01/10/22  Yes Lavina Hamman, MD  acetaminophen (TYLENOL) 325 MG tablet Take 325 mg by mouth every 6 (six) hours as needed for moderate pain or headache.    [provider]  silodosin (RAPAFLO) 8 MG CAPS capsule Take 1 capsule (8 mg total) by mouth daily with breakfast. Patient not taking: Reported on 03/19/2022 10/23/21   [place], Berneice Heinrich, PA-C  simethicone (MYLICON) 80 MG chewable tablet Chew 1 tablet (80 mg total) by mouth 4 (four) times daily. 01/10/22   Lavina Hamman, MD     Family History  Problem Relation Age of Onset   Diabetes Mother    Heart failure Mother    Hypertension Mother  Diabetes Father    Heart failure Father    Hypertension Father    Cancer Father     Social History   Socioeconomic History   Marital status: Single    Spouse name: Not on file   Number of children: Not on file   Years of education: Not on file   Highest education level: Not on file  Occupational History   Not on file  Tobacco Use   Smoking status: Never    Smokeless tobacco: Never  Vaping Use   Vaping Use: Never used  Substance and Sexual Activity   Alcohol use: No   Drug use: No   Sexual activity: Not on file  Other Topics Concern   Not on file  Social History Narrative   Not on file   Social Determinants of Health   Financial Resource Strain: Not on file  Food Insecurity: No Food Insecurity (01/05/2022)   Hunger Vital Sign    Worried About Running Out of Food in the Last Year: Never true    Ran Out of Food in the Last Year: Never true  Transportation Needs: No Transportation Needs (01/05/2022)   PRAPARE - Hydrologist (Medical): No    Lack of Transportation (Non-Medical): No  Physical Activity: Not on file  Stress: Not on file  Social Connections: Not on file    Code Status: Full code per discussion with sister, as DNR paperwork has not yet been filled out today.   Review of Systems: Conducted via conversation with patient's sister/caregiver  Review of Systems  Constitutional:  Negative for chills and fever.  Respiratory:  Negative for chest tightness and shortness of breath.   Cardiovascular:  Negative for chest pain and leg swelling.  Gastrointestinal:  Negative for diarrhea, nausea and vomiting.  Neurological:  Negative for dizziness and headaches.  Psychiatric/Behavioral:  Negative for behavioral problems.     Vital Signs: BP 120/77   Pulse 92   Temp (!) 97.2 F (36.2 C) (Temporal)   Resp 16   Ht 5\' 6"  (1.676 m)   Wt 120 lb (54.4 kg)   SpO2 94%   BMI 19.37 kg/m     Physical Exam Vitals reviewed.  Constitutional:      General: He is not in acute distress.    Appearance: He is not ill-appearing.  HENT:     Mouth/Throat:     Mouth: Mucous membranes are moist.  Cardiovascular:     Rate and Rhythm: Normal rate and regular rhythm.     Pulses: Normal pulses.     Heart sounds: Normal heart sounds.  Pulmonary:     Effort: Pulmonary effort is normal.     Breath sounds: Normal  breath sounds.  Abdominal:     Palpations: Abdomen is soft.     Tenderness: There is no abdominal tenderness.  Musculoskeletal:     Right lower leg: No edema.     Left lower leg: No edema.  Skin:    General: Skin is warm and dry.  Neurological:     Mental Status: He is alert. Mental status is at baseline.     Imaging: No results found.  Labs:  CBC: Recent Labs    01/07/22 0422 01/08/22 0403 01/09/22 0419 04/03/22 0952  WBC 8.0 7.6 8.2 7.9  HGB 9.7* 10.3* 11.0* 13.3  HCT 32.3* 35.5* 37.6* 40.6  PLT 412* 441* 426* 374    COAGS: No results for input(s): "INR", "APTT" in the last 8760  hours.  BMP: Recent Labs    01/07/22 0422 01/08/22 0403 01/09/22 0419 01/12/22 1027  NA 134* 138 137 137  K 3.7 4.1 4.0 4.3  CL 103 106 106 102  CO2 23 24 23 28   GLUCOSE 131* 104* 103* 99  BUN 19 26* 43* 46*  CALCIUM 8.3* 8.4* 8.8* 8.8*  CREATININE 0.87 0.86 1.01 0.89  GFRNONAA >60 >60 >60 >60    LIVER FUNCTION TESTS: Recent Labs    01/05/22 1157  ALBUMIN 2.5*    TUMOR MARKERS: No results for input(s): "AFPTM", "CEA", "CA199", "CHROMGRNA" in the last 8760 hours.  Assessment and Plan:   [name] is a 62 yo male being seen today for image-guided suprapubic catheter secondary to chronic urinary retention. The patient was referred to IR from Urology by Dr Alyson Ingles. The case has been reviewed with Dr Anselm Pancoast and is set to proceed on 04/03/22  Risks and benefits of image-guided suprapubic catheter placement discussed with the patient's sister including bleeding, infection, damage to adjacent structures, bowel perforation/fistula connection, and sepsis.  All of the patient's questions were answered, patient is agreeable to proceed. Consent signed and in chart.   Thank you for this interesting consult.  I greatly enjoyed meeting [name] and look forward to participating in their care.  A copy of this report was sent to the requesting provider on this  date.  Electronically Signed: Lura Em, PA-C 04/03/2022, 10:18 AM   I spent a total of  40 Minutes   in face to face in clinical consultation, greater than 50% of which was counseling/coordinating care for image-guided suprapubic catheter placement.

## 2022-04-03 NOTE — Procedures (Signed)
Interventional Radiology Procedure:   Indications: Urinary retention and scheduled for suprapubic catheter placement   Procedure: CT pelvis, manual bowel disimpaction  Findings: Massive amount of stool in rectum that displaced the urinary bladder.  Difficult to distend bladder due to leakage around Foley and probably related to displacement of the bladder.  Manual disimpaction was performed and removed a large of stool from rectum but there is still a large amount of residual stool.  Suprapubic catheter not placed.   Complications: No immediate complications noted.     EBL: Minimal  Plan: Refer to GI for addition bowel decompression.  Can schedule for suprapubic catheter when and if rectum is decompressed.    Sacha Radloff R. Anselm Pancoast, MD  Pager: [phone]

## 2022-04-07 ENCOUNTER — Encounter (HOSPITAL_BASED_OUTPATIENT_CLINIC_OR_DEPARTMENT_OTHER): Payer: Medicare Other | Admitting: General Surgery

## 2022-04-09 DIAGNOSIS — G809 Cerebral palsy, unspecified: Secondary | ICD-10-CM | POA: Diagnosis not present

## 2022-04-09 DIAGNOSIS — L89212 Pressure ulcer of right hip, stage 2: Secondary | ICD-10-CM | POA: Diagnosis not present

## 2022-04-09 DIAGNOSIS — Z87440 Personal history of urinary (tract) infections: Secondary | ICD-10-CM | POA: Diagnosis not present

## 2022-04-09 DIAGNOSIS — R339 Retention of urine, unspecified: Secondary | ICD-10-CM | POA: Diagnosis not present

## 2022-04-09 DIAGNOSIS — Z466 Encounter for fitting and adjustment of urinary device: Secondary | ICD-10-CM | POA: Diagnosis not present

## 2022-04-09 DIAGNOSIS — G808 Other cerebral palsy: Secondary | ICD-10-CM | POA: Diagnosis not present

## 2022-04-09 DIAGNOSIS — L89324 Pressure ulcer of left buttock, stage 4: Secondary | ICD-10-CM | POA: Diagnosis not present

## 2022-04-16 DIAGNOSIS — Z87440 Personal history of urinary (tract) infections: Secondary | ICD-10-CM | POA: Diagnosis not present

## 2022-04-16 DIAGNOSIS — G808 Other cerebral palsy: Secondary | ICD-10-CM | POA: Diagnosis not present

## 2022-04-16 DIAGNOSIS — Z466 Encounter for fitting and adjustment of urinary device: Secondary | ICD-10-CM | POA: Diagnosis not present

## 2022-04-16 DIAGNOSIS — L89324 Pressure ulcer of left buttock, stage 4: Secondary | ICD-10-CM | POA: Diagnosis not present

## 2022-04-16 DIAGNOSIS — R339 Retention of urine, unspecified: Secondary | ICD-10-CM | POA: Diagnosis not present

## 2022-04-18 ENCOUNTER — Encounter (HOSPITAL_BASED_OUTPATIENT_CLINIC_OR_DEPARTMENT_OTHER): Payer: Medicare Other | Attending: General Surgery | Admitting: General Surgery

## 2022-04-18 DIAGNOSIS — Z872 Personal history of diseases of the skin and subcutaneous tissue: Secondary | ICD-10-CM | POA: Insufficient documentation

## 2022-04-18 DIAGNOSIS — G809 Cerebral palsy, unspecified: Secondary | ICD-10-CM | POA: Insufficient documentation

## 2022-04-18 DIAGNOSIS — Z09 Encounter for follow-up examination after completed treatment for conditions other than malignant neoplasm: Secondary | ICD-10-CM | POA: Insufficient documentation

## 2022-04-18 DIAGNOSIS — L89320 Pressure ulcer of left buttock, unstageable: Secondary | ICD-10-CM | POA: Diagnosis not present

## 2022-04-19 NOTE — Progress Notes (Signed)
[place], DUEL ([id number]) [phone].pdf Page 1 of 6 Visit Report for 04/18/2022 Chief Complaint Document Details Patient Name: Date of Service: [name] 04/18/2022 12:45 PM Medical Record Number: [id number] Patient Account Number: [id number] Date of Birth/Sex: Treating RN: 08-Jun-1960 (62 y.o. M) Primary Care Provider: Caren Macadam Other Clinician: Referring Provider: Treating Provider/Extender: Ellis Parents in Treatment: 11 Information Obtained from: Patient Chief Complaint Patient is at the clinic for treatment of open pressure ulcers Electronic Signature(s) Signed: 04/18/2022 1:19:17 PM By: Fredirick Maudlin MD FACS Entered By: Fredirick Maudlin on 04/18/2022 13:19:17 -------------------------------------------------------------------------------- HPI Details Patient Name: Date of Service: [name], Brett BERT Wise. 04/18/2022 12:45 PM Medical Record Number: [id number] Patient Account Number: [id number] Date of Birth/Sex: Treating RN: 1961-02-19 (62 y.o. M) Primary Care Provider: Caren Macadam Other Clinician: Referring Provider: Treating Provider/Extender: Ellis Parents in Treatment: 11 History of Present Illness HPI Description: ADMISSION 01/30/2022 This is a 62 year old man with cerebral palsy. He is nonverbal at baseline. He is accompanied by his sister, who is his primary caregiver, and his aunt. He was admitted to the hospital on November 11 with urinary retention and was identified as having a left gluteal pressure ulcer and a sacral pressure ulcer. His sister says that the wounds have been there for about a month. A CT scan of the abdomen and pelvis was performed that was suggestive of osteomyelitis. There was also concern for infection of his wound. He was placed on antibiotics. General surgery was consulted and did not think there was any role for surgical intervention. They have been managing the  wounds at home with topical Medihoney and then packing the cavities with saline moistened gauze. A Roho cushion has already been ordered for his wheelchair and they are in the process of obtaining a low-air-loss mattress bed for him. The ulcer over his sacrum is fairly small with just a little bit of depth to it. There is good granulation tissue present with just a bit of slough in the base. The ischial ulcer is more extensive and there is tendon exposed at the base. There is also evidence of ongoing pressure-induced tissue injury. 02/13/2022: Both wounds are smaller today. There is still a fair amount of depth to the ischial ulcer. The sacral is fairly shallow. There is hypertrophic granulation tissue at both sites. I do not see any further evidence of pressure induced tissue injury. 02/27/2022: The sacral ulcer is almost completely closed. There is a pinhole opening remaining. The ischial ulcer has filled in considerably and I do not appreciate exposed tendon any longer. The hypertrophic granulation tissue has not reaccumulated. No concern for infection. 03/13/2022: The sacral ulcer is healed. The ischial ulcer is smaller. There is hypertrophic granulation tissue and there is still some depth to the wound at the most cranial aspect. 04/18/2022: All of his wounds are healed. Electronic Signature(s) Signed: 04/18/2022 1:19:47 PM By: Fredirick Maudlin MD FACS Entered By: Fredirick Maudlin on 04/18/2022 13:19:46 -------------------------------------------------------------------------------- Physical Exam Details Patient Name: Date of Service: [name], Brett BERT Wise. 04/18/2022 12:45 PM Medical Record Number: [id number] Patient Account Number: [id number] Date of Birth/Sex: Treating RN: [birth year] (62 y.o. M) Primary Care Provider: Caren Macadam Other Clinician: Referring Provider: Treating Provider/Extender: [name], [name] ([id number]) [id number]  Page 2 of 6 Weeks in Treatment: 11 Constitutional . . . . no acute distress. Respiratory Normal work of breathing on room air. Notes 04/18/2022: All of his wounds are healed. Electronic Signature(s)  Signed: 04/18/2022 1:20:46 PM By: Fredirick Maudlin MD FACS Entered By: Fredirick Maudlin on 04/18/2022 13:20:46 -------------------------------------------------------------------------------- Physician Orders Details Patient Name: Date of Service: [name], Brett BERT Wise. 04/18/2022 12:45 PM Medical Record Number: [id number] Patient Account Number: [id number] Date of Birth/Sex: Treating RN: 21-Nov-1960 (62 y.o. Collene Gobble Primary Care Provider: Caren Macadam Other Clinician: Referring Provider: Treating Provider/Extender: Ellis Parents in Treatment: 11 Verbal / Phone Orders: No Diagnosis Coding ICD-10 Coding Code Description [id number] Pressure ulcer of left buttock, stage 4 G80.9 Cerebral palsy, unspecified M86.9 Osteomyelitis, unspecified Discharge From [facility] Discharge from [place]! Your wound is healed! Electronic Signature(s) Signed: 04/18/2022 1:20:58 PM By: Fredirick Maudlin MD FACS Entered By: Fredirick Maudlin on 04/18/2022 13:20:57 -------------------------------------------------------------------------------- Problem List Details Patient Name: Date of Service: [name], Brett BERT Wise. 04/18/2022 12:45 PM Medical Record Number: [id number] Patient Account Number: [id number] Date of Birth/Sex: Treating RN: [birth year] (62 y.o. M) Primary Care Provider: Caren Macadam Other Clinician: Referring Provider: Treating Provider/Extender: Ellis Parents in Treatment: 11 Active Problems ICD-10 Encounter Code Description Active Date MDM Diagnosis 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, stage 4 01/30/2022 No Yes G80.9 Cerebral palsy, unspecified 01/30/2022 No Yes M86.9 Osteomyelitis, unspecified 01/30/2022 No Yes [name],  [name] ([id number]) [phone].pdf Page 3 of 6 Inactive Problems Resolved Problems ICD-10 Code Description Active Date Resolved Date L89.154 Pressure ulcer of sacral region, stage 4 01/30/2022 01/30/2022 Electronic Signature(s) Signed: 04/18/2022 1:18:59 PM By: Fredirick Maudlin MD FACS Entered By: Fredirick Maudlin on 04/18/2022 13:18:59 -------------------------------------------------------------------------------- Progress Note Details Patient Name: Date of Service: [name], Brett BERT Wise. 04/18/2022 12:45 PM Medical Record Number: [id number] Patient Account Number: [id number] Date of Birth/Sex: Treating RN: 07/01/1960 (62 y.o. M) Primary Care Provider: Caren Macadam Other Clinician: Referring Provider: Treating Provider/Extender: Ellis Parents in Treatment: 11 Subjective Chief Complaint Information obtained from Patient Patient is at the clinic for treatment of open pressure ulcers History of Present Illness (HPI) ADMISSION 01/30/2022 This is a 62 year old man with cerebral palsy. He is nonverbal at baseline. He is accompanied by his sister, who is his primary caregiver, and his aunt. He was admitted to the hospital on November 11 with urinary retention and was identified as having a left gluteal pressure ulcer and a sacral pressure ulcer. His sister says that the wounds have been there for about a month. A CT scan of the abdomen and pelvis was performed that was suggestive of osteomyelitis. There was also concern for infection of his wound. He was placed on antibiotics. General surgery was consulted and did not think there was any role for surgical intervention. They have been managing the wounds at home with topical Medihoney and then packing the cavities with saline moistened gauze. A Roho cushion has already been ordered for his wheelchair and they are in the process of obtaining a low-air-loss mattress bed for him. The ulcer over his  sacrum is fairly small with just a little bit of depth to it. There is good granulation tissue present with just a bit of slough in the base. The ischial ulcer is more extensive and there is tendon exposed at the base. There is also evidence of ongoing pressure-induced tissue injury. 02/13/2022: Both wounds are smaller today. There is still a fair amount of depth to the ischial ulcer. The sacral is fairly shallow. There is hypertrophic granulation tissue at both sites. I do not see any further evidence of pressure induced tissue injury. 02/27/2022: The  sacral ulcer is almost completely closed. There is a pinhole opening remaining. The ischial ulcer has filled in considerably and I do not appreciate exposed tendon any longer. The hypertrophic granulation tissue has not reaccumulated. No concern for infection. 03/13/2022: The sacral ulcer is healed. The ischial ulcer is smaller. There is hypertrophic granulation tissue and there is still some depth to the wound at the most cranial aspect. 04/18/2022: All of his wounds are healed. Patient History Information obtained from Patient. Family History Cancer - Father,Maternal Grandparents, Diabetes - Mother, Heart Disease - Mother, Hypertension - Siblings,Father,Mother, No family history of Hereditary Spherocytosis, Kidney Disease, Lung Disease, Seizures, Stroke, Thyroid Problems, Tuberculosis. Social History Never smoker, Marital Status - Single, Alcohol Use - Never, Drug Use - No History, Caffeine Use - Daily. Medical History Eyes Denies history of Cataracts, Glaucoma, Optic Neuritis Ear/Nose/Mouth/Throat Denies history of Chronic sinus problems/congestion, Middle ear problems Hematologic/Lymphatic Denies history of Anemia, Hemophilia, Human Immunodeficiency Virus, Lymphedema, Sickle Cell Disease Respiratory Denies history of Aspiration, Asthma, Chronic Obstructive Pulmonary Disease (COPD), Pneumothorax, Sleep Apnea, Tuberculosis Cardiovascular Denies  history of Angina, Arrhythmia, Congestive Heart Failure, Coronary Artery Disease, Deep Vein Thrombosis, Hypertension, Hypotension, Myocardial Infarction, Peripheral Arterial Disease, Peripheral Venous Disease, Phlebitis, Vasculitis Endocrine [name], [name] ([id number] Page 4 of 6 Denies history of Type I Diabetes, Type II Diabetes Integumentary (Skin) Denies history of History of Burn Oncologic Denies history of Received Chemotherapy, Received Radiation Psychiatric Denies history of Anorexia/bulimia Medical A Surgical History Notes nd Genitourinary nephrolithiasis nonfunctioning kidney Neurologic cerebral palsy Objective Constitutional no acute distress. Vitals Time Taken: 12:48 PM, Height: 68 in, Weight: 115 lbs, BMI: 17.5, Temperature: 97.7 F, Pulse: 96 bpm, Respiratory Rate: 18 breaths/min, Blood Pressure: 128/77 mmHg. Respiratory Normal work of breathing on room air. General Notes: 04/18/2022: All of his wounds are healed. Integumentary (Hair, Skin) Wound #2 status is Healed - Epithelialized. Original cause of wound was Pressure Injury. The date acquired was: 11/30/2021. The wound has been in treatment 11 weeks. The wound is located on the Left Ischial Tuberosity. The wound measures 0cm length x 0cm width x 0cm depth; 0cm^2 area and 0cm^3 volume. There is no tunneling or undermining noted. There is a none present amount of drainage noted. The wound margin is distinct with the outline attached to the wound base. There is no granulation within the wound bed. There is no necrotic tissue within the wound bed. The periwound skin appearance had no abnormalities noted for texture. The periwound skin appearance had no abnormalities noted for moisture. The periwound skin appearance had no abnormalities noted for color. Periwound temperature was noted as No Abnormality. Assessment Active Problems ICD-10 Pressure ulcer of left buttock, stage  4 Cerebral palsy, unspecified Osteomyelitis, unspecified Plan Discharge From [facility] Services: Discharge from [place]! Your wound is healed! 04/18/2022: His wounds are healed. Remains in the excellent care of his sister and I reiterated that she should continue to maintain offloading of pressure, good protein intake, and protecting his skin from moisture. We will discharge him from the wound care center. He may follow-up as needed. Electronic Signature(s) Signed: 04/18/2022 1:22:20 PM By: Fredirick Maudlin MD FACS Entered By: Fredirick Maudlin on 04/18/2022 13:22:19 HxROS Details -------------------------------------------------------------------------------- [name] ([id number] Page 5 of 6 Patient Name: Date of Service: [name] 04/18/2022 12:45 PM Medical Record Number: [id number] Patient Account Number: [id number] Date of Birth/Sex: Treating RN: Jan 07, 1961 (62 y.o. M) Primary Care Provider: Caren Macadam Other Clinician: Referring Provider:  Treating Provider/Extender: Ellis Parents in Treatment: 11 Information Obtained From Patient Eyes Medical History: Negative for: Cataracts; Glaucoma; Optic Neuritis Ear/Nose/Mouth/Throat Medical History: Negative for: Chronic sinus problems/congestion; Middle ear problems Hematologic/Lymphatic Medical History: Negative for: Anemia; Hemophilia; Human Immunodeficiency Virus; Lymphedema; Sickle Cell Disease Respiratory Medical History: Negative for: Aspiration; Asthma; Chronic Obstructive Pulmonary Disease (COPD); Pneumothorax; Sleep Apnea; Tuberculosis Cardiovascular Medical History: Negative for: Angina; Arrhythmia; Congestive Heart Failure; Coronary Artery Disease; Deep Vein Thrombosis; Hypertension; Hypotension; Myocardial Infarction; Peripheral Arterial Disease; Peripheral Venous Disease; Phlebitis; Vasculitis Endocrine Medical  History: Negative for: Type I Diabetes; Type II Diabetes Genitourinary Medical History: Past Medical History Notes: nephrolithiasis nonfunctioning kidney Integumentary (Skin) Medical History: Negative for: History of Burn Neurologic Medical History: Past Medical History Notes: cerebral palsy Oncologic Medical History: Negative for: Received Chemotherapy; Received Radiation Psychiatric Medical History: Negative for: Anorexia/bulimia Immunizations Pneumococcal Vaccine: Received Pneumococcal Vaccination: Yes Received Pneumococcal Vaccination On or After 60th Birthday: No Implantable Devices None [name], [name] ([id number]) [id number].pdf Page 6 of 6 Family and Social History Cancer: Yes - Father,Maternal Grandparents; Diabetes: Yes - Mother; Heart Disease: Yes - Mother; Hereditary Spherocytosis: No; Hypertension: Yes - Siblings,Father,Mother; Kidney Disease: No; Lung Disease: No; Seizures: No; Stroke: No; Thyroid Problems: No; Tuberculosis: No; Never smoker; Marital Status - Single; Alcohol Use: Never; Drug Use: No History; Caffeine Use: Daily; Financial Concerns: No; Food, Clothing or Shelter Needs: No; Support System Lacking: No; Transportation Concerns: No Engineer, maintenance) Signed: 04/18/2022 1:23:06 PM By: Fredirick Maudlin MD FACS Entered By: Fredirick Maudlin on 04/18/2022 13:19:52 -------------------------------------------------------------------------------- SuperBill Details Patient Name: Date of Service: [name], Brett BERT Wise. 04/18/2022 Medical Record Number: [id number] Patient Account Number: [id number] Date of Birth/Sex: Treating RN: 1960-07-15 (62 y.o. M) Primary Care Provider: Caren Macadam Other Clinician: Referring Provider: Treating Provider/Extender: Ellis Parents in Treatment: 11 Diagnosis Coding ICD-10 Codes Code Description [phone] Pressure ulcer of left buttock, stage 4 G80.9 Cerebral palsy,  unspecified M86.9 Osteomyelitis, unspecified Facility Procedures : CPT4 Code: [id number] Description: 99213 - WOUND CARE VISIT-LEV 3 EST PT Modifier: Quantity: 1 Physician Procedures : CPT4 Code Description Modifier [id number] - WC PHYS LEVEL 2 - EST PT ICD-10 Diagnosis Description 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, stage 4 G80.9 Cerebral palsy, unspecified M86.9 Osteomyelitis, unspecified Quantity: 1 Electronic Signature(s) Signed: 04/18/2022 4:48:17 PM By: Dellie Catholic RN Signed: 04/21/2022 12:01:26 PM By: Fredirick Maudlin MD FACS Previous Signature: 04/18/2022 1:22:52 PM Version By: Fredirick Maudlin MD FACS Entered By: Dellie Catholic on 04/18/2022 16:45:49

## 2022-04-19 NOTE — Progress Notes (Signed)
BRAEDAN, VARIN ([id number]) [phone].pdf Page 1 of 6 Visit Report for 04/18/2022 Arrival Information Details Patient Name: Date of Service: [name] 04/18/2022 12:45 PM Medical Record Number: [id number] Patient Account Number: [id number] Date of Birth/Sex: Treating RN: 1960-12-20 (62 y.o. Collene Gobble Primary Care Chloris Marcoux: Caren Macadam Other Clinician: Referring Sally-Ann Cutbirth: Treating Culley Hedeen/Extender: Ellis Parents in Treatment: 11 Visit Information History Since Last Visit Added or deleted any medications: No Patient Arrived: Wheel Chair Any new allergies or adverse reactions: No Arrival Time: 12:48 Had a fall or experienced change in No Accompanied By: sister activities of daily living that may affect Transfer Assistance: Manual risk of falls: Patient Identification Verified: Yes Signs or symptoms of abuse/neglect since last visito No Patient Requires Transmission-Based Precautions: No Hospitalized since last visit: No Patient Has Alerts: No Has Dressing in Place as Prescribed: Yes Pain Present Now: Yes Electronic Signature(s) Signed: 04/18/2022 4:48:17 PM By: Dellie Catholic RN Entered By: Dellie Catholic on 04/18/2022 16:47:22 -------------------------------------------------------------------------------- Clinic Level of Care Assessment Details Patient Name: Date of Service: [name] 04/18/2022 12:45 PM Medical Record Number: [id number] Patient Account Number: [id number] Date of Birth/Sex: Treating RN: [birth year] (62 y.o. Collene Gobble Primary Care Krist Rosenboom: Caren Macadam Other Clinician: Referring Tyshell Ramberg: Treating Evadean Sproule/Extender: Ellis Parents in Treatment: 11 Clinic Level of Care Assessment Items TOOL 4 Quantity Score X- 1 0 Use when only an EandM is performed on FOLLOW-UP visit ASSESSMENTS - Nursing Assessment / Reassessment X- 1 10 Reassessment of  Co-morbidities (includes updates in patient status) X- 1 5 Reassessment of Adherence to Treatment Plan ASSESSMENTS - Wound and Skin A ssessment / Reassessment X - Simple Wound Assessment / Reassessment - one wound 1 5 '[]'$  - 0 Complex Wound Assessment / Reassessment - multiple wounds '[]'$  - 0 Dermatologic / Skin Assessment (not related to wound area) ASSESSMENTS - Focused Assessment '[]'$  - 0 Circumferential Edema Measurements - multi extremities '[]'$  - 0 Nutritional Assessment / Counseling / Intervention '[]'$  - 0 Lower Extremity Assessment (monofilament, tuning fork, pulses) '[]'$  - 0 Peripheral Arterial Disease Assessment (using hand held doppler) ASSESSMENTS - Ostomy and/or Continence Assessment and Care '[]'$  - 0 Incontinence Assessment and Management '[]'$  - 0 Ostomy Care Assessment and Management (repouching, etc.) PROCESS - Coordination of Care X - Simple Patient / Family Education for ongoing care 1 15 '[]'$  - 0 Complex (extensive) Patient / Family Education for ongoing care [name], [name] ([id number]) [phone].pdf Page 2 of 6 X- 1 10 Staff obtains Consents, Records, T Results / Process Orders est X- 1 10 Staff telephones HHA, Nursing Homes / Clarify orders / etc '[]'$  - 0 Routine Transfer to another Facility (non-emergent condition) '[]'$  - 0 Routine Hospital Admission (non-emergent condition) '[]'$  - 0 New Admissions / Biomedical engineer / Ordering NPWT Apligraf, etc. , '[]'$  - 0 Emergency Hospital Admission (emergent condition) X- 1 10 Simple Discharge Coordination '[]'$  - 0 Complex (extensive) Discharge Coordination PROCESS - Special Needs '[]'$  - 0 Pediatric / Minor Patient Management '[]'$  - 0 Isolation Patient Management '[]'$  - 0 Hearing / Language / Visual special needs '[]'$  - 0 Assessment of Community assistance (transportation, D/C planning, etc.) '[]'$  - 0 Additional assistance / Altered mentation '[]'$  - 0 Support Surface(s) Assessment (bed, cushion, seat,  etc.) INTERVENTIONS - Wound Cleansing / Measurement X - Simple Wound Cleansing - one wound 1 5 '[]'$  - 0 Complex Wound Cleansing - multiple wounds X- 1 5 Wound Imaging (photographs - any  number of wounds) '[]'$  - 0 Wound Tracing (instead of photographs) X- 1 5 Simple Wound Measurement - one wound '[]'$  - 0 Complex Wound Measurement - multiple wounds INTERVENTIONS - Wound Dressings X - Small Wound Dressing one or multiple wounds 1 10 '[]'$  - 0 Medium Wound Dressing one or multiple wounds '[]'$  - 0 Large Wound Dressing one or multiple wounds '[]'$  - 0 Application of Medications - topical '[]'$  - 0 Application of Medications - injection INTERVENTIONS - Miscellaneous '[]'$  - 0 External ear exam '[]'$  - 0 Specimen Collection (cultures, biopsies, blood, body fluids, etc.) '[]'$  - 0 Specimen(s) / Culture(s) sent or taken to Lab for analysis '[]'$  - 0 Patient Transfer (multiple staff / Civil Service fast streamer / Similar devices) '[]'$  - 0 Simple Staple / Suture removal (25 or less) '[]'$  - 0 Complex Staple / Suture removal (26 or more) '[]'$  - 0 Hypo / Hyperglycemic Management (close monitor of Blood Glucose) '[]'$  - 0 Ankle / Brachial Index (ABI) - do not check if billed separately X- 1 5 Vital Signs Has the patient been seen at the hospital within the last three years: Yes Total Score: 95 Level Of Care: New/Established - Level 3 Electronic Signature(s) Signed: 04/18/2022 4:48:17 PM By: Dellie Catholic RN Entered By: Dellie Catholic on 04/18/2022 16:45:29 [name], [name] ([id number].pdf Page 3 of 6 -------------------------------------------------------------------------------- Encounter Discharge Information Details Patient Name: Date of Service: [name] 04/18/2022 12:45 PM Medical Record Number: [id number] Patient Account Number: [id number] Date of Birth/Sex: Treating RN: 11-14-1960 (62 y.o. Collene Gobble Primary Care Particia Strahm: Caren Macadam Other Clinician: Referring  Aadon Gorelik: Treating Lindi Abram/Extender: Ellis Parents in Treatment: 11 Encounter Discharge Information Items Discharge Condition: Stable Ambulatory Status: Wheelchair Discharge Destination: Home Transportation: Private Auto Accompanied By: sister and brother-in-law Schedule Follow-up Appointment: Yes Clinical Summary of Care: Patient Declined Electronic Signature(s) Signed: 04/18/2022 4:48:17 PM By: Dellie Catholic RN Entered By: Dellie Catholic on 04/18/2022 16:46:53 -------------------------------------------------------------------------------- Lower Extremity Assessment Details Patient Name: Date of Service: [name], [name] 04/18/2022 12:45 PM Medical Record Number: [id number] Patient Account Number: [id number] Date of Birth/Sex: Treating RN: 1960-04-25 (62 y.o. Collene Gobble Primary Care Brittnye Josephs: Caren Macadam Other Clinician: Referring Sharonica Kraszewski: Treating Deniqua Perry/Extender: Ellis Parents in Treatment: 11 Electronic Signature(s) Signed: 04/18/2022 4:48:17 PM By: Dellie Catholic RN Entered By: Dellie Catholic on 04/18/2022 12:55:32 -------------------------------------------------------------------------------- Multi Wound Chart Details Patient Name: Date of Service: [name], Brett BERT Wise. 04/18/2022 12:45 PM Medical Record Number: [id number] Patient Account Number: [id number] Date of Birth/Sex: Treating RN: [birth year] (62 y.o. M) Primary Care Quayshawn Nin: Caren Macadam Other Clinician: Referring Marjan Rosman: Treating Yasmin Bronaugh/Extender: Ellis Parents in Treatment: 11 Vital Signs Height(in): 68 Pulse(bpm): 96 Weight(lbs): 115 Blood Pressure(mmHg): 128/77 Body Mass Index(BMI): 17.5 Temperature(F): 97.7 Respiratory Rate(breaths/min): 18 [2:Photos:] [N/A:N/A] Left Ischial Tuberosity N/A N/A Wound Location: Pressure Injury N/A N/A Wounding Event: Acute Osteomyelitis N/A N/A Primary Etiology: [name], [name] ([id number]) [phone].pdf Page 4 of 6 11/30/2021 N/A N/A Date Acquired: 61 N/A N/A Weeks of Treatment: Healed - Epithelialized N/A N/A Wound Status: No N/A N/A Wound Recurrence: 0x0x0 N/A N/A Measurements Wise x W x D (cm) 0 N/A N/A A (cm) : rea 0 N/A N/A Volume (cm) : 100.00% N/A N/A % Reduction in Area: 100.00% N/A N/A % Reduction in Volume: Full Thickness Without Exposed N/A N/A Classification: Support Structures None Present N/A N/A Exudate Amount: Distinct, outline attached N/A N/A Wound Margin: None Present (0%) N/A N/A  Granulation Amount: None Present (0%) N/A N/A Necrotic Amount: Fascia: No N/A N/A Exposed Structures: Fat Layer (Subcutaneous Tissue): No Tendon: No Muscle: No Joint: No Bone: No Large (67-100%) N/A N/A Epithelialization: Rash: Yes N/A N/A Periwound Skin Texture: Excoriation: No Maceration: No N/A N/A Periwound Skin Moisture: Dry/Scaly: No Erythema: Yes N/A N/A Periwound Skin Color: No Abnormality N/A N/A Temperature: Treatment Notes Electronic Signature(s) Signed: 04/18/2022 1:19:11 PM By: Fredirick Maudlin MD FACS Entered By: Fredirick Maudlin on 04/18/2022 13:19:11 -------------------------------------------------------------------------------- Multi-Disciplinary Care Plan Details Patient Name: Date of Service: [name], Brett BERT Wise. 04/18/2022 12:45 PM Medical Record Number: [id number] Patient Account Number: [id number] Date of Birth/Sex: Treating RN: [birth year] (62 y.o. Collene Gobble Primary Care Kashara Blocher: Caren Macadam Other Clinician: Referring Joseluis Alessio: Treating Tyrece Vanterpool/Extender: Ellis Parents in Treatment: [place] reviewed with physician Active Inactive Electronic Signature(s) Signed: 04/18/2022 4:48:17 PM By: Dellie Catholic RN Entered By: Dellie Catholic on 04/18/2022  16:44:02 -------------------------------------------------------------------------------- Pain Assessment Details Patient Name: Date of Service: [name], [name] 04/18/2022 12:45 PM Medical Record Number: [id number] Patient Account Number: [id number] Date of Birth/Sex: Treating RN: 1960-10-04 (62 y.o. Collene Gobble Primary Care Jace Dowe: Caren Macadam Other Clinician: Referring Fallynn Gravett: Treating Ladine Kiper/Extender: Ellis Parents in Treatment: 11 Active Problems Location of Pain Severity and Description of Pain Patient Has Paino No Site Locations [name], [name] ([id number]) [id number] Page 5 of 6 Pain Management and Medication Current Pain Management: Electronic Signature(s) Signed: 04/18/2022 4:48:17 PM By: Dellie Catholic RN Entered By: Dellie Catholic on 04/18/2022 12:50:10 -------------------------------------------------------------------------------- Patient/Caregiver Education Details Patient Name: Date of Service: [name], [name] 2/23/2024andnbsp12:45 PM Medical Record Number: [id number] Patient Account Number: [id number] Date of Birth/Gender: Treating RN: 12-09-1960 (62 y.o. Collene Gobble Primary Care Physician: Caren Macadam Other Clinician: Referring Physician: Treating Physician/Extender: Ellis Parents in Treatment: 11 Education Assessment Education Provided To: Patient Education Topics Provided Wound/Skin Impairment: Methods: Explain/Verbal Responses: Return demonstration correctly Electronic Signature(s) Signed: 04/18/2022 4:48:17 PM By: Dellie Catholic RN Entered By: Dellie Catholic on 04/18/2022 16:44:34 -------------------------------------------------------------------------------- Wound Assessment Details Patient Name: Date of Service: [name], Brett BERT Wise. 04/18/2022 12:45 PM Medical Record Number: [id number] Patient Account Number: [id number] Date of Birth/Sex: Treating  RN: 1960-05-12 (62 y.o. Collene Gobble Primary Care Shyhiem Beeney: Caren Macadam Other Clinician: Referring Ariq Khamis: Treating Adrienne Delay/Extender: Ellis Parents in Treatment: 11 Wound Status Wound Number: 2 Primary Etiology: Acute Osteomyelitis Wound Location: Left Ischial Tuberosity Wound Status: Healed - Epithelialized Wounding Event: Pressure Injury Date Acquired: 11/30/2021 Weeks Of Treatment: 11 Clustered Wound: No [name], [name] ([id number]) [phone].pdf Page 6 of 6 Photos Wound Measurements Length: (cm) Width: (cm) Depth: (cm) Area: (cm) Volume: (cm) 0 % Reduction in Area: 100% 0 % Reduction in Volume: 100% 0 Epithelialization: Large (67-100%) 0 Tunneling: No 0 Undermining: No Wound Description Classification: Full Thickness Without Exposed Support Structures Wound Margin: Distinct, outline attached Exudate Amount: None Present Foul Odor After Cleansing: No Slough/Fibrino No Wound Bed Granulation Amount: None Present (0%) Exposed Structure Necrotic Amount: None Present (0%) Fascia Exposed: No Fat Layer (Subcutaneous Tissue) Exposed: No Tendon Exposed: No Muscle Exposed: No Joint Exposed: No Bone Exposed: No Periwound Skin Texture Texture Color No Abnormalities Noted: Yes No Abnormalities Noted: Yes Moisture Temperature / Pain No Abnormalities Noted: Yes Temperature: No Abnormality Electronic Signature(s) Signed: 04/18/2022 4:48:17 PM By: Dellie Catholic RN Entered By: Dellie Catholic on 04/18/2022 13:09:18 -------------------------------------------------------------------------------- Vitals Details Patient Name: Date of Service: Brett  Wise, Brett BERT Wise. 04/18/2022 12:45 PM Medical Record Number: [id number] Patient Account Number: [id number] Date of Birth/Sex: Treating RN: 08-Apr-1960 (62 y.o. Collene Gobble Primary Care Legna Mausolf: Caren Macadam Other Clinician: Referring Raya Mckinstry: Treating  Caytlin Better/Extender: Ellis Parents in Treatment: 11 Vital Signs Time Taken: 12:48 Temperature (F): 97.7 Height (in): 68 Pulse (bpm): 96 Weight (lbs): 115 Respiratory Rate (breaths/min): 18 Body Mass Index (BMI): 17.5 Blood Pressure (mmHg): 128/77 Reference Range: 80 - 120 mg / dl Electronic Signature(s) Signed: 04/18/2022 4:48:17 PM By: Dellie Catholic RN Entered By: Dellie Catholic on 04/18/2022 12:50:03

## 2022-04-22 ENCOUNTER — Other Ambulatory Visit: Payer: Self-pay | Admitting: Gastroenterology

## 2022-04-22 ENCOUNTER — Ambulatory Visit
Admission: RE | Admit: 2022-04-22 | Discharge: 2022-04-22 | Disposition: A | Discharge status: Home / Self Care | Payer: Medicare Other | Source: Ambulatory Visit | Attending: Gastroenterology | Admitting: Gastroenterology

## 2022-04-22 DIAGNOSIS — K5900 Constipation, unspecified: Secondary | ICD-10-CM | POA: Diagnosis not present

## 2022-04-22 DIAGNOSIS — D649 Anemia, unspecified: Secondary | ICD-10-CM | POA: Diagnosis not present

## 2022-04-24 DIAGNOSIS — R339 Retention of urine, unspecified: Secondary | ICD-10-CM | POA: Diagnosis not present

## 2022-04-24 DIAGNOSIS — Z466 Encounter for fitting and adjustment of urinary device: Secondary | ICD-10-CM | POA: Diagnosis not present

## 2022-04-24 DIAGNOSIS — G808 Other cerebral palsy: Secondary | ICD-10-CM | POA: Diagnosis not present

## 2022-04-24 DIAGNOSIS — L89324 Pressure ulcer of left buttock, stage 4: Secondary | ICD-10-CM | POA: Diagnosis not present

## 2022-04-24 DIAGNOSIS — Z87440 Personal history of urinary (tract) infections: Secondary | ICD-10-CM | POA: Diagnosis not present

## 2022-05-01 ENCOUNTER — Ambulatory Visit (INDEPENDENT_AMBULATORY_CARE_PROVIDER_SITE_OTHER): Payer: Medicare Other | Admitting: Urology

## 2022-05-01 DIAGNOSIS — R339 Retention of urine, unspecified: Secondary | ICD-10-CM | POA: Diagnosis not present

## 2022-05-01 NOTE — Progress Notes (Signed)
Cath Change/ Replacement  Patient is present today for a catheter change due to urinary retention.  10 ml of water was removed from the balloon, a 16 FR foley cath was removed without difficulty.  Patient was cleaned and prepped in a sterile fashion with betadine. A 16 FR foley cath was replaced into the bladder, no complications were noted. Urine return was noted 38m and urine was yellow in color. The balloon was filled with 128mof sterile water. A overnight bag was attached for drainage.  A night bag was also given to the patient and patient was given instruction on how to change from one bag to another. Patient was given proper instruction on catheter care.    Performed by: ShMarisue BrooklynCMA  Follow up: Keep [place]

## 2022-05-08 DIAGNOSIS — L89212 Pressure ulcer of right hip, stage 2: Secondary | ICD-10-CM | POA: Diagnosis not present

## 2022-05-08 DIAGNOSIS — G809 Cerebral palsy, unspecified: Secondary | ICD-10-CM | POA: Diagnosis not present

## 2022-05-19 ENCOUNTER — Other Ambulatory Visit: Payer: Self-pay | Admitting: Gastroenterology

## 2022-05-19 ENCOUNTER — Ambulatory Visit
Admission: RE | Admit: 2022-05-19 | Discharge: 2022-05-19 | Disposition: A | Discharge status: Home / Self Care | Payer: Medicare Other | Source: Ambulatory Visit | Attending: Gastroenterology | Admitting: Gastroenterology

## 2022-05-19 DIAGNOSIS — K5900 Constipation, unspecified: Secondary | ICD-10-CM

## 2022-06-02 ENCOUNTER — Ambulatory Visit: Payer: Medicare Other

## 2022-06-03 ENCOUNTER — Ambulatory Visit (INDEPENDENT_AMBULATORY_CARE_PROVIDER_SITE_OTHER): Payer: Medicare Other | Admitting: Urology

## 2022-06-03 DIAGNOSIS — R339 Retention of urine, unspecified: Secondary | ICD-10-CM | POA: Diagnosis not present

## 2022-06-03 NOTE — Progress Notes (Signed)
Cath Change/ Replacement  Patient is present today for a catheter change due to urinary retention.  10ml of water was removed from the balloon, a 16FR foley cath was removed without difficulty.  Patient was cleaned and prepped in a sterile fashion with betadine and 2% lidocaine jelly was instilled into the urethra. A 16 FR foley cath was replaced into the bladder, no complications were noted. Urine return was noted 10ml and urine was yellow in color. The balloon was filled with 10ml of sterile water. A bed bag was attached for drainage.  A night bag was also given to the patient and patient was given instruction on how to change from one bag to another. Patient was given proper instruction on catheter care.    Performed by: Swade Shonka, CMA  Follow up: Follow up as scheduled.   

## 2022-06-08 DIAGNOSIS — L89212 Pressure ulcer of right hip, stage 2: Secondary | ICD-10-CM | POA: Diagnosis not present

## 2022-06-08 DIAGNOSIS — G809 Cerebral palsy, unspecified: Secondary | ICD-10-CM | POA: Diagnosis not present

## 2022-07-02 ENCOUNTER — Ambulatory Visit: Payer: Medicare Other

## 2022-07-02 ENCOUNTER — Ambulatory Visit (INDEPENDENT_AMBULATORY_CARE_PROVIDER_SITE_OTHER): Payer: Medicare Other

## 2022-07-02 DIAGNOSIS — R339 Retention of urine, unspecified: Secondary | ICD-10-CM | POA: Diagnosis not present

## 2022-07-02 NOTE — Addendum Note (Signed)
Addended by: Christoper Fabian R on: 07/02/2022 02:52 PM   Modules accepted: Orders

## 2022-07-02 NOTE — Progress Notes (Signed)
Cath Change/ Replacement  Patient is present today for a catheter change due to urinary retention.  10 ml of water was removed from the balloon, a 16 FR foley cath was removed without difficulty.  Patient was cleaned and prepped in a sterile fashion with betadine and 2% lidocaine jelly was instilled into the urethra. A 16  FR foley cath was replaced into the bladder, no complications were noted. Urine return was noted 5 ml and urine was yellow in color. The balloon was filled with 10ml of sterile water. A night bag was attached for drainage.  A night bag was also given to the patient and patient was given instruction on how to change from one bag to another. Patient was given proper instruction on catheter care.    Performed by: Jhanae Jaskowiak, CMA  Follow up: No follow-ups on file.   

## 2022-07-08 DIAGNOSIS — L89212 Pressure ulcer of right hip, stage 2: Secondary | ICD-10-CM | POA: Diagnosis not present

## 2022-07-08 DIAGNOSIS — G809 Cerebral palsy, unspecified: Secondary | ICD-10-CM | POA: Diagnosis not present

## 2022-07-09 ENCOUNTER — Ambulatory Visit: Payer: Medicare Other

## 2022-08-07 ENCOUNTER — Ambulatory Visit: Payer: Medicare Other
